# Patient Record
Sex: Female | Born: 1990 | Race: White | Hispanic: No | Marital: Married | Smoking: Never smoker
Health system: Southern US, Community
[De-identification: ages and names within clinical notes are randomized; demographics above are authoritative.]

## PROBLEM LIST (undated history)

## (undated) DIAGNOSIS — F419 Anxiety disorder, unspecified: Secondary | ICD-10-CM

## (undated) DIAGNOSIS — F329 Major depressive disorder, single episode, unspecified: Secondary | ICD-10-CM

## (undated) DIAGNOSIS — T7840XA Allergy, unspecified, initial encounter: Secondary | ICD-10-CM

## (undated) DIAGNOSIS — N200 Calculus of kidney: Secondary | ICD-10-CM

## (undated) DIAGNOSIS — E119 Type 2 diabetes mellitus without complications: Secondary | ICD-10-CM

## (undated) DIAGNOSIS — D649 Anemia, unspecified: Secondary | ICD-10-CM

## (undated) DIAGNOSIS — G43909 Migraine, unspecified, not intractable, without status migrainosus: Secondary | ICD-10-CM

## (undated) DIAGNOSIS — F32A Depression, unspecified: Secondary | ICD-10-CM

## (undated) HISTORY — DX: Anemia, unspecified: D64.9

## (undated) HISTORY — DX: Allergy, unspecified, initial encounter: T78.40XA

## (undated) HISTORY — DX: Major depressive disorder, single episode, unspecified: F32.9

## (undated) HISTORY — DX: Calculus of kidney: N20.0

## (undated) HISTORY — DX: Anxiety disorder, unspecified: F41.9

## (undated) HISTORY — DX: Migraine, unspecified, not intractable, without status migrainosus: G43.909

## (undated) HISTORY — DX: Depression, unspecified: F32.A

---

## 2010-02-07 ENCOUNTER — Emergency Department (INDEPENDENT_AMBULATORY_CARE_PROVIDER_SITE_OTHER)
Admission: EM | Admit: 2010-02-07 | Discharge: 2010-02-07 | Payer: Self-pay | Source: Home / Self Care | Admitting: Emergency Medicine

## 2010-02-07 DIAGNOSIS — R1031 Right lower quadrant pain: Secondary | ICD-10-CM

## 2010-02-07 DIAGNOSIS — M549 Dorsalgia, unspecified: Secondary | ICD-10-CM

## 2010-02-07 LAB — URINALYSIS, ROUTINE W REFLEX MICROSCOPIC
Ketones, ur: 40 mg/dL — AB
Nitrite: POSITIVE — AB
Protein, ur: 100 mg/dL — AB
Urobilinogen, UA: 1 mg/dL (ref 0.0–1.0)

## 2010-02-07 LAB — CBC
HCT: 36.7 % (ref 36.0–46.0)
MCH: 26.6 pg (ref 26.0–34.0)
MCHC: 33.5 g/dL (ref 30.0–36.0)
RDW: 14.4 % (ref 11.5–15.5)

## 2010-02-07 LAB — DIFFERENTIAL
Basophils Absolute: 0 10*3/uL (ref 0.0–0.1)
Lymphs Abs: 3.7 10*3/uL (ref 0.7–4.0)
Monocytes Absolute: 1.1 10*3/uL — ABNORMAL HIGH (ref 0.1–1.0)

## 2010-02-07 LAB — BASIC METABOLIC PANEL
Calcium: 9.4 mg/dL (ref 8.4–10.5)
Creatinine, Ser: 0.7 mg/dL (ref 0.4–1.2)
GFR calc non Af Amer: >60 mL/min (ref >60)
Glucose, Bld: 123 mg/dL — ABNORMAL HIGH (ref 70–99)
Sodium: 143 mEq/L (ref 135–145)

## 2010-02-07 LAB — URINE MICROSCOPIC-ADD ON

## 2011-02-03 ENCOUNTER — Emergency Department (HOSPITAL_BASED_OUTPATIENT_CLINIC_OR_DEPARTMENT_OTHER)
Admission: EM | Admit: 2011-02-03 | Discharge: 2011-02-03 | Disposition: A | Discharge status: Home / Self Care | Payer: Medicaid Other | Attending: Emergency Medicine | Admitting: Emergency Medicine

## 2011-02-03 ENCOUNTER — Encounter (HOSPITAL_BASED_OUTPATIENT_CLINIC_OR_DEPARTMENT_OTHER): Payer: Self-pay | Admitting: Family Medicine

## 2011-02-03 DIAGNOSIS — R319 Hematuria, unspecified: Secondary | ICD-10-CM | POA: Insufficient documentation

## 2011-02-03 DIAGNOSIS — R21 Rash and other nonspecific skin eruption: Secondary | ICD-10-CM | POA: Insufficient documentation

## 2011-02-03 DIAGNOSIS — Z2089 Contact with and (suspected) exposure to other communicable diseases: Secondary | ICD-10-CM | POA: Insufficient documentation

## 2011-02-03 MED ORDER — DIPHENHYDRAMINE HCL 25 MG PO CAPS
25.0000 mg | ORAL_CAPSULE | Freq: Once | ORAL | Status: DC
  Filled 2011-02-03: qty 1

## 2011-02-03 MED ORDER — DIPHENHYDRAMINE HCL 25 MG PO TABS: 25.0000 mg | ORAL_TABLET | Freq: Four times a day (QID) | ORAL | Status: AC

## 2011-02-03 MED ORDER — PERMETHRIN 5 % EX CREA: TOPICAL_CREAM | Freq: Once | CUTANEOUS | Status: AC

## 2011-02-03 NOTE — ED Provider Notes (Signed)
History     CSN: [id number]  Arrival date & time 02/03/11  4098   First MD Initiated Contact with Patient 02/03/11 [phone]      Chief Complaint  Patient presents with  . Rash    (Consider location/radiation/quality/duration/timing/severity/associated sxs/prior treatment) HPI Comments: Diffuse body itching for the past 2 weeks. Patient's roommate had scabies and was treated. Patient denies any fevers, vomiting, difficulty breathing or swallowing. Few scattered papules but no localized rash. No genital or mucous membrane involvement  Patient is a 21 y.o. female presenting with rash. The history is provided by the patient.  Rash  This is a new problem. The current episode started more than 1 week ago. The problem has not changed since onset.There has been no fever. The patient is experiencing no pain. Associated symptoms include itching. She has tried nothing for the symptoms.    History reviewed. No pertinent past medical history.  History reviewed. No pertinent past surgical history.  No family history on file.  History  Substance Use Topics  . Smoking status: Never Smoker   . Smokeless tobacco: Not on file  . Alcohol Use: No    OB History    Grav Para Term Preterm Abortions TAB SAB Ect Mult Living                  Review of Systems  Constitutional: Negative for fever, chills and activity change.  HENT: Negative for trouble swallowing.   Cardiovascular: Negative for chest pain.  Gastrointestinal: Negative for nausea and vomiting.  Genitourinary: Positive for hematuria. Negative for dysuria.  Skin: Positive for itching and rash.  Neurological: Negative for headaches.  Hematological: Negative for adenopathy.    Allergies  Review of patient's allergies indicates no known allergies.  Home Medications   Current Outpatient Rx  Name Route Sig Dispense Refill  . DIPHENHYDRAMINE HCL 25 MG PO TABS Oral Take 1 tablet (25 mg total) by mouth every 6 (six) hours. 20 tablet 0    . PERMETHRIN 5 % EX CREA Topical Apply topically once. 60 g 0    BP 139/72  Pulse 79  Temp(Src) 98.1 F (36.7 C) (Oral)  Resp 16  Ht 5\' 8"  (1.727 m)  Wt 215 lb (97.523 kg)  BMI 32.69 kg/m2  SpO2 100%  LMP 01/20/2011  Physical Exam  Constitutional: She is oriented to person, place, and time. She appears well-developed and well-nourished. No distress.  HENT:  Head: Normocephalic and atraumatic.  Mouth/Throat: Oropharynx is clear and moist. No oropharyngeal exudate.  Eyes: Conjunctivae are normal. Pupils are equal, round, and reactive to light.  Neck: Normal range of motion. Neck supple.  Cardiovascular: Normal rate, regular rhythm and normal heart sounds.   Pulmonary/Chest: Effort normal and breath sounds normal. No respiratory distress.  Abdominal: Soft. There is no tenderness. There is no rebound and no guarding.  Musculoskeletal: Normal range of motion. She exhibits no edema and no tenderness.  Neurological: She is alert and oriented to person, place, and time. No cranial nerve deficit.  Skin: Skin is warm. Rash noted.       Erythematous areas of scratching. Few scattered papules. No rash or burrows seen in intertriginous areas    ED Course  Procedures (including critical care time)  Labs Reviewed - No data to display No results found.   1. Scabies exposure       MDM  Diffuse body itching with exposure to scabies. Will treat. No urticaria, wheezing, airway involvement.  Benadryl prn.  Glynn Octave, MD 02/03/11 [phone]

## 2011-02-03 NOTE — ED Notes (Signed)
Pt c/o itching all over x 1 wk. Pt sts roommate had scabies.

## 2012-12-09 IMAGING — CT CT ABD-PELV W/O CM
2 of 4 series · 16 of 46 positions shown, 18 images · non-contrast
Comparison: None.

CLINICAL DATA: Severe right lower quadrant pain, spotting, nausea

CT ABDOMEN AND PELVIS WITHOUT CONTRAST
TECHNIQUE: Multidetector CT imaging of the abdomen and pelvis was
performed following the standard protocol without intravenous
contrast.

[Series 2: renal stone < 200 lbs 5.0 b31f · axial · 0.77mm/px · z∈[-504,-79]mm · 13 of 93 slices shown, 15 images]
[im 4/93  soft-tissue]
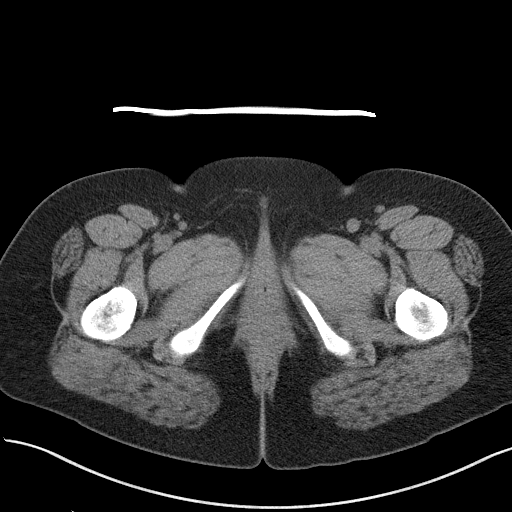
[im 4/93  bone]
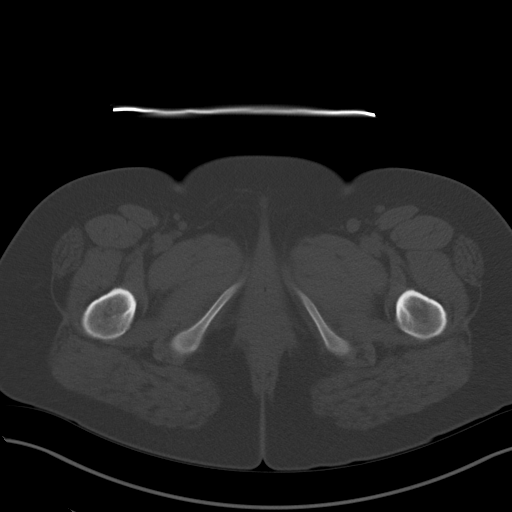
[im 12/93  soft-tissue]
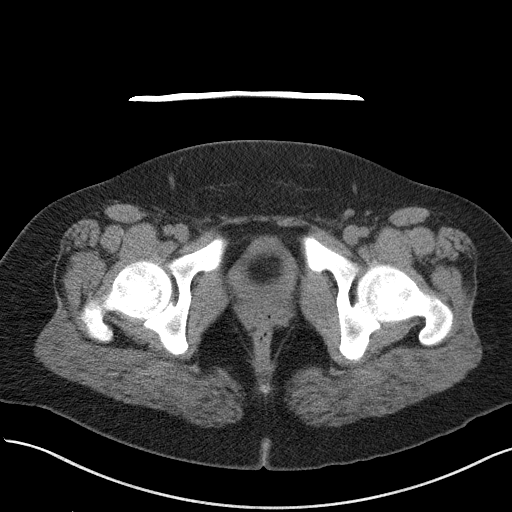
[im 19/93  soft-tissue]
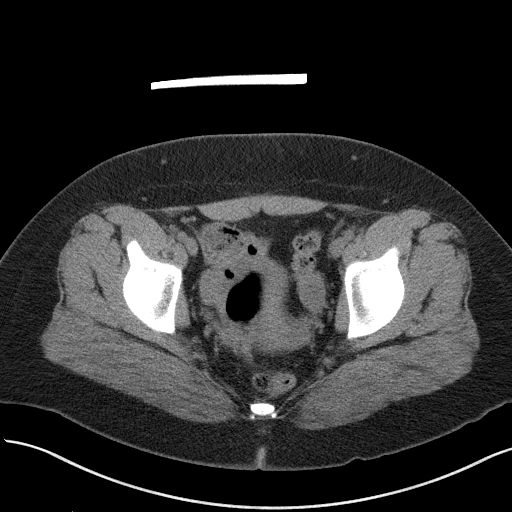
[im 26/93  soft-tissue]
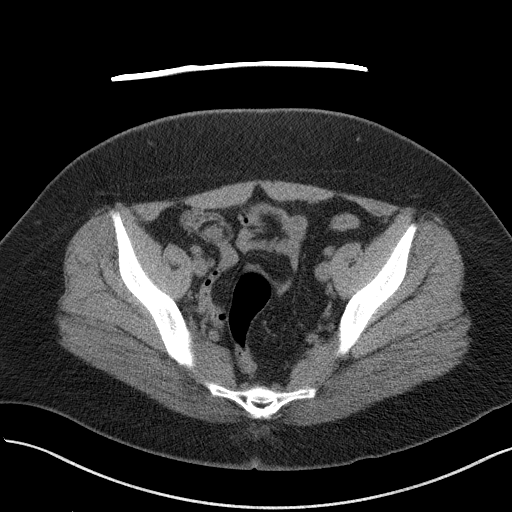
[im 34/93  soft-tissue]
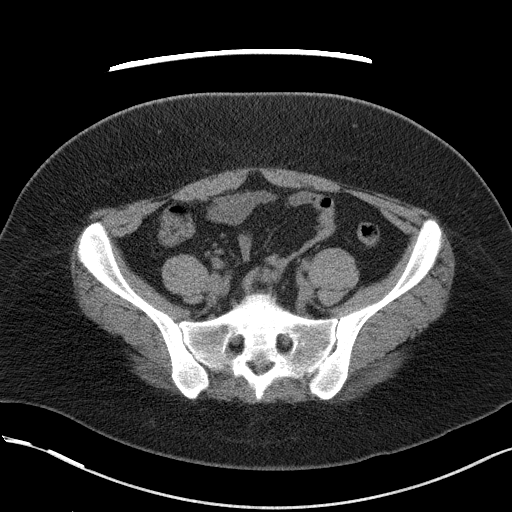
[im 41/93  soft-tissue]
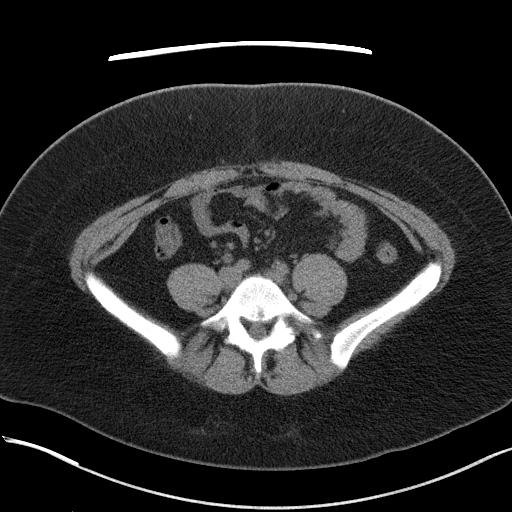
[im 48/93  soft-tissue]
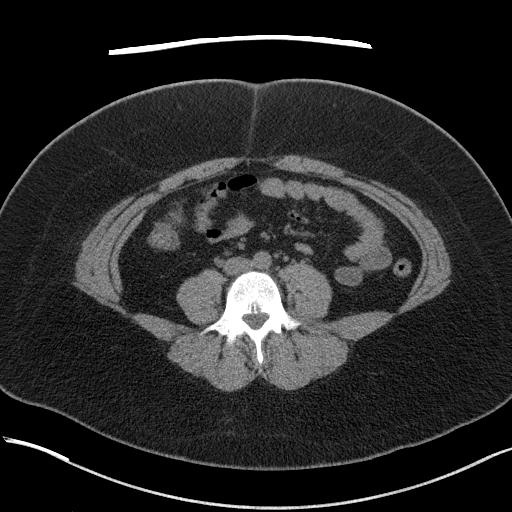
[im 52/93  soft-tissue]
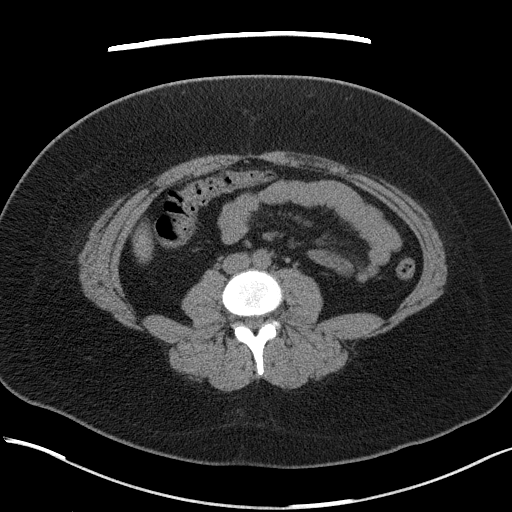
[im 59/93  soft-tissue]
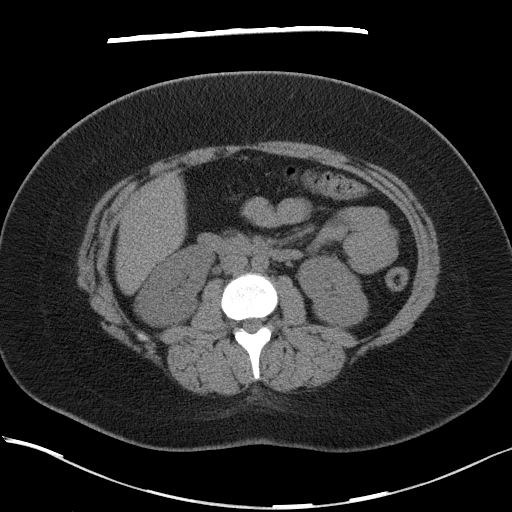
[im 59/93  bone]
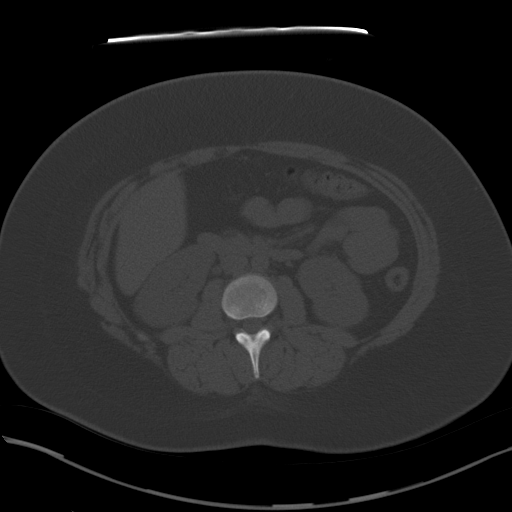
[im 67/93  soft-tissue]
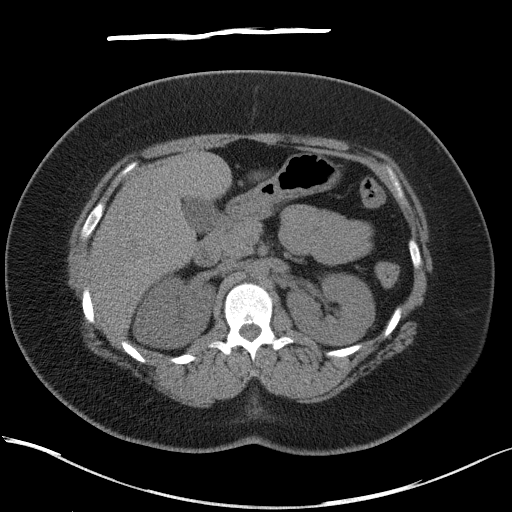
[im 74/93  soft-tissue]
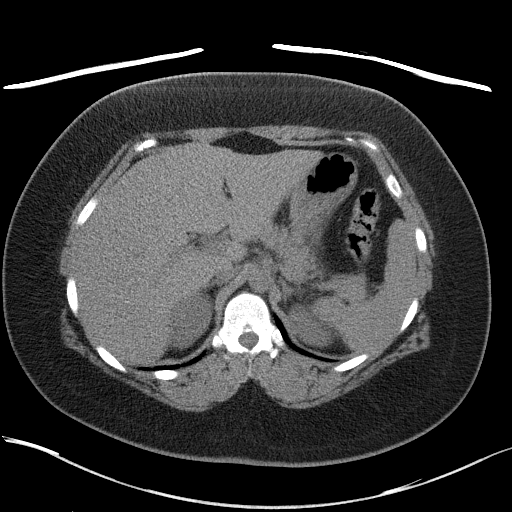
[im 81/93  soft-tissue]
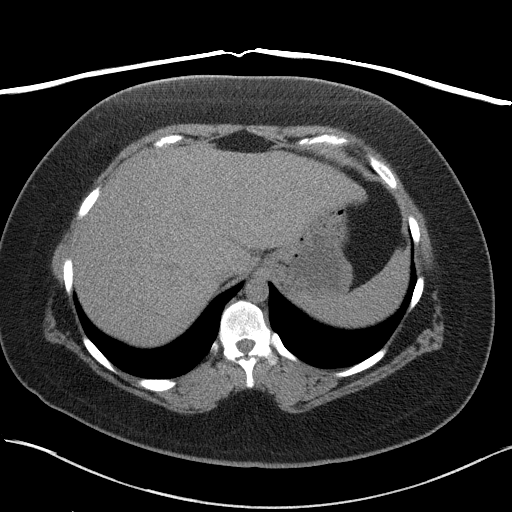
[im 89/93  soft-tissue]
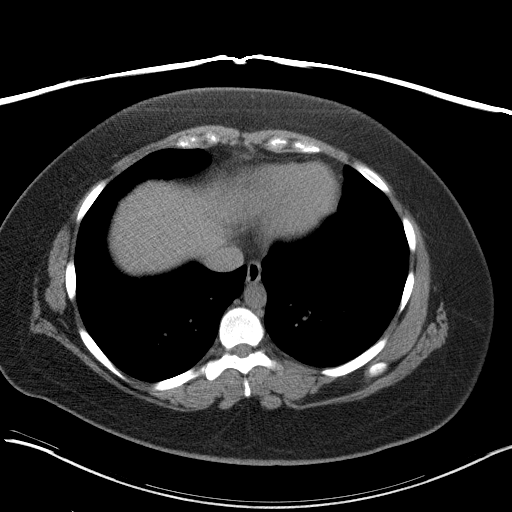

[Series 5: renal stone 3.0 coronal · coronal · 0.95mm/px · 3 of 87 slices shown]
[im 29/87  soft-tissue]
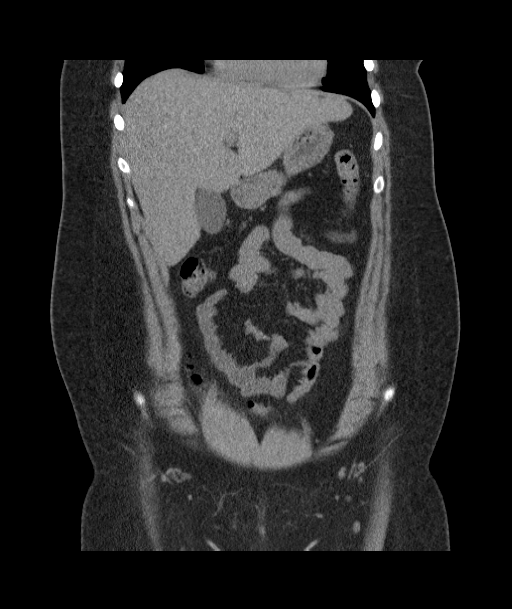
[im 39/87  soft-tissue]
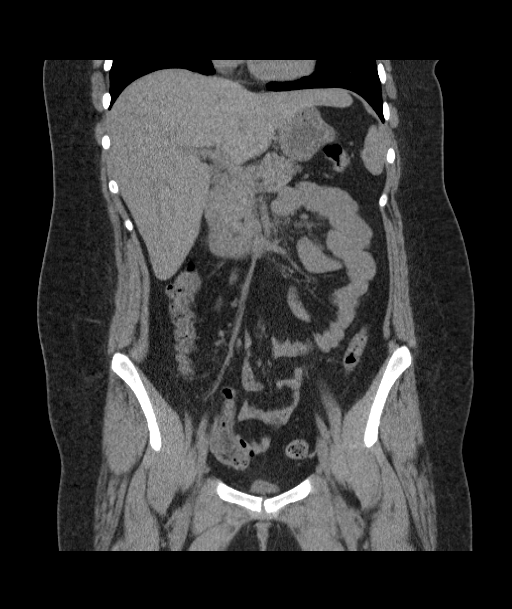
[im 48/87  soft-tissue]
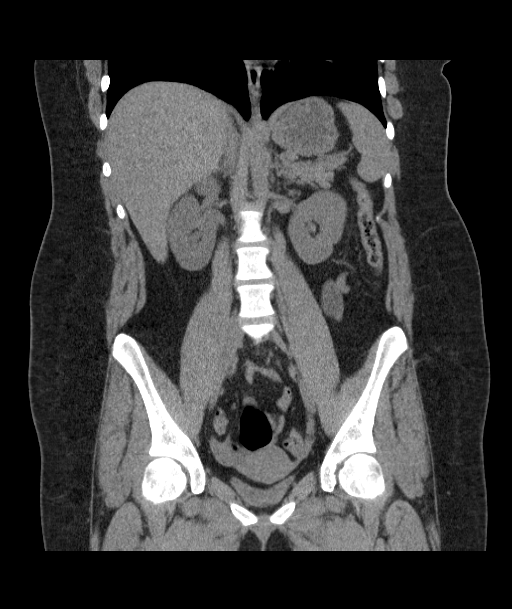

[16 of 46 positions shown; findings below may reference images not displayed]

FINDINGS: The lung bases are clear.  The liver is unremarkable in
the unenhanced state.  No calcified gallstones are seen.  The
pancreas is normal in size and the pancreatic duct is not dilated.
The adrenal glands and spleen are unremarkable.  The stomach is
decompressed.  No renal calculi are seen and there is no evidence
of hydronephrosis.  However, the right ureter is slightly more
prominent than the left.  In the region of the right ureter, very
near the right UV junction, there is a 4 mm calcification present
suspicious for a distal right ureteral calculus.  The urinary
bladder is unfortunately very decompressed.

The uterus is normal in size.  No adnexal lesion is seen.  No free
fluid is noted within the pelvis.  The appendix is not definitely
seen.  However there is no CT evidence of appendicitis.  The
terminal ileum is unremarkable.  No abnormality of the colon is
noted, with the cecum very low lying in the right lower anterior
pelvis.  No bony abnormality is noted.
IMPRESSION: 1.  There is slight fullness of the right ureter to a point of
probable low grade obstruction by a 4 mm distal right ureteral
calculus very near the right UV junction.  The urinary bladder is
unfortunately very decompressed.
2.  No other renal calculi are seen.
3.  The appendix is not visualized but there is no CT evidence of
appendicitis.
4.  No adnexal lesion or free fluid is seen.

## 2015-10-20 ENCOUNTER — Encounter: Payer: Self-pay | Admitting: Family Medicine

## 2015-10-20 ENCOUNTER — Ambulatory Visit (INDEPENDENT_AMBULATORY_CARE_PROVIDER_SITE_OTHER): Payer: BC Managed Care – PPO | Admitting: Family Medicine

## 2015-10-20 VITALS — BP 124/78 | HR 91 | Temp 98.4°F | Resp 12 | Ht 68.0 in | Wt 236.0 lb

## 2015-10-20 DIAGNOSIS — Z6835 Body mass index (BMI) 35.0-35.9, adult: Secondary | ICD-10-CM

## 2015-10-20 DIAGNOSIS — F419 Anxiety disorder, unspecified: Secondary | ICD-10-CM | POA: Insufficient documentation

## 2015-10-20 DIAGNOSIS — E669 Obesity, unspecified: Secondary | ICD-10-CM | POA: Insufficient documentation

## 2015-10-20 DIAGNOSIS — F411 Generalized anxiety disorder: Secondary | ICD-10-CM | POA: Diagnosis not present

## 2015-10-20 DIAGNOSIS — Z6838 Body mass index (BMI) 38.0-38.9, adult: Secondary | ICD-10-CM | POA: Insufficient documentation

## 2015-10-20 DIAGNOSIS — G4700 Insomnia, unspecified: Secondary | ICD-10-CM

## 2015-10-20 MED ORDER — FLUOXETINE HCL 20 MG PO CAPS: 20.0000 mg | ORAL_CAPSULE | Freq: Every day | ORAL | 2 refills | Status: DC

## 2015-10-20 NOTE — Progress Notes (Signed)
HPI:   [name] is a 25 y.o. female, who is here today to establish care with me.  Former PCP: N/A Last preventive routine visit: a few years ago. She has not had a pap smear.   Concerns today: anxiety.  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  She has nausea and vomiting when she feels "extremely nervous" She is reporting Hx of depression since she was in high school, at that time she had suicidal thoughts, denies any Hx of suicidal attempt.  Hx of psychiatric hospitalizations or ER visits: Denies Any hx of bipolar disorder: Denies. Suicidal thoughts: Denies. Insomnia: yes, melatonin 5 mg at bedtime helps, exacerbated by anxiety/stress. Tobacco use: No Alcohol abuse: Denies Hx of illicit drug RUE:[id number] FHx for psychiatric disorders: Mother depressed and anxious but she has not been diagnosed.  Medications in the past: None   She lives with husband and a friend. She does not exercise regularly and does not follow a healthful diet consistently.  She is sexually active, not on birth control.   Review of Systems  Constitutional: Negative for activity change, appetite change, fatigue, fever and unexpected weight change.  HENT: Negative for mouth sores, nosebleeds and trouble swallowing.   Eyes: Negative for redness and visual disturbance.  Respiratory: Negative for cough, shortness of breath and wheezing.   Cardiovascular: Negative for chest pain, palpitations and leg swelling.  Gastrointestinal: Positive for nausea and vomiting. Negative for abdominal pain.       Negative for changes in bowel habits.  Musculoskeletal: Negative for back pain and myalgias.  Neurological: Negative for seizures, syncope, weakness, numbness and headaches.  Psychiatric/Behavioral: Positive  for sleep disturbance. Negative for confusion, hallucinations, self-injury and suicidal ideas. The patient is nervous/anxious.       No current outpatient prescriptions on file prior to visit.   No current facility-administered medications on file prior to visit.      Past Medical History:  Diagnosis Date  . Anxiety   . Depression   . Migraine headache   . Nephrolithiasis    No Known Allergies  Family History  Problem Relation Age of Onset  . Mental illness Mother     Social History   Social History  . Marital status: Married    Spouse name: N/A  . Number of children: N/A  . Years of education: N/A   Social History Main Topics  . Smoking status: Never Smoker  . Smokeless tobacco: Never Used  . Alcohol use No  . Drug use: No  . Sexual activity: Yes    Birth control/ protection: None   Other Topics Concern  . None   Social History Narrative  . None    Vitals:   10/20/15 1454  BP: 124/78  Pulse: 91  Resp: 12  Temp: 98.4 F (36.9 C)    Body mass index is 35.88 kg/m.     Physical Exam  Nursing note and vitals reviewed. Constitutional: She is oriented to person, place, and time. She appears well-developed. No distress.  HENT:  Head: Atraumatic.  Mouth/Throat: Oropharynx is clear and moist and mucous membranes are normal.  Eyes: Conjunctivae and EOM are normal. Pupils are equal, round, and reactive to light.  Neck: No thyroid mass and no thyromegaly present.  Cardiovascular: Normal rate and  regular rhythm.   No murmur (? soft SEM RUSB) heard. Pulses:      Dorsalis pedis pulses are 2+ on the right side, and 2+ on the left side.  Respiratory: Effort normal and breath sounds normal. No respiratory distress.  GI: Soft. She exhibits no mass. There is no hepatomegaly. There is no tenderness.  Musculoskeletal: She exhibits no edema or tenderness.  Neurological: She is alert and oriented to person, place, and time. She has normal strength. Coordination  normal.  Skin: Skin is warm. No erythema.  Psychiatric: Her speech is normal. Her mood appears anxious. Her affect is labile. Cognition and memory are normal. She expresses no suicidal ideation. She expresses no suicidal plans.  Well groomed, good eye contact.      ASSESSMENT AND PLAN:     Angela Powers was seen today for establish care.  Diagnoses and all orders for this visit:  Insomnia, unspecified type  Otherwise well controlled with Melatonin. Good sleep hygiene. Monitor for changes when starting SSRI.   BMI 35.0-35.9,adult  We discussed benefits of wt loss as well as adverse effects of obesity. Consistency with healthy diet and physical activity recommended. Small and frequent small meals as well as daily brisk walking for 15-30 min as tolerated.  Generalized anxiety disorder  After discussion of a few treatment options, she agrees with starting Fluoxetine. Some side effects discussed. Avoid pregnancy for now, we discussed some adverse effects of this medication in case of pregnancy. Instructed about warning signs. F/U in 6-8 weeks,before if needed.  -     FLUoxetine (PROZAC) 20 MG capsule; Take 1 capsule (20 mg total) by mouth daily.    -Recommend gyn evaluation for preventive care and counseling about pregnancy since she is interested in getting pregnant.  Daily Folic acid 1 mg.         Betty G. [place], MD  [facility]. [place] office.

## 2015-10-20 NOTE — Patient Instructions (Addendum)
A few things to remember from today's visit:   Generalized anxiety disorder  Insomnia, unspecified type  Monitor for warning signs as we discussed. Regular exercise and a healthy diet recommended.  Daily probiotic might help as well as gluten-free diet.  Please be sure medication list is accurate. If a new problem present, please set up appointment sooner than planned today.

## 2015-10-24 ENCOUNTER — Encounter: Payer: Self-pay | Admitting: Family Medicine

## 2015-10-29 ENCOUNTER — Encounter: Payer: Self-pay | Admitting: Family Medicine

## 2015-10-29 ENCOUNTER — Ambulatory Visit (INDEPENDENT_AMBULATORY_CARE_PROVIDER_SITE_OTHER): Payer: BC Managed Care – PPO | Admitting: Family Medicine

## 2015-10-29 VITALS — BP 132/80 | HR 78 | Resp 12 | Ht 68.0 in | Wt 233.5 lb

## 2015-10-29 DIAGNOSIS — F411 Generalized anxiety disorder: Secondary | ICD-10-CM | POA: Diagnosis not present

## 2015-10-29 MED ORDER — ESCITALOPRAM OXALATE 10 MG PO TABS: 10.0000 mg | ORAL_TABLET | Freq: Every day | ORAL | 0 refills | Status: DC

## 2015-10-29 NOTE — Progress Notes (Signed)
HPI:  ACUTE VISIT:  Chief Complaint  Patient presents with  . Follow-up    medication reaction    [name] is a 25 y.o. female, who is here today with her husband complaining of medication side effects.  Last seen on 10/20/15, when Prozac was started. She was tolerating medication well until 2 days ago when she started with more frequent episodes of panic attacks and crying spells with no evident reason.  She denies any worsening of insomnia or maniac like symptoms.  Yesterday she thought about driving out of the road, suicidal thought. She denies ideation or a plan. No guns in the house an she stays with her husband all the time.  During panic attacks she feels like she cannot breathe, heart racing, and "panicky." Symptoms lasted a few minutes.  She has had history of depression during high school with suicidal thoughts, she denies any history of suicidal attempt or psychiatric hospitalizations.     Review of Systems  Constitutional: Negative for activity change, appetite change, fatigue and unexpected weight change.  HENT: Negative for facial swelling, mouth sores and trouble swallowing.   Respiratory: Positive for shortness of breath. Negative for cough and wheezing.   Cardiovascular: Positive for palpitations. Negative for chest pain and leg swelling.  Gastrointestinal: Negative for abdominal pain, nausea and vomiting.  Neurological: Negative for dizziness, tremors, syncope, weakness, numbness and headaches.  Psychiatric/Behavioral: Negative for agitation, confusion, hallucinations, self-injury and sleep disturbance. The patient is nervous/anxious.       Current Outpatient Prescriptions on File Prior to Visit  Medication Sig Dispense Refill  . Melatonin 5 MG TABS Take 1 tablet by mouth at bedtime.     No current facility-administered medications on file prior to visit.      Past Medical History:  Diagnosis Date  . Anxiety   .  Depression   . Migraine headache   . Nephrolithiasis    No Known Allergies  Social History   Social History  . Marital status: Married    Spouse name: N/A  . Number of children: N/A  . Years of education: N/A   Social History Main Topics  . Smoking status: Never Smoker  . Smokeless tobacco: Never Used  . Alcohol use No  . Drug use: No  . Sexual activity: Yes    Birth control/ protection: None   Other Topics Concern  . None   Social History Narrative  . None    Vitals:   10/29/15 1000  BP: 132/80  Pulse: 78  Resp: 12    O2 sat 98% at RA.  Body mass index is 35.5 kg/m.    Physical Exam  Nursing note and vitals reviewed. Constitutional: She is oriented to person, place, and time. She appears well-developed. No distress.  Eyes: Conjunctivae are normal.  Cardiovascular: Normal rate and regular rhythm.   No murmur heard. Respiratory: Effort normal and breath sounds normal. No respiratory distress.  Neurological: She is alert and oriented to person, place, and time. She has normal strength. Coordination and gait normal.  Skin: Skin is warm. No erythema.  Psychiatric: Her speech is normal. Her mood appears anxious. Her affect is labile. Cognition and memory are normal.  Well groomed, good eye contact.      ASSESSMENT AND PLAN:     Angela Powers was seen today for follow-up.  Diagnoses and all orders for this visit:  Generalized anxiety disorder -     escitalopram (LEXAPRO) 10 MG tablet; Take 1  tablet (10 mg total) by mouth daily.   She denies any suicidal plan or ideation. We discussed other treatment options that included psychiatry/psychology evaluation. She agrees with trying other medication, Lexapro 10 mg daily. We discussed in detail side effects and warning signs as well as when she should seek immediate medical attention. Husband also voices understanding. I will see her back in 2-3 weeks before if needed. Regular exercise and a healthy diet also  encouraged, they might help.    Return in about 3 weeks (around 11/19/2015) for 2-3 wks anxiety/depression.       Betty G. [place], MD  [facility]. [place] office.

## 2015-10-29 NOTE — Progress Notes (Signed)
Pre visit review using our clinic review tool, if applicable. No additional management support is needed unless otherwise documented below in the visit note. 

## 2015-10-29 NOTE — Patient Instructions (Addendum)
A few things to remember from today's visit:   Generalized anxiety disorder - Plan: escitalopram (LEXAPRO) 10 MG tablet  Stop Prozac.  Regular exercise ,gluten free diet, daily Probiotic may also help.  This seek immediate medical attention if suicidal ideation.  Please be sure medication list is accurate. If a new problem present, please set up appointment sooner than planned today.

## 2015-11-19 ENCOUNTER — Ambulatory Visit (INDEPENDENT_AMBULATORY_CARE_PROVIDER_SITE_OTHER): Payer: BC Managed Care – PPO | Admitting: Family Medicine

## 2015-11-19 ENCOUNTER — Encounter: Payer: Self-pay | Admitting: Family Medicine

## 2015-11-19 VITALS — BP 128/80 | HR 80 | Resp 12 | Ht 68.0 in | Wt 233.5 lb

## 2015-11-19 DIAGNOSIS — F411 Generalized anxiety disorder: Secondary | ICD-10-CM

## 2015-11-19 MED ORDER — ESCITALOPRAM OXALATE 10 MG PO TABS: 10.0000 mg | ORAL_TABLET | Freq: Every day | ORAL | 1 refills | Status: DC

## 2015-11-19 NOTE — Patient Instructions (Addendum)
A few things to remember from today's visit:   Generalized anxiety disorder - Plan: escitalopram (LEXAPRO) 10 MG tablet   Please be sure medication list is accurate. If a new problem present, please set up appointment sooner than planned today.

## 2015-11-19 NOTE — Progress Notes (Signed)
HPI:   [name] is a 25 y.o. female, who is here today with her husband to follow on anxiety.  I saw her last on 10/29/2015 because worsening anxiety symptoms after starting Fluoxetine, which was started on 10/29/2015.  She is currently taking Lexapro 10 mg, tolerating medication well, she denies any suicidal thoughts, which she has had in the past. She is reporting greatly improvement of symptoms, she tells me that she quit her job a few days ago. 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 She is planning on starting a healthy diet and regular exercise. 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  Denies changes in sleep pattern or maniac like symptoms.  Concerns today: None     Review of Systems  Constitutional: Negative for appetite change and fatigue.  HENT: Negative for mouth sores and trouble swallowing.   Respiratory: Negative for cough, shortness of breath and wheezing.   Cardiovascular: Negative for chest pain and leg swelling.  Gastrointestinal: Negative for abdominal pain, nausea and vomiting.       No changes in bowel habits.  Neurological: Negative for tremors, weakness and headaches.  Psychiatric/Behavioral: Negative for confusion and hallucinations. The patient is nervous/anxious.       Current Outpatient Prescriptions on File Prior to Visit  Medication Sig Dispense Refill  . Melatonin 5 MG TABS Take 1 tablet by mouth at bedtime.     No current facility-administered medications on file prior to visit.      Past Medical History:  Diagnosis Date  . Anxiety   . Depression   . Migraine headache   . Nephrolithiasis    No Known Allergies  Social History   Social History  . Marital status: Married    Spouse name: N/A  . Number of children: N/A  .  Years of education: N/A   Social History Main Topics  . Smoking status: Never Smoker  . Smokeless tobacco: Never Used  . Alcohol use No  . Drug use: No  . Sexual activity: Yes    Birth control/ protection: None   Other Topics Concern  . None   Social History Narrative  . None    Vitals:   11/19/15 1548  BP: 128/80  Pulse: 80  Resp: 12   Body mass index is 35.5 kg/m.      Physical Exam  Nursing note and vitals reviewed. Constitutional: She is oriented to person, place, and time. She appears well-developed. No distress.  HENT:  Mouth/Throat: Oropharynx is clear and moist and mucous membranes are normal.  Eyes: Conjunctivae and EOM are normal.  Cardiovascular: Normal rate and regular rhythm.   Respiratory: Effort normal and breath sounds normal. No respiratory distress.  Neurological: She is alert and oriented to person, place, and time. She has normal strength. Coordination and gait normal.  Skin: Skin is warm. No erythema.  Psychiatric: She has a normal mood and affect. Her speech is normal. Cognition and memory are normal.  Adequately groomed, good eye contact.      ASSESSMENT AND PLAN:     [name] was seen today for follow-up.  Diagnoses and all orders for this visit:  Generalized anxiety disorder -     escitalopram (LEXAPRO) 10 MG tablet; Take 1 tablet (10 mg total) by mouth daily.  Improved. She will continue Lexapro 10 mg daily. Instructed about warning signs. Regular physical activity and a healthful diet recommended. Follow-up in 3 months, before if needed.     -[name] was advised to return sooner than planned today if new concerns arise.       Arshia Spellman G. [place], MD  [facility]. [place] office.

## 2015-12-07 ENCOUNTER — Ambulatory Visit: Payer: BC Managed Care – PPO | Admitting: Family Medicine

## 2016-02-22 ENCOUNTER — Encounter: Payer: Self-pay | Admitting: Family Medicine

## 2016-02-22 ENCOUNTER — Other Ambulatory Visit: Payer: Self-pay

## 2016-02-22 ENCOUNTER — Ambulatory Visit (INDEPENDENT_AMBULATORY_CARE_PROVIDER_SITE_OTHER): Payer: Self-pay | Admitting: Family Medicine

## 2016-02-22 VITALS — BP 122/78 | HR 94 | Ht 68.0 in | Wt 250.1 lb

## 2016-02-22 DIAGNOSIS — F411 Generalized anxiety disorder: Secondary | ICD-10-CM

## 2016-02-22 DIAGNOSIS — Z6838 Body mass index (BMI) 38.0-38.9, adult: Secondary | ICD-10-CM

## 2016-02-22 DIAGNOSIS — Z021 Encounter for pre-employment examination: Secondary | ICD-10-CM

## 2016-02-22 MED ORDER — ESCITALOPRAM OXALATE 10 MG PO TABS: 10.0000 mg | ORAL_TABLET | Freq: Every day | ORAL | 2 refills | Status: DC

## 2016-02-22 NOTE — Patient Instructions (Addendum)
A few things to remember from today's visit:   Routine physical examination  Generalized anxiety disorder - Plan: escitalopram (LEXAPRO) 10 MG tablet  You need to arrange for your pap smear. Please be sure medication list is accurate. If a new problem present, please set up appointment sooner than planned today.

## 2016-02-22 NOTE — Progress Notes (Signed)
Pre visit review using our clinic review tool, if applicable. No additional management support is needed unless otherwise documented below in the visit note. 

## 2016-02-22 NOTE — Progress Notes (Signed)
Angela Powers is a 26 y.o.female, who is here today with her husband to follow on anxiety.  Initial visit 10/20/15 when Prozac was started because worsening anxiety, attributed to stress at work. 10/29/15 Prozac discontinued because side effects, worsening anxiety and crying spells.  Currently she is on Lexapro, which she has been taking sine 10/29/15. She is tolerating medication well, no side effects reported.  Denies depression or anxiety symptoms. Dealing better with stress.  No changes in sleep. She denies suicidal thoughts.  -She has gained some weight, she is not exercising regularly and she is not following a healthy diet consistently.   -Planning on starting a new job in 2 days, needs a current physical.She did not receive a form from employer. 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  She has had a TB test within the past year, so she doe snot need one. She has no limitations in regard to physical activity.    Review of Systems  Constitutional: Negative for activity change, appetite change and fatigue.  HENT: Negative for hearing loss, mouth sores, sore throat and trouble swallowing.   Eyes: Negative for pain and visual disturbance.       Wears eye glasses.  Respiratory: Negative for cough, shortness of breath and wheezing.   Cardiovascular: Negative for chest pain, palpitations and leg swelling.  Gastrointestinal: Negative for abdominal pain, nausea and vomiting.  Genitourinary: Negative for decreased urine volume, dysuria and hematuria.  Musculoskeletal: Negative for back pain, gait problem and myalgias.  Skin: Negative for rash.  Neurological: Negative for dizziness, syncope, weakness and headaches.  Hematological: Negative for adenopathy. Does not bruise/bleed easily.  Psychiatric/Behavioral: Negative for confusion, hallucinations and suicidal ideas. The patient is not nervous/anxious.        Current Outpatient Prescriptions on File  Prior to Visit  Medication Sig Dispense Refill  . Melatonin 5 MG TABS Take 1 tablet by mouth at bedtime.     No current facility-administered medications on file prior to visit.      Past Medical History:  Diagnosis Date  . Anxiety   . Depression   . Migraine headache   . Nephrolithiasis     No Known Allergies  Social History   Social History  . Marital status: Married    Spouse name: N/A  . Number of children: N/A  . Years of education: N/A   Social History Main Topics  . Smoking status: Never Smoker  . Smokeless tobacco: Never Used  . Alcohol use No  . Drug use: No  . Sexual activity: Yes    Birth control/ protection: None   Other Topics Concern  . None   Social History Narrative  . None    Vitals:   02/22/16 1517  BP: 122/78  Pulse: 94   Body mass index is 38.03 kg/m.  Wt Readings from Last 3 Encounters:  02/22/16 250 lb 2 oz (113.5 kg)  11/19/15 233 lb 8 oz (105.9 kg)  10/29/15 233 lb 8 oz (105.9 kg)    Physical Exam  Nursing note and vitals reviewed. Constitutional: She is oriented to person, place, and time. She appears well-developed. No distress.  HENT:  Head: Atraumatic.  Right Ear: Hearing, tympanic membrane, external ear and ear canal normal.  Left Ear: Hearing, tympanic membrane, external ear and ear canal normal.  Mouth/Throat: Uvula is midline, oropharynx is clear and moist and mucous membranes are normal.  Eyes: Conjunctivae and EOM are normal.  Cardiovascular: Normal rate and  regular rhythm.   No murmur heard. Respiratory: Effort normal and breath sounds normal. No respiratory distress.  GI: Soft. She exhibits no mass. There is no hepatomegaly. There is no tenderness.  Musculoskeletal: She exhibits no edema.  No major deformity or signs of synovitis appreciated.  Neurological: She is alert and oriented to person, place, and time. She has normal strength. Coordination and gait normal.  Skin: Skin is warm. No erythema.    Psychiatric: She has a normal mood and affect. Her speech is normal. Cognition and memory are normal.  Well groomed, good eye contact.      Angela Powers was seen today for follow-up.  Diagnoses and all orders for this visit:  Generalized anxiety disorder  Well controlled. No changes in current management. F/U in 6 months.  -     escitalopram (LEXAPRO) 10 MG tablet; Take 1 tablet (10 mg total) by mouth daily.  BMI 38.0-38.9,adult  We discussed benefits of wt loss as well as adverse effects of obesity. Consistency with healthy diet and physical activity recommended. Daily brisk walking for 15-30 min as tolerated. She has no health insurance at this time, so we will hold on lab work (FG and FLP)  Physical exam, pre-employment  She does not have physical or mental limitation for performing a job that entails taking care of children. Letter for employer given.    She was instructed to schedule gyn examination, she is due for pap smear.     Naseem Adler G. [place], MD  [facility]. [place] office.

## 2016-08-19 ENCOUNTER — Ambulatory Visit: Payer: Self-pay | Admitting: Family Medicine

## 2016-08-23 ENCOUNTER — Other Ambulatory Visit: Payer: Self-pay | Admitting: Family Medicine

## 2016-08-23 DIAGNOSIS — F411 Generalized anxiety disorder: Secondary | ICD-10-CM

## 2016-09-13 NOTE — Progress Notes (Signed)
HPI:   [name] is a 26 y.o. female, who is here today to follow on some chronic medical problems.  She was last seen on 02/22/16.  Anxiety and depression, she is on Lexapro 10 mg daily, started on 10/29/15. 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 She denies any side effect from medication. She denies depressed mood or suicidal thoughts.  She is living with her husband.   Insomnia: She would like to stop Melatonin,she feels drowsy next day. Last night she didn't take it and was able to sleep for about 8 hours. She felt rested today when she first got up.  She thinks part of the problem was anxiety and stress generated by her former employer.   Obesity: She has been more active during the day at work but not exercising regularly. She is also eating healthier.    Review of Systems  Constitutional: Negative for activity change, appetite change, fatigue and unexpected weight change.  HENT: Negative for mouth sores, nosebleeds and sore throat.   Respiratory: Negative for chest tightness, shortness of breath and wheezing.   Cardiovascular: Negative for chest pain, palpitations and leg swelling.  Gastrointestinal: Negative for abdominal pain, nausea and vomiting.       No changes in bowel habits.  Endocrine: Negative for cold intolerance and heat intolerance.  Genitourinary: Negative for decreased urine volume and hematuria.  Musculoskeletal: Negative for gait problem and myalgias.  Neurological: Negative for dizziness, tremors and headaches.  Psychiatric/Behavioral: Negative for confusion, hallucinations and suicidal ideas. The patient is nervous/anxious.       No current outpatient prescriptions on file prior to visit.   No current facility-administered medications on file prior to visit.      Past Medical History:  Diagnosis Date  . Anxiety   . Depression   . Migraine headache   .  Nephrolithiasis    No Known Allergies  Social History   Social History  . Marital status: Married    Spouse name: N/A  . Number of children: N/A  . Years of education: N/A   Social History Main Topics  . Smoking status: Never Smoker  . Smokeless tobacco: Never Used  . Alcohol use No  . Drug use: No  . Sexual activity: Yes    Birth control/ protection: None   Other Topics Concern  . None   Social History Narrative  . None    Vitals:   09/14/16 1626  BP: 132/78  Resp: 12  Temp: 98.5 F (36.9 C)   Body mass index is 36.8 kg/m.  Wt Readings from Last 3 Encounters:  09/14/16 242 lb (109.8 kg)  02/22/16 250 lb 2 oz (113.5 kg)  11/19/15 233 lb 8 oz (105.9 kg)     Physical Exam  Nursing note and vitals reviewed. Constitutional: She is oriented to person, place, and time. She appears well-developed. No distress.  HENT:  Head: Normocephalic and atraumatic.  Mouth/Throat: Oropharynx is clear and moist and mucous membranes are normal.  Eyes: Pupils are equal, round, and reactive to light. Conjunctivae are normal.  Cardiovascular: Normal rate and regular rhythm.   No murmur heard. Pulses:      Dorsalis pedis pulses are 2+ on the right side, and 2+ on the left side.  Respiratory: Effort normal and breath sounds normal. No respiratory distress.  GI: Soft. She exhibits no mass. There is no hepatomegaly. There is no tenderness.  Musculoskeletal:  She exhibits no edema or tenderness.  Lymphadenopathy:    She has no cervical adenopathy.  Neurological: She is alert and oriented to person, place, and time. She has normal strength. Coordination and gait normal.  Skin: Skin is warm. No erythema.  Psychiatric: Her mood appears anxious. Cognition and memory are normal. She expresses no suicidal ideation.  Fairly groomed, good eye contact.    ASSESSMENT AND PLAN:   [name] was seen today for follow-up.  Diagnoses and all orders for this visit:  Insomnia, unspecified  type  Continue nonpharmacologic treatment. Good sleep hygiene discussed. Follow-up as needed.  BMI 38.0-38.9,adult  She has lost about 8 pounds since her last office visit in February 2018. We discussed benefits of wt loss as well as adverse effects of obesity. Consistency with healthy diet and physical activity recommended.  Generalized anxiety disorder  Well controlled. No changes in current management. Follow-up in 6 months, before if needed.  -     escitalopram (LEXAPRO) 10 MG tablet; Take 1 tablet (10 mg total) by mouth daily.  Healthcare maintenance  She is not on OCP. Currently she is on Folic acid supplementation.  States that she is not trying to get pregnant but she is fine it it happens. Family planning encouraged, also side effects of Lexapro in case of pregnancy discussed.Recommend establishing with gynecologists. She is also overdue for her gynecologic preventive care.     -[name] was advised to return sooner than planned today if new concerns arise.       Devaeh Amadi G. [place], MD  [facility]. [place] office.

## 2016-09-14 ENCOUNTER — Encounter: Payer: Self-pay | Admitting: Family Medicine

## 2016-09-14 ENCOUNTER — Ambulatory Visit (INDEPENDENT_AMBULATORY_CARE_PROVIDER_SITE_OTHER): Payer: Managed Care, Other (non HMO) | Admitting: Family Medicine

## 2016-09-14 VITALS — BP 132/78 | Temp 98.5°F | Resp 12 | Ht 68.0 in | Wt 242.0 lb

## 2016-09-14 DIAGNOSIS — Z0000 Encounter for general adult medical examination without abnormal findings: Secondary | ICD-10-CM

## 2016-09-14 DIAGNOSIS — F411 Generalized anxiety disorder: Secondary | ICD-10-CM

## 2016-09-14 DIAGNOSIS — G4700 Insomnia, unspecified: Secondary | ICD-10-CM | POA: Diagnosis not present

## 2016-09-14 DIAGNOSIS — Z6838 Body mass index (BMI) 38.0-38.9, adult: Secondary | ICD-10-CM

## 2016-09-14 MED ORDER — ESCITALOPRAM OXALATE 10 MG PO TABS: 10.0000 mg | ORAL_TABLET | Freq: Every day | ORAL | 2 refills | Status: DC

## 2016-09-14 NOTE — Patient Instructions (Addendum)
A few things to remember from today's visit:   BMI 38.0-38.9,adult  Generalized anxiety disorder - Plan: escitalopram (LEXAPRO) 10 MG tablet  Insomnia, unspecified type  Wt Readings from Last 3 Encounters:  09/14/16 242 lb (109.8 kg)  02/22/16 250 lb 2 oz (113.5 kg)  11/19/15 233 lb 8 oz (105.9 kg)     ? What can I do to sleep better?   Improving your sleep habits is a good start.   Medical or psychiatric conditions might be making your insomnia worse.  Medicine might help, but you shouldn't use sleeping pills long term.   Some people need more sleep than others.   Sleep usually occurs in two- to three-hour cycles, so it is important to get at least three uninterrupted hours of sleep.  The following tips can help you develop better sleep habits: Go to bed and wake up at the same time each day Lie down to sleep only when sleepy.  Use bedroom for sleep and sex only. Don't do things in bed that might keep you awake, like watching television, reading, talking on the phone, or worrying Avoid caffeine, nicotine, or alcohol for at least four to six hours beforebedtime\ls1Avoid strenuous exercise within four hours of bedtime. Avoid daytime napping. Relax before going to bed. Avoid eating large meals or drinking a lot of water or other liquids in the evening. Keep the bedroom a comfortable temperature. Use earplugs if noise is a problem. Expose yourself to daytime light for at least 30 minutes each morning  Please be sure medication list is accurate. If a new problem present, please set up appointment sooner than planned today.

## 2017-03-14 ENCOUNTER — Ambulatory Visit: Payer: Self-pay | Admitting: Family Medicine

## 2017-03-19 NOTE — Progress Notes (Signed)
HPI:   AngelaAngela Powers is a 27 y.o. female, who is here today for 6 months follow up.   She was last seen in 09/2016.  Anxiety on Lexapro 10 mg daily. Symptoms still well controlled. She deals with stress better than she did before starting medication. She denies suicidal thoughts or depressed mood.  Hyperglycemia: She had a glucose 123 in 2012 and we have not followed on this problem. She is not exercising regularly and has not followed a healthy diet. She has gained wt since her last visit.  Denies abdominal pain, nausea,vomiting,polyuria, or polyphagia. + Polydipsia.     Review of Systems  Constitutional: Negative for activity change, appetite change, fatigue and fever.  HENT: Negative for mouth sores, nosebleeds and trouble swallowing.   Eyes: Negative for redness and visual disturbance.  Respiratory: Negative for cough, shortness of breath and wheezing.   Cardiovascular: Negative for chest pain, palpitations and leg swelling.  Gastrointestinal: Negative for abdominal pain, nausea and vomiting.       Negative for changes in bowel habits.  Endocrine: Positive for polydipsia. Negative for polyphagia and polyuria.  Genitourinary: Negative for decreased urine volume, dysuria and hematuria.  Skin: Negative for rash and wound.  Neurological: Negative for syncope, weakness, numbness and headaches.  Psychiatric/Behavioral: Negative for confusion. The patient is nervous/anxious.      No current outpatient medications on file prior to visit.   No current facility-administered medications on file prior to visit.      Past Medical History:  Diagnosis Date  . Anxiety   . Depression   . Migraine headache   . Nephrolithiasis    No Known Allergies  Social History   Socioeconomic History  . Marital status: Married    Spouse name: None  . Number of children: None  . Years of education: None  . Highest education level: None  Social Needs  .  Financial resource strain: None  . Food insecurity - worry: None  . Food insecurity - inability: None  . Transportation needs - medical: None  . Transportation needs - non-medical: None  Occupational History  . None  Tobacco Use  . Smoking status: Never Smoker  . Smokeless tobacco: Never Used  Substance and Sexual Activity  . Alcohol use: No  . Drug use: No  . Sexual activity: Yes    Birth control/protection: None  Other Topics Concern  . None  Social History Narrative  . None    Vitals:   03/20/17 1639  BP: 122/76  Pulse: 76  Resp: 12  Temp: 98.1 F (36.7 C)  SpO2: 98%   Body mass index is 37.86 kg/m.    Wt Readings from Last 3 Encounters:  03/20/17 249 lb (112.9 kg)  09/14/16 242 lb (109.8 kg)  02/22/16 250 lb 2 oz (113.5 kg)     Physical Exam  Nursing note and vitals reviewed. Constitutional: She is oriented to person, place, and time. She appears well-developed. No distress.  HENT:  Head: Normocephalic and atraumatic.  Mouth/Throat: Oropharynx is clear and moist and mucous membranes are normal.  Eyes: Conjunctivae are normal. Pupils are equal, round, and reactive to light.  Cardiovascular: Normal rate and regular rhythm.  No murmur heard. Pulses:      Dorsalis pedis pulses are 2+ on the right side, and 2+ on the left side.  Respiratory: Effort normal and breath sounds normal. No respiratory distress.  GI: Soft. She exhibits no mass. There is no hepatomegaly. There  is no tenderness.  Musculoskeletal: She exhibits no edema or tenderness.  Lymphadenopathy:    She has no cervical adenopathy.  Neurological: She is alert and oriented to person, place, and time. She has normal strength. Coordination normal.  Skin: Skin is warm. No erythema.  Psychiatric: She has a normal mood and affect.  Well groomed, good eye contact.      ASSESSMENT AND PLAN:   Angela Powers was seen today for 6 months follow-up.  Orders Placed This Encounter    Procedures  . Amb Referral to Nutrition and Diabetic E  . POC HgB A1c   Lab Results  Component Value Date   HGBA1C 7.0 03/20/2017    Diabetes mellitus type II, uncontrolled (HCC) New Dx. Metformin 500 mg twice daily recommended.  We discussed some side effects. Nutrition referral was placed for diabetes diet education. Follow-up in 3-4 months.  Anxiety disorder Well-controlled. No changes in current management. Instructed about warning signs. Since he has been well controlled for about a year I think it is appropriate to continue annual follow-ups.    BMI 38.0-38.9,adult We discussed benefits of wt loss as well as adverse effects of obesity. Consistency with healthy diet and physical activity recommended.      -Angela Powers was advised to return sooner than planned today if new concerns arise.       Angela G. [place], MD  [facility]. [place] office.

## 2017-03-20 ENCOUNTER — Ambulatory Visit: Payer: Managed Care, Other (non HMO) | Admitting: Family Medicine

## 2017-03-20 ENCOUNTER — Encounter: Payer: Self-pay | Admitting: Family Medicine

## 2017-03-20 VITALS — BP 122/76 | HR 76 | Temp 98.1°F | Resp 12 | Ht 68.0 in | Wt 249.0 lb

## 2017-03-20 DIAGNOSIS — F411 Generalized anxiety disorder: Secondary | ICD-10-CM

## 2017-03-20 DIAGNOSIS — E1165 Type 2 diabetes mellitus with hyperglycemia: Secondary | ICD-10-CM | POA: Diagnosis not present

## 2017-03-20 DIAGNOSIS — R739 Hyperglycemia, unspecified: Secondary | ICD-10-CM | POA: Diagnosis not present

## 2017-03-20 DIAGNOSIS — Z6837 Body mass index (BMI) 37.0-37.9, adult: Secondary | ICD-10-CM

## 2017-03-20 DIAGNOSIS — IMO0002 Reserved for concepts with insufficient information to code with codable children: Secondary | ICD-10-CM | POA: Insufficient documentation

## 2017-03-20 LAB — POCT GLYCOSYLATED HEMOGLOBIN (HGB A1C): Hemoglobin A1C: 7

## 2017-03-20 MED ORDER — METFORMIN HCL 500 MG PO TABS: 500.0000 mg | ORAL_TABLET | Freq: Two times a day (BID) | ORAL | 1 refills | Status: DC

## 2017-03-20 MED ORDER — ESCITALOPRAM OXALATE 10 MG PO TABS: 10.0000 mg | ORAL_TABLET | Freq: Every day | ORAL | 3 refills | Status: DC

## 2017-03-20 NOTE — Assessment & Plan Note (Signed)
Well-controlled. No changes in current management. Instructed about warning signs. Since he has been well controlled for about a year I think it is appropriate to continue annual follow-ups.

## 2017-03-20 NOTE — Assessment & Plan Note (Signed)
New Dx. Metformin 500 mg twice daily recommended.  We discussed some side effects. Nutrition referral was placed for diabetes diet education. Follow-up in 3-4 months.

## 2017-03-20 NOTE — Patient Instructions (Signed)
A few things to remember from today's visit:   Generalized anxiety disorder - Plan: escitalopram (LEXAPRO) 10 MG tablet  BMI 38.0-38.9,adult - Plan: POC HgB A1c  Hyperglycemia  Uncontrolled type 2 diabetes mellitus with hyperglycemia (HCC) - Plan: metFORMIN (GLUCOPHAGE) 500 MG tablet, Amb Referral to Nutrition and Diabetic E   Diabetes Mellitus and Nutrition When you have diabetes (diabetes mellitus), it is very important to have healthy eating habits because your blood sugar (glucose) levels are greatly affected by what you eat and drink. Eating healthy foods in the appropriate amounts, at about the same times every day, can help you:  Control your blood glucose.  Lower your risk of heart disease.  Improve your blood pressure.  Reach or maintain a healthy weight.  Every person with diabetes is different, and each person has different needs for a meal plan. Your health care provider may recommend that you work with a diet and nutrition specialist (dietitian) to make a meal plan that is best for you. Your meal plan may vary depending on factors such as:  The calories you need.  The medicines you take.  Your weight.  Your blood glucose, blood pressure, and cholesterol levels.  Your activity level.  Other health conditions you have, such as heart or kidney disease.  How do carbohydrates affect me? Carbohydrates affect your blood glucose level more than any other type of food. Eating carbohydrates naturally increases the amount of glucose in your blood. Carbohydrate counting is a method for keeping track of how many carbohydrates you eat. Counting carbohydrates is important to keep your blood glucose at a healthy level, especially if you use insulin or take certain oral diabetes medicines. It is important to know how many carbohydrates you can safely have in each meal. This is different for every person. Your dietitian can help you calculate how many carbohydrates you should have  at each meal and for snack. Foods that contain carbohydrates include:  Bread, cereal, rice, pasta, and crackers.  Potatoes and corn.  Peas, beans, and lentils.  Milk and yogurt.  Fruit and juice.  Desserts, such as cakes, cookies, ice cream, and candy.  How does alcohol affect me? Alcohol can cause a sudden decrease in blood glucose (hypoglycemia), especially if you use insulin or take certain oral diabetes medicines. Hypoglycemia can be a life-threatening condition. Symptoms of hypoglycemia (sleepiness, dizziness, and confusion) are similar to symptoms of having too much alcohol. If your health care provider says that alcohol is safe for you, follow these guidelines:  Limit alcohol intake to no more than 1 drink per day for nonpregnant women and 2 drinks per day for men. One drink equals 12 oz of beer, 5 oz of wine, or 1 oz of hard liquor.  Do not drink on an empty stomach.  Keep yourself hydrated with water, diet soda, or unsweetened iced tea.  Keep in mind that regular soda, juice, and other mixers may contain a lot of sugar and must be counted as carbohydrates.  What are tips for following this plan? Reading food labels  Start by checking the serving size on the label. The amount of calories, carbohydrates, fats, and other nutrients listed on the label are based on one serving of the food. Many foods contain more than one serving per package.  Check the total grams (g) of carbohydrates in one serving. You can calculate the number of servings of carbohydrates in one serving by dividing the total carbohydrates by 15. For example, if a food  has 30 g of total carbohydrates, it would be equal to 2 servings of carbohydrates.  Check the number of grams (g) of saturated and trans fats in one serving. Choose foods that have low or no amount of these fats.  Check the number of milligrams (mg) of sodium in one serving. Most people should limit total sodium intake to less than 2,300 mg  per day.  Always check the nutrition information of foods labeled as "low-fat" or "nonfat". These foods may be higher in added sugar or refined carbohydrates and should be avoided.  Talk to your dietitian to identify your daily goals for nutrients listed on the label. Shopping  Avoid buying canned, premade, or processed foods. These foods tend to be high in fat, sodium, and added sugar.  Shop around the outside edge of the grocery store. This includes fresh fruits and vegetables, bulk grains, fresh meats, and fresh dairy. Cooking  Use low-heat cooking methods, such as baking, instead of high-heat cooking methods like deep frying.  Cook using healthy oils, such as olive, canola, or sunflower oil.  Avoid cooking with butter, cream, or high-fat meats. Meal planning  Eat meals and snacks regularly, preferably at the same times every day. Avoid going long periods of time without eating.  Eat foods high in fiber, such as fresh fruits, vegetables, beans, and whole grains. Talk to your dietitian about how many servings of carbohydrates you can eat at each meal.  Eat 4-6 ounces of lean protein each day, such as lean meat, chicken, fish, eggs, or tofu. 1 ounce is equal to 1 ounce of meat, chicken, or fish, 1 egg, or 1/4 cup of tofu.  Eat some foods each day that contain healthy fats, such as avocado, nuts, seeds, and fish. Lifestyle   Check your blood glucose regularly.  Exercise at least 30 minutes 5 or more days each week, or as told by your health care provider.  Take medicines as told by your health care provider.  Do not use any products that contain nicotine or tobacco, such as cigarettes and e-cigarettes. If you need help quitting, ask your health care provider.  Work with a Veterinary surgeon or diabetes educator to identify strategies to manage stress and any emotional and social challenges. What are some questions to ask my health care provider?  Do I need to meet with a diabetes  educator?  Do I need to meet with a dietitian?  What number can I call if I have questions?  When are the best times to check my blood glucose? Where to find more information:  American Diabetes Association: diabetes.org/food-and-fitness/food  Academy of Nutrition and Dietetics: https://www.vargas.com/  [employer] of Diabetes and Digestive and Kidney Diseases (NIH): FindJewelers.cz Summary  A healthy meal plan will help you control your blood glucose and maintain a healthy lifestyle.  Working with a diet and nutrition specialist (dietitian) can help you make a meal plan that is best for you.  Keep in mind that carbohydrates and alcohol have immediate effects on your blood glucose levels. It is important to count carbohydrates and to use alcohol carefully. This information is not intended to replace advice given to you by your health care provider. Make sure you discuss any questions you have with your health care provider. Document Released: 09/23/2004 Document Revised: 02/01/2016 Document Reviewed: 02/01/2016 Elsevier Interactive Patient Education  [employer].  Please be sure medication list is accurate. If a new problem present, please set up appointment sooner than planned today.

## 2017-03-20 NOTE — Assessment & Plan Note (Signed)
We discussed benefits of wt loss as well as adverse effects of obesity. Consistency with healthy diet and physical activity recommended.  

## 2017-08-09 ENCOUNTER — Encounter: Payer: Self-pay | Admitting: Family Medicine

## 2017-08-09 ENCOUNTER — Ambulatory Visit: Payer: Managed Care, Other (non HMO) | Admitting: Family Medicine

## 2017-08-09 DIAGNOSIS — O4690 Antepartum hemorrhage, unspecified, unspecified trimester: Secondary | ICD-10-CM | POA: Diagnosis not present

## 2017-08-09 DIAGNOSIS — F411 Generalized anxiety disorder: Secondary | ICD-10-CM

## 2017-08-09 DIAGNOSIS — Z3201 Encounter for pregnancy test, result positive: Secondary | ICD-10-CM | POA: Diagnosis not present

## 2017-08-09 DIAGNOSIS — E1165 Type 2 diabetes mellitus with hyperglycemia: Secondary | ICD-10-CM | POA: Diagnosis not present

## 2017-08-09 LAB — HCG, QUANTITATIVE, PREGNANCY: Quantitative HCG: 2491 m[IU]/mL

## 2017-08-09 LAB — BASIC METABOLIC PANEL
BUN: 7 mg/dL (ref 6–23)
CO2: 29 meq/L (ref 19–32)
CREATININE: 0.47 mg/dL (ref 0.40–1.20)
Calcium: 9 mg/dL (ref 8.4–10.5)
Chloride: 103 mEq/L (ref 96–112)
GFR: 169.24 mL/min (ref >60.00)
GLUCOSE: 139 mg/dL — AB (ref 70–99)
Potassium: 4.3 mEq/L (ref 3.5–5.1)
Sodium: 137 mEq/L (ref 135–145)

## 2017-08-09 LAB — MICROALBUMIN / CREATININE URINE RATIO
CREATININE, U: 117 mg/dL
MICROALB/CREAT RATIO: 0.7 mg/g (ref 0.0–30.0)
Microalb, Ur: 0.9 mg/dL (ref 0.0–1.9)

## 2017-08-09 LAB — TSH: TSH: 1.02 u[IU]/mL (ref 0.35–4.50)

## 2017-08-09 LAB — HEMOGLOBIN A1C: HEMOGLOBIN A1C: 6.9 % — AB (ref 4.6–6.5)

## 2017-08-09 MED ORDER — ESCITALOPRAM OXALATE 10 MG PO TABS: 5.0000 mg | ORAL_TABLET | Freq: Every day | ORAL | 3 refills | Status: DC

## 2017-08-09 NOTE — Progress Notes (Signed)
ACUTE VISIT   HPI:  Chief Complaint  Patient presents with  . Follow-up    AngelaAngela Powers is a 27 y.o. female, who is here today reporting a positive pregnancy test. Regular periods and not on birth control. LMP 06/26/17.  She is G1P0  She has some mild lower abdominal cramping like pain, similar to those she had when she was going to get her menstrual period. She has not noted dysuria, gross hematuria, urgency, or frequency. A couple days ago she has some vaginal bleeding. She already started prenatal vitamins.  She is having mild morning nausea.  DM 2: Dx in 03/2017.  Currently she is on metformin 500 mg daily. She is tolerating medication well, is not reporting side effects.  BS's FG: 90's- and post praedial: 130's.  Last eye exam 2 years ago, she has an appt 10/2017.  Lab Results  Component Value Date   HGBA1C 7.0 03/20/2017   Denies abdominal pain, nausea,vomiting, polydipsia,polyuria, or polyphagia.  She is trying to eat healthier and she is exercising regularly.  Anxiety: Currently she is on Lexapro 10 mg daily. In the past she tried Prozac but did not help. In general she feels like symptoms are well controlled, which she also attributes to getting a new job,less stress.  Denies depressed mood or suicidal thoughts. She is tolerating medication well, no side effects reported.   Review of Systems  Constitutional: Negative for activity change, appetite change, fatigue and fever.  HENT: Negative for mouth sores, nosebleeds and trouble swallowing.   Eyes: Negative for redness and visual disturbance.  Respiratory: Negative for cough, shortness of breath and wheezing.   Cardiovascular: Negative for chest pain, palpitations and leg swelling.  Gastrointestinal: Positive for nausea. Negative for abdominal pain and vomiting.       Negative for changes in bowel habits.  Endocrine: Negative for cold intolerance, heat intolerance, polydipsia,  polyphagia and polyuria.  Genitourinary: Positive for vaginal bleeding. Negative for decreased urine volume, dysuria, hematuria and vaginal discharge.  Skin: Negative for pallor and rash.  Neurological: Negative for syncope, weakness and headaches.  Psychiatric/Behavioral: Negative for confusion and suicidal ideas. The patient is not nervous/anxious.       Current Outpatient Medications on File Prior to Visit  Medication Sig Dispense Refill  . metFORMIN (GLUCOPHAGE) 500 MG tablet Take 1 tablet (500 mg total) by mouth 2 (two) times daily with a meal. 180 tablet 1  . Prenatal Vit-Fe Fumarate-FA (PRENATAL VITAMIN PO) Take by mouth daily.     No current facility-administered medications on file prior to visit.      Past Medical History:  Diagnosis Date  . Anxiety   . Depression   . Migraine headache   . Nephrolithiasis    No Known Allergies  Social History   Socioeconomic History  . Marital status: Married    Spouse name: Not on file  . Number of children: Not on file  . Years of education: Not on file  . Highest education level: Not on file  Occupational History  . Not on file  Social Needs  . Financial resource strain: Not on file  . Food insecurity:    Worry: Not on file    Inability: Not on file  . Transportation needs:    Medical: Not on file    Non-medical: Not on file  Tobacco Use  . Smoking status: Never Smoker  . Smokeless tobacco: Never Used  Substance and Sexual Activity  . Alcohol  use: No  . Drug use: No  . Sexual activity: Yes    Birth control/protection: None  Lifestyle  . Physical activity:    Days per week: Not on file    Minutes per session: Not on file  . Stress: Not on file  Relationships  . Social connections:    Talks on phone: Not on file    Gets together: Not on file    Attends religious service: Not on file    Active member of club or organization: Not on file    Attends meetings of clubs or organizations: Not on file     Relationship status: Not on file  Other Topics Concern  . Not on file  Social History Narrative  . Not on file    Vitals:   08/09/17 0840  BP: 124/80  Pulse: 83  Resp: 12  Temp: 98.5 F (36.9 C)  SpO2: 98%   Body mass index is 38.07 kg/m.      Physical Exam  Nursing note and vitals reviewed. Constitutional: She is oriented to person, place, and time. She appears well-developed. No distress.  HENT:  Head: Normocephalic and atraumatic.  Mouth/Throat: Oropharynx is clear and moist and mucous membranes are normal.  Eyes: Pupils are equal, round, and reactive to light. Conjunctivae are normal.  Cardiovascular: Normal rate and regular rhythm.  No murmur heard. Pulses:      Dorsalis pedis pulses are 2+ on the right side, and 2+ on the left side.  Respiratory: Effort normal and breath sounds normal. No respiratory distress.  GI: Soft. She exhibits no mass. There is no hepatomegaly. There is no tenderness.  Musculoskeletal: She exhibits no edema.  Lymphadenopathy:    She has no cervical adenopathy.  Neurological: She is alert and oriented to person, place, and time. She has normal strength. Gait normal.  Skin: Skin is warm. No erythema.  Psychiatric: She has a normal mood and affect.  Well groomed, good eye contact.   Diabetic Foot Exam - Simple   Simple Foot Form Diabetic Foot exam was performed with the following findings:  Yes 08/09/2017  1:38 PM  Visual Inspection No deformities, no ulcerations, no other skin breakdown bilaterally:  Yes Sensation Testing Intact to touch and monofilament testing bilaterally:  Yes Pulse Check Posterior Tibialis and Dorsalis pulse intact bilaterally:  Yes Comments      ASSESSMENT AND PLAN:   [name] was seen today for follow-up.  Diagnoses and all orders for this visit:  Uncontrolled type 2 diabetes mellitus with hyperglycemia (HCC)  HgA1C is pending. No changes in current management, will adjust medication according to  HgA1C results. Regular exercise and healthy diet with avoidance of added sugar food intake is an important part of treatment and recommended. Annual eye exam, periodic dental and foot care recommended.  -     Basic metabolic panel -     Microalbumin / creatinine urine ratio -     Hemoglobin A1c -     Amb Referral to Nutrition and Diabetic E  Generalized anxiety disorder  Symptoms are well controlled. She feels like she could decrease medication, so she will decrease Lexapro from 10 mg to 5 mg. Instructed about warning signs. Follow-up in 5 to 6 weeks, before if needed.  -     escitalopram (LEXAPRO) 10 MG tablet; Take 0.5 tablets (5 mg total) by mouth daily.  Positive pregnancy test  OB referral was placed. Continue prenatal vitamins.  -     hCG, quantitative,  pregnancy -     US PELVIC COMPLETE WITH TRANSVAGINAL; Future -     Ambulatory referral to Obstetrics / Gynecology -     TSH  Vaginal bleeding during pregnancy  ?  Implantation. She was clearly instructed about warning signs. hCG and transvaginal US ordered. We will repeat hCG in 48 hours.  -     hCG, quantitative, pregnancy -     US PELVIC COMPLETE WITH TRANSVAGINAL; Future -     Ambulatory referral to Obstetrics / Gynecology -     TSH     Return in about 5 weeks (around 09/13/2017) for anxiety.     Betty G. [place], MD  [facility]. [place] office.

## 2017-08-09 NOTE — Patient Instructions (Signed)
A few things to remember from today's visit:   Uncontrolled type 2 diabetes mellitus with hyperglycemia (HCC) - Plan: Basic metabolic panel, Microalbumin / creatinine urine ratio, Hemoglobin A1c  Generalized anxiety disorder - Plan: escitalopram (LEXAPRO) 10 MG tablet  Positive pregnancy test - Plan: hCG, quantitative, pregnancy, US PELVIC COMPLETE WITH TRANSVAGINAL, Ambulatory referral to Obstetrics / Gynecology, TSH  Vaginal bleeding during pregnancy - Plan: hCG, quantitative, pregnancy, US PELVIC COMPLETE WITH TRANSVAGINAL, Ambulatory referral to Obstetrics / Gynecology, TSH  Decrease Lexapro to 5 mg.   Please be sure medication list is accurate. If a new problem present, please set up appointment sooner than planned today.

## 2017-08-10 ENCOUNTER — Encounter: Payer: Self-pay | Admitting: Family Medicine

## 2017-08-11 ENCOUNTER — Other Ambulatory Visit: Payer: Self-pay | Admitting: Family Medicine

## 2017-08-11 ENCOUNTER — Other Ambulatory Visit (INDEPENDENT_AMBULATORY_CARE_PROVIDER_SITE_OTHER): Payer: Managed Care, Other (non HMO)

## 2017-08-11 ENCOUNTER — Ambulatory Visit (HOSPITAL_COMMUNITY)
Admission: RE | Admit: 2017-08-11 | Discharge: 2017-08-11 | Disposition: A | Discharge status: Home / Self Care | Payer: Managed Care, Other (non HMO) | Source: Ambulatory Visit | Attending: Family Medicine | Admitting: Family Medicine

## 2017-08-11 DIAGNOSIS — O4690 Antepartum hemorrhage, unspecified, unspecified trimester: Secondary | ICD-10-CM

## 2017-08-11 DIAGNOSIS — Z3201 Encounter for pregnancy test, result positive: Secondary | ICD-10-CM | POA: Insufficient documentation

## 2017-08-11 DIAGNOSIS — O30041 Twin pregnancy, dichorionic/diamniotic, first trimester: Secondary | ICD-10-CM | POA: Insufficient documentation

## 2017-08-11 DIAGNOSIS — Z3A01 Less than 8 weeks gestation of pregnancy: Secondary | ICD-10-CM | POA: Diagnosis not present

## 2017-08-11 LAB — HCG, QUANTITATIVE, PREGNANCY: QUANTITATIVE HCG: 5151 m[IU]/mL

## 2017-08-14 ENCOUNTER — Encounter: Payer: Self-pay | Admitting: Family Medicine

## 2017-08-23 ENCOUNTER — Other Ambulatory Visit (HOSPITAL_COMMUNITY): Payer: Self-pay | Admitting: Obstetrics and Gynecology

## 2017-08-23 DIAGNOSIS — Z3490 Encounter for supervision of normal pregnancy, unspecified, unspecified trimester: Secondary | ICD-10-CM

## 2017-08-28 ENCOUNTER — Ambulatory Visit (HOSPITAL_COMMUNITY)
Admission: RE | Admit: 2017-08-28 | Discharge: 2017-08-28 | Disposition: A | Discharge status: Home / Self Care | Payer: Managed Care, Other (non HMO) | Source: Ambulatory Visit | Attending: Obstetrics and Gynecology | Admitting: Obstetrics and Gynecology

## 2017-08-28 ENCOUNTER — Ambulatory Visit (HOSPITAL_COMMUNITY): Payer: Managed Care, Other (non HMO)

## 2017-08-28 DIAGNOSIS — Z3490 Encounter for supervision of normal pregnancy, unspecified, unspecified trimester: Secondary | ICD-10-CM | POA: Insufficient documentation

## 2017-08-28 DIAGNOSIS — Z3A00 Weeks of gestation of pregnancy not specified: Secondary | ICD-10-CM | POA: Insufficient documentation

## 2017-09-17 ENCOUNTER — Encounter (HOSPITAL_BASED_OUTPATIENT_CLINIC_OR_DEPARTMENT_OTHER): Payer: Self-pay | Admitting: *Deleted

## 2017-09-17 ENCOUNTER — Other Ambulatory Visit: Payer: Self-pay

## 2017-09-17 ENCOUNTER — Inpatient Hospital Stay (HOSPITAL_BASED_OUTPATIENT_CLINIC_OR_DEPARTMENT_OTHER)
Admission: AD | Admit: 2017-09-17 | Discharge: 2017-09-18 | Disposition: A | Discharge status: Other Acute Inpatient Hospital | Payer: Managed Care, Other (non HMO) | Attending: Emergency Medicine | Admitting: Emergency Medicine

## 2017-09-17 DIAGNOSIS — O9989 Other specified diseases and conditions complicating pregnancy, childbirth and the puerperium: Secondary | ICD-10-CM | POA: Insufficient documentation

## 2017-09-17 DIAGNOSIS — O2341 Unspecified infection of urinary tract in pregnancy, first trimester: Secondary | ICD-10-CM | POA: Diagnosis not present

## 2017-09-17 DIAGNOSIS — E119 Type 2 diabetes mellitus without complications: Secondary | ICD-10-CM | POA: Diagnosis not present

## 2017-09-17 DIAGNOSIS — N12 Tubulo-interstitial nephritis, not specified as acute or chronic: Secondary | ICD-10-CM | POA: Diagnosis not present

## 2017-09-17 DIAGNOSIS — R109 Unspecified abdominal pain: Secondary | ICD-10-CM | POA: Insufficient documentation

## 2017-09-17 DIAGNOSIS — Z7984 Long term (current) use of oral hypoglycemic drugs: Secondary | ICD-10-CM | POA: Diagnosis not present

## 2017-09-17 DIAGNOSIS — Z3A11 11 weeks gestation of pregnancy: Secondary | ICD-10-CM | POA: Insufficient documentation

## 2017-09-17 DIAGNOSIS — N200 Calculus of kidney: Secondary | ICD-10-CM

## 2017-09-17 HISTORY — DX: Type 2 diabetes mellitus without complications: E11.9

## 2017-09-17 LAB — CBC WITH DIFFERENTIAL/PLATELET
BASOS PCT: 0 %
Basophils Absolute: 0 10*3/uL (ref 0.0–0.1)
EOS ABS: 0.2 10*3/uL (ref 0.0–0.7)
EOS PCT: 2 %
HCT: 38.3 % (ref 36.0–46.0)
Hemoglobin: 12.7 g/dL (ref 12.0–15.0)
LYMPHS ABS: 2.8 10*3/uL (ref 0.7–4.0)
Lymphocytes Relative: 21 %
MCH: 28.1 pg (ref 26.0–34.0)
MCHC: 33.2 g/dL (ref 30.0–36.0)
MCV: 84.7 fL (ref 78.0–100.0)
Monocytes Absolute: 0.9 10*3/uL (ref 0.1–1.0)
Monocytes Relative: 6 %
Neutro Abs: 9.8 10*3/uL — ABNORMAL HIGH (ref 1.7–7.7)
Neutrophils Relative %: 71 %
PLATELETS: 384 10*3/uL (ref 150–400)
RBC: 4.52 MIL/uL (ref 3.87–5.11)
RDW: 13 % (ref 11.5–15.5)
WBC: 13.7 10*3/uL — AB (ref 4.0–10.5)

## 2017-09-17 LAB — COMPREHENSIVE METABOLIC PANEL
ALBUMIN: 3.5 g/dL (ref 3.5–5.0)
ALT: 12 U/L (ref 0–44)
AST: 14 U/L — AB (ref 15–41)
Alkaline Phosphatase: 62 U/L (ref 38–126)
Anion gap: 10 (ref 5–15)
BUN: 11 mg/dL (ref 6–20)
CHLORIDE: 102 mmol/L (ref 98–111)
CO2: 25 mmol/L (ref 22–32)
Calcium: 9.5 mg/dL (ref 8.9–10.3)
Creatinine, Ser: 0.79 mg/dL (ref 0.44–1.00)
GFR calc Af Amer: >60 mL/min (ref >60)
GFR calc non Af Amer: >60 mL/min (ref >60)
GLUCOSE: 113 mg/dL — AB (ref 70–99)
Potassium: 3.7 mmol/L (ref 3.5–5.1)
Sodium: 137 mmol/L (ref 135–145)
Total Bilirubin: 0.2 mg/dL — ABNORMAL LOW (ref 0.3–1.2)
Total Protein: 7.5 g/dL (ref 6.5–8.1)

## 2017-09-17 LAB — URINALYSIS, MICROSCOPIC (REFLEX)

## 2017-09-17 LAB — URINALYSIS, ROUTINE W REFLEX MICROSCOPIC
Bilirubin Urine: NEGATIVE
GLUCOSE, UA: 250 mg/dL — AB
KETONES UR: NEGATIVE mg/dL
Nitrite: NEGATIVE
PROTEIN: NEGATIVE mg/dL
Specific Gravity, Urine: 1.015 (ref 1.005–1.030)
pH: 7 (ref 5.0–8.0)

## 2017-09-17 MED ORDER — SODIUM CHLORIDE 0.9 % IV SOLN
1.0000 g | Freq: Once | INTRAVENOUS | Status: AC
  Administered 2017-09-17: 1 g via INTRAVENOUS
  Filled 2017-09-17: qty 10

## 2017-09-17 MED ORDER — FENTANYL CITRATE (PF) 100 MCG/2ML IJ SOLN
50.0000 ug | Freq: Once | INTRAMUSCULAR | Status: AC
  Administered 2017-09-17: 50 ug via INTRAVENOUS
  Filled 2017-09-17: qty 2

## 2017-09-17 MED ORDER — SODIUM CHLORIDE 0.9 % IV BOLUS
1000.0000 mL | Freq: Once | INTRAVENOUS | Status: AC
  Administered 2017-09-17: 1000 mL via INTRAVENOUS

## 2017-09-17 NOTE — ED Triage Notes (Addendum)
C/o left flank pain since Tuesday. Seen at [facility] and told she might have kidney stone. She is [redacted] weeks pregnant. Pt alert in triage. NAD. States she has been taking hydrocodone with some relief

## 2017-09-17 NOTE — ED Provider Notes (Signed)
[facility] EMERGENCY DEPARTMENT Provider Note   CSN: [id number] Arrival date & time: 09/17/17  1928     History   Chief Complaint Chief Complaint  Patient presents with  . Flank Pain    HPI Angela Powers is a 27 y.o. female history of diabetes, depression, but it was [redacted] weeks pregnant here presenting with left flank pain.  Patient was diagnosed with kidney stone about 5 days ago at [facility].  She also was diagnosed with pyelonephritis and was sent home with a course of Keflex as well as hydrocodone.  She states that she is still taking the Keflex and hydrocodone but has persistent left flank pain.  Feeling nauseated but denies any fevers or vomiting.  Patient denies any vaginal bleeding or discharge.  Patient states that [facility] with Dr[facility] Velvet Bathe.   Records reviewed from [facility]. Patient had US renal that showed mild L hydro, UA + UTI but urine culture was negative. She had unremarkable OB US that showed normal IUP several days ago.   The history is provided by the patient.    Past Medical History:  Diagnosis Date  . Anxiety   . Depression   . Diabetes mellitus without complication (HCC)   . Migraine headache   . Nephrolithiasis     Patient Active Problem List   Diagnosis Date Noted  . Diabetes mellitus type II, uncontrolled (HCC) 03/20/2017  . Anxiety disorder 10/20/2015  . 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 BMI 38.0-38.9,adult 10/20/2015    History reviewed. No pertinent surgical history.   OB History    Gravida  1   Para      Term      Preterm      AB      Living        SAB      TAB      Ectopic      Multiple      Live Births               Home Medications    Prior to Admission medications   Medication Sig Start Date End Date Taking? Authorizing Provider  escitalopram (LEXAPRO) 10 MG tablet Take 0.5 tablets (5 mg total) by mouth daily. 08/09/17  Yes [place], Betty  G, MD  metFORMIN (GLUCOPHAGE) 500 MG tablet Take 1 tablet (500 mg total) by mouth 2 (two) times daily with a meal. 03/20/17  Yes [place], Betty G, MD  Prenatal Vit-Fe Fumarate-FA (PRENATAL VITAMIN PO) Take by mouth daily.   Yes [provider]    Family History Family History  Problem Relation Age of Onset  . Mental illness Mother     Social History Social History   Tobacco Use  . Smoking status: Never Smoker  . Smokeless tobacco: Never Used  Substance Use Topics  . Alcohol use: No  . Drug use: No     Allergies   Patient has no known allergies.   Review of Systems Review of Systems  Genitourinary: Positive for flank pain.  All other systems reviewed and are negative.    Physical Exam Updated Vital Signs BP 136/69 (BP Location: Right Arm)   Pulse 64   Temp 99.1 F (37.3 C) (Oral)   Resp 16   Ht 5\' 8"  (1.727 m)   Wt 106.6 kg   LMP 06/26/2017   SpO2 99%   BMI 35.73 kg/m   Physical Exam  Constitutional: She is oriented  to person, place, and time. She appears well-developed.  Slightly uncomfortable   HENT:  Head: Normocephalic.  Mouth/Throat: Oropharynx is clear and moist.  Eyes: Pupils are equal, round, and reactive to light. Conjunctivae and EOM are normal.  Neck: Normal range of motion. Neck supple.  Cardiovascular: Normal rate, regular rhythm and normal heart sounds.  Pulmonary/Chest: Effort normal and breath sounds normal. No stridor. No respiratory distress.  Abdominal: Soft. Bowel sounds are normal.  + L CVAT. Obese, gravid uterus, nontender   Musculoskeletal: Normal range of motion.  Neurological: She is alert and oriented to person, place, and time.  Skin: Skin is warm. Capillary refill takes less than 2 seconds.  Psychiatric: She has a normal mood and affect.  Nursing note and vitals reviewed.    ED Treatments / Results  Labs (all labs ordered are listed, but only abnormal results are displayed) Labs Reviewed  URINALYSIS, ROUTINE W  REFLEX MICROSCOPIC - Abnormal; Notable for the following components:      Result Value   Glucose, UA 250 (*)    Hgb urine dipstick TRACE (*)    Leukocytes, UA TRACE (*)    All other components within normal limits  URINALYSIS, MICROSCOPIC (REFLEX) - Abnormal; Notable for the following components:   Bacteria, UA MANY (*)    All other components within normal limits  CBC WITH DIFFERENTIAL/PLATELET - Abnormal; Notable for the following components:   WBC 13.7 (*)    Neutro Abs 9.8 (*)    All other components within normal limits  URINE CULTURE  COMPREHENSIVE METABOLIC PANEL    EKG None  Radiology No results found.  Procedures Procedures (including critical care time)  Medications Ordered in ED Medications  sodium chloride 0.9 % bolus 1,000 mL (1,000 mLs Intravenous New Bag/Given 09/17/17 2216)  cefTRIAXone (ROCEPHIN) 1 g in sodium chloride 0.9 % 100 mL IVPB (1 g Intravenous New Bag/Given 09/17/17 2217)  fentaNYL (SUBLIMAZE) injection 50 mcg (50 mcg Intravenous Given 09/17/17 2230)     Initial Impression / Assessment and Plan / ED Course  I have reviewed the triage vital signs and the nursing notes.  Pertinent labs & imaging results that were available during my care of the patient were reviewed by me and considered in my medical decision making (see chart for details).     Angela Powers is a 27 y.o. female here with L flank pain, nausea. She is [redacted] weeks pregnant and had hydro on [place] several days ago and also had pyelo on keflex. Low grade temp in the ED. I am concerned for persistent pyelo vs infected stone. Will get labs, UA. Will give IVF.   11:16 PM WBC 14. UA + UTI. I am concerned for pyelo vs infected kidney stone. I called her OB doctor, Dr. Rana Snare, who accepted patient to [facility] hospital for admission.    Final Clinical Impressions(s) / ED Diagnoses   Final diagnoses:  None    ED Discharge Orders    None       Charlynne Pander, MD 09/17/17 2319

## 2017-09-18 ENCOUNTER — Encounter (HOSPITAL_COMMUNITY): Payer: Self-pay

## 2017-09-18 ENCOUNTER — Ambulatory Visit: Payer: Managed Care, Other (non HMO) | Admitting: Family Medicine

## 2017-09-18 ENCOUNTER — Encounter: Payer: Self-pay | Admitting: Family Medicine

## 2017-09-18 ENCOUNTER — Other Ambulatory Visit: Payer: Self-pay

## 2017-09-18 VITALS — BP 124/83 | HR 77 | Temp 98.4°F | Resp 16 | Ht 68.0 in | Wt 234.4 lb

## 2017-09-18 DIAGNOSIS — R109 Unspecified abdominal pain: Secondary | ICD-10-CM | POA: Diagnosis not present

## 2017-09-18 DIAGNOSIS — F411 Generalized anxiety disorder: Secondary | ICD-10-CM | POA: Diagnosis not present

## 2017-09-18 MED ORDER — HYDROMORPHONE HCL 1 MG/ML IJ SOLN
1.0000 mg | Freq: Once | INTRAMUSCULAR | Status: AC
  Administered 2017-09-18: 1 mg via INTRAVENOUS
  Filled 2017-09-18: qty 1

## 2017-09-18 MED ORDER — ONDANSETRON HCL 4 MG/2ML IJ SOLN
4.0000 mg | Freq: Once | INTRAMUSCULAR | Status: AC
  Administered 2017-09-18: 4 mg via INTRAVENOUS
  Filled 2017-09-18: qty 2

## 2017-09-18 MED ORDER — PROMETHAZINE HCL 25 MG/ML IJ SOLN
12.5000 mg | Freq: Once | INTRAMUSCULAR | Status: AC
  Administered 2017-09-18: 12.5 mg via INTRAVENOUS
  Filled 2017-09-18: qty 1

## 2017-09-18 MED ORDER — ESCITALOPRAM OXALATE 5 MG PO TABS: ORAL_TABLET | ORAL | 0 refills | Status: DC

## 2017-09-18 NOTE — Patient Instructions (Addendum)
A few things to remember from today's visit:   Generalized anxiety disorder - Plan: escitalopram (LEXAPRO) 5 MG tablet  Left flank pain  Continue weaning off Lexapro. If symptoms get worse please arrange follow-up appointment or follow-up with your OB.  In regard to flank pain completed antibiotic treatment, adequate hydration, and strain urine   Please be sure medication list is accurate. If a new problem present, please set up appointment sooner than planned today.

## 2017-09-18 NOTE — Assessment & Plan Note (Signed)
She is reporting no symptoms. She agrees we will continue weaning of Lexapro, so she will decrease dose from 5 mg daily to 2.5 mg daily for 2 weeks, then every other day for a week, then discontinue. Instructed about warning signs. She will continue following with her OB.

## 2017-09-18 NOTE — Progress Notes (Signed)
HPI:   Angela Powers is a 27 y.o. female, who is here today with her husband to follow on recent OV.    Anxiety, she has been on Lexapro for a few months. Anxiety was exacerbated by stress at work and improved after she changed jobs. Last visit we decreased Lexapro from 10 mg to 5 mg. She is tolerating medication changes well, denies depressed mood or suicidal thoughts.  Since her last visit she has been in the ER twice due to left flank pain. Pain is starts on left sided low back and radiates to abdomen LLQ. No history of nephrolithiasis.  Last ER visit was yesterday, she was started on Keflex for possible pyelonephritis. She denies dysuria, hematuria, decreased urine output, increased frequency, or incontinence.  She has mild nausea when pain is severe. She has not identified exacerbating or alleviating factors. In general the pain seems to be getting better.  She is currently on hydrocodone-acetaminophen 5-325 mg 4 times daily as needed. She has not noted chills, fever, chest pain, dyspnea, or changes in bowel habits. + Decreased appetite and therefore wt loss.   No rash on affected area, although she is reporting some pruritic rash on her abdomen and upper extremities.  She attributes rash to her sister's new [place] pig.   Review of Systems  Constitutional: Positive for appetite change. Negative for activity change and fatigue.  Respiratory: Negative for shortness of breath and wheezing.   Cardiovascular: Negative for chest pain, palpitations and leg swelling.  Gastrointestinal: Positive for abdominal pain and nausea. Negative for diarrhea and vomiting.  Genitourinary: Positive for flank pain. Negative for decreased urine volume, dysuria, hematuria, pelvic pain and urgency.  Skin: Positive for rash.  Neurological: Negative for weakness and headaches.  Psychiatric/Behavioral: Positive for sleep disturbance (due to pain). The patient is not  nervous/anxious.       Current Outpatient Medications on File Prior to Visit  Medication Sig Dispense Refill  . cephALEXin (KEFLEX) 250 MG capsule Take by mouth 4 (four) times daily.    . metFORMIN (GLUCOPHAGE) 500 MG tablet Take 1 tablet (500 mg total) by mouth 2 (two) times daily with a meal. 180 tablet 1  . Prenatal Vit-Fe Fumarate-FA (PRENATAL VITAMIN PO) Take by mouth daily.    Marland Kitchen HYDROcodone-acetaminophen (NORCO) 10-325 MG tablet Take 1 tablet by mouth every 6 (six) hours as needed.     No current facility-administered medications on file prior to visit.      Past Medical History:  Diagnosis Date  . Anxiety   . Depression   . Diabetes mellitus without complication (HCC)   . Migraine headache   . Nephrolithiasis    No Known Allergies  Social History   Socioeconomic History  . Marital status: Married    Spouse name: Not on file  . Number of children: Not on file  . Years of education: Not on file  . Highest education level: Not on file  Occupational History  . Not on file  Social Needs  . Financial resource strain: Not on file  . Food insecurity:    Worry: Not on file    Inability: Not on file  . Transportation needs:    Medical: Not on file    Non-medical: Not on file  Tobacco Use  . Smoking status: Never Smoker  . Smokeless tobacco: Never Used  Substance and Sexual Activity  . Alcohol use: No  . Drug use: No  . Sexual activity: Yes  Birth control/protection: None  Lifestyle  . Physical activity:    Days per week: Not on file    Minutes per session: Not on file  . Stress: Not on file  Relationships  . Social connections:    Talks on phone: Not on file    Gets together: Not on file    Attends religious service: Not on file    Active member of club or organization: Not on file    Attends meetings of clubs or organizations: Not on file    Relationship status: Not on file  Other Topics Concern  . Not on file  Social History Narrative  . Not on  file    Vitals:   09/18/17 1624  BP: 124/83  Pulse: 77  Resp: 16  Temp: 98.4 F (36.9 C)  SpO2: 98%   Body mass index is 35.64 kg/m.   Wt Readings from Last 3 Encounters:  09/18/17 234 lb 6 oz (106.3 kg)  09/17/17 235 lb (106.6 kg)  08/09/17 250 lb 6 oz (113.6 kg)      Physical Exam  Nursing note and vitals reviewed. Constitutional: She is oriented to person, place, and time. She appears well-developed. No distress.  HENT:  Head: Normocephalic and atraumatic.  Mouth/Throat: Oropharynx is clear and moist and mucous membranes are normal.  Eyes: Pupils are equal, round, and reactive to light. Conjunctivae and EOM are normal.  Cardiovascular: Normal rate and regular rhythm.  No murmur heard. Respiratory: Effort normal and breath sounds normal. No respiratory distress.  GI: Soft. There is no hepatomegaly. There is tenderness in the right lower quadrant and left lower quadrant. There is no rigidity, no rebound, no guarding and no CVA tenderness.    Musculoskeletal: She exhibits no edema.       Lumbar back: She exhibits tenderness. She exhibits no bony tenderness.       Back:  Neurological: She is alert and oriented to person, place, and time. She has normal strength. Gait normal.  Skin: Skin is warm. No erythema.  Psychiatric: She has a normal mood and affect. Cognition and memory are normal. She expresses no suicidal ideation. She expresses no suicidal plans.  Well-groomed, most of the time looking at her husband when given Hx.    ASSESSMENT AND PLAN:  Angela Powers was seen today for follow-up.  Diagnoses and all orders for this visit:  Left flank pain Possible etiology discussed. ?  Musculoskeletal, nephrolithiasis (does not have hematuria). It seems to be improving. Urine strainer given. She will complete antibiotic treatment, final Ucx report pending. Clearly instructed about warning signs. Follow-up with OB later this week.   Anxiety disorder She is  reporting no symptoms. She agrees we will continue weaning of Lexapro, so she will decrease dose from 5 mg daily to 2.5 mg daily for 2 weeks, then every other day for a week, then discontinue. Instructed about warning signs. She will continue following with her OB.      Betty G. [place], MD  [facility]. [place] office.

## 2017-09-18 NOTE — MAU Note (Signed)
Pt. Transferred via [facility] from [facility]. Reports pain that started in left flank on Tuesday. Went to [facility] and was given antibiotics. Reports she completed the treatment and pain did not improve. Now rates pain 5/10 after receiving pain medication from [facility].

## 2017-09-18 NOTE — MAU Provider Note (Signed)
History     CSN: [id number]  Arrival date and time: 09/17/17 0349   First Provider Initiated Contact with Patient 09/18/17 0240      Chief Complaint  Patient presents with  . Flank Pain   HPI  [name] is a 27 y.o. year old G1P0 female at [redacted]w[redacted]d weeks twin gestation who presents to MAU via [facility] from [facility] where she was worked up for pyelonephritis or kidney stone, given Rocephin IM, pelvic U/S, 0.9%NS of IVFs. She reports LT flank pain since Tuesday 09/12/17. After receiving Zofran and Fentanyl at [facility], she rates her pain at a variable rate; 4/10 to 6/10.  Past Medical History:  Diagnosis Date  . Anxiety   . Depression   . Diabetes mellitus without complication (HCC)   . Migraine headache   . Nephrolithiasis     History reviewed. No pertinent surgical history.  Family History  Problem Relation Age of Onset  . Mental illness Mother     Social History   Tobacco Use  . Smoking status: Never Smoker  . Smokeless tobacco: Never Used  Substance Use Topics  . Alcohol use: No  . Drug use: No    Allergies: No Known Allergies  Medications Prior to Admission  Medication Sig Dispense Refill Last Dose  . cephALEXin (KEFLEX) 250 MG capsule Take by mouth 4 (four) times daily.     Marland Kitchen escitalopram (LEXAPRO) 10 MG tablet Take 0.5 tablets (5 mg total) by mouth daily. 90 tablet 3   . HYDROcodone-acetaminophen (NORCO) 10-325 MG tablet Take 1 tablet by mouth every 6 (six) hours as needed.     . metFORMIN (GLUCOPHAGE) 500 MG tablet Take 1 tablet (500 mg total) by mouth 2 (two) times daily with a meal. 180 tablet 1 Taking  . Prenatal Vit-Fe Fumarate-FA (PRENATAL VITAMIN PO) Take by mouth daily.   Taking    Review of Systems  Constitutional: Negative.   HENT: Negative.   Eyes: Negative.   Respiratory: Negative.   Cardiovascular: Negative.   Gastrointestinal: Positive for nausea.  Endocrine: Negative.   Genitourinary: Positive for flank  pain.  Musculoskeletal: Positive for back pain ("getting better than when I was at [facility]").  Skin: Negative.   Allergic/Immunologic: Negative.   Neurological: Negative.   Hematological: Negative.   Psychiatric/Behavioral: Negative.    Physical Exam   Blood pressure 120/65, pulse 62, temperature 99.3 F (37.4 C), temperature source Oral, resp. rate 18, height 5\' 8"  (1.727 m), weight 106.6 kg, last menstrual period 06/26/2017, SpO2 100 %.  Physical Exam  Nursing note and vitals reviewed. Constitutional: She is oriented to person, place, and time. She appears well-developed and well-nourished.  HENT:  Head: Normocephalic and atraumatic.  Eyes: Pupils are equal, round, and reactive to light.  Neck: Normal range of motion.  Cardiovascular: Normal rate.  Respiratory: Effort normal.  GI: Soft. Bowel sounds are normal. There is tenderness in the left upper quadrant and left lower quadrant. There is CVA tenderness (bilaterally).  Genitourinary:  Genitourinary Comments: Pelvic U/S done at [facility]  Neurological: She is alert and oriented to person, place, and time.  Skin: Skin is warm and dry.  Psychiatric: She has a normal mood and affect. Her behavior is normal. Judgment and thought content normal.    MAU Course  Procedures  MDM Dilaudid 1 mg IV Phenergan 12.5 mg IV  *Consult with Dr. Rana Snare @ [phone] - notified of patient's complaints, assessments - recommended tx plan d/c  home, have pt call office for F/U appt this week & have her get referral to urologist from office as well   Just prior to d/c home - patient requests medication nausea and pain  Assessment and Plan  Pyelonephritis  Flank pain  Kidney stone - Information provided on pyelonephritis, pregnancy & UTI  - F/U with [facility] this week for F/U & referral to Urologist - Patient verbalized an understanding of the plan of care and agrees.   Raelyn Mora, MSN, CNM 09/18/2017, 2:41 AM

## 2017-09-19 LAB — URINE CULTURE: Culture: NO GROWTH

## 2017-09-26 ENCOUNTER — Other Ambulatory Visit: Payer: Self-pay | Admitting: Urology

## 2017-09-26 DIAGNOSIS — N130 Hydronephrosis with ureteropelvic junction obstruction: Secondary | ICD-10-CM

## 2017-09-26 DIAGNOSIS — Z3A11 11 weeks gestation of pregnancy: Secondary | ICD-10-CM

## 2017-10-03 ENCOUNTER — Other Ambulatory Visit: Payer: Self-pay | Admitting: Family Medicine

## 2017-10-03 ENCOUNTER — Ambulatory Visit (HOSPITAL_COMMUNITY)
Admission: RE | Admit: 2017-10-03 | Discharge: 2017-10-03 | Disposition: A | Discharge status: Home / Self Care | Payer: Managed Care, Other (non HMO) | Source: Ambulatory Visit | Attending: Urology | Admitting: Urology

## 2017-10-03 DIAGNOSIS — O26891 Other specified pregnancy related conditions, first trimester: Secondary | ICD-10-CM | POA: Insufficient documentation

## 2017-10-03 DIAGNOSIS — O9989 Other specified diseases and conditions complicating pregnancy, childbirth and the puerperium: Secondary | ICD-10-CM | POA: Diagnosis not present

## 2017-10-03 DIAGNOSIS — O26611 Liver and biliary tract disorders in pregnancy, first trimester: Secondary | ICD-10-CM | POA: Insufficient documentation

## 2017-10-03 DIAGNOSIS — N130 Hydronephrosis with ureteropelvic junction obstruction: Secondary | ICD-10-CM | POA: Diagnosis present

## 2017-10-03 DIAGNOSIS — E1165 Type 2 diabetes mellitus with hyperglycemia: Secondary | ICD-10-CM

## 2017-10-03 DIAGNOSIS — Z3A11 11 weeks gestation of pregnancy: Secondary | ICD-10-CM

## 2017-10-03 DIAGNOSIS — K8020 Calculus of gallbladder without cholecystitis without obstruction: Secondary | ICD-10-CM | POA: Insufficient documentation

## 2018-02-05 ENCOUNTER — Encounter: Payer: Self-pay | Admitting: *Deleted

## 2018-02-05 ENCOUNTER — Ambulatory Visit: Payer: Managed Care, Other (non HMO) | Admitting: Family Medicine

## 2018-02-05 ENCOUNTER — Encounter: Payer: Self-pay | Admitting: Family Medicine

## 2018-02-05 VITALS — BP 130/80 | HR 75 | Temp 98.4°F | Resp 16 | Ht 68.0 in | Wt 246.5 lb

## 2018-02-05 DIAGNOSIS — R05 Cough: Secondary | ICD-10-CM | POA: Diagnosis not present

## 2018-02-05 DIAGNOSIS — J309 Allergic rhinitis, unspecified: Secondary | ICD-10-CM

## 2018-02-05 DIAGNOSIS — R059 Cough, unspecified: Secondary | ICD-10-CM

## 2018-02-05 DIAGNOSIS — J0100 Acute maxillary sinusitis, unspecified: Secondary | ICD-10-CM | POA: Diagnosis not present

## 2018-02-05 MED ORDER — AMOXICILLIN 875 MG PO TABS: 875.0000 mg | ORAL_TABLET | Freq: Two times a day (BID) | ORAL | 0 refills | Status: AC

## 2018-02-05 MED ORDER — CROMOLYN SODIUM 5.2 MG/ACT NA AERS: 1.0000 | INHALATION_SPRAY | Freq: Three times a day (TID) | NASAL | 1 refills | Status: DC | PRN

## 2018-02-05 NOTE — Progress Notes (Addendum)
ACUTE VISIT  HPI:  Chief Complaint  Patient presents with  . Cough  . Nausea  . Nasal Congestion    [name] is a 28 y.o.female here today with her husband complaining of 3 weeks of nonproductive cough. Pregnant, she is in her third trimester.  For the past 2 days symptoms seem to be worse, dysphonia and facial pain started. She has not noted fever or chills. Nasal congestion, worse at night when lying down. She cannot breathe through her nose, rhinorrhea, postnasal drainage.  "Little" sore throat today. Left ear ache, no hearing loss or ear drainage.  Mild mid bilateral chest pain upon deep breathing and cough. She denies associated dyspnea, wheezing, or diaphoresis.   Cough  This is a new problem. The current episode started 1 to 4 weeks ago. The problem has been gradually worsening. Associated symptoms include chest pain, ear congestion, ear pain, headaches (frontal pressure), nasal congestion, postnasal drip, rhinorrhea, a sore throat and shortness of breath. Pertinent negatives include no chills, eye redness, fever, heartburn, hemoptysis, myalgias, rash or wheezing. The symptoms are aggravated by exercise and lying down. She has tried OTC cough suppressant for the symptoms. Her past medical history is significant for environmental allergies.  Right-sided chest soreness that started yesterday. Intermittent,no radiated. It is exacerbated by coughing and deep breathing. "Little" SOB.   Mild nausea last night , no more than usual.  No Hx of recent travel. No sick contact. No known insect bite.  Hx of allergies: Allergy rhinitis usually during spring.  OTC medications for this problem: Delsym, which has helped some.    Review of Systems  Constitutional: Positive for fatigue. Negative for activity change, appetite change, chills and fever.  HENT: Positive for congestion, ear pain, postnasal drip, rhinorrhea, sinus pain, sore throat and  voice change. Negative for facial swelling, mouth sores, sneezing and trouble swallowing.   Eyes: Negative for discharge and redness.  Respiratory: Positive for cough and shortness of breath. Negative for hemoptysis and wheezing.   Cardiovascular: Positive for chest pain. Negative for palpitations and leg swelling.  Gastrointestinal: Positive for nausea. Negative for abdominal pain, diarrhea, heartburn and vomiting.  Genitourinary: Negative for dysuria, vaginal bleeding and vaginal discharge.  Musculoskeletal: Negative for gait problem and myalgias.  Skin: Negative for rash.  Allergic/Immunologic: Positive for environmental allergies.  Neurological: Positive for headaches (frontal pressure). Negative for syncope and weakness.  Hematological: Negative for adenopathy.  Psychiatric/Behavioral: Negative for confusion. The patient is nervous/anxious.       Current Outpatient Medications on File Prior to Visit  Medication Sig Dispense Refill  . metFORMIN (GLUCOPHAGE) 500 MG tablet TAKE 1 TABLET BY MOUTH 2 TIMES DAILY WITH A MEAL. 180 tablet 1  . Prenatal Vit-Fe Fumarate-FA (PRENATAL VITAMIN PO) Take by mouth daily.     No current facility-administered medications on file prior to visit.      Past Medical History:  Diagnosis Date  . Allergy   . Anxiety   . Depression   . Diabetes mellitus without complication (HCC)   . Migraine headache   . Nephrolithiasis    No Known Allergies  Social History   Socioeconomic History  . Marital status: Married    Spouse name: Not on file  . Number of children: Not on file  . Years of education: Not on file  . Highest education level: Not on file  Occupational History  . Not on file  Social Needs  . Financial resource strain:  Not on file  . Food insecurity:    Worry: Not on file    Inability: Not on file  . Transportation needs:    Medical: Not on file    Non-medical: Not on file  Tobacco Use  . Smoking status: Never Smoker  .  Smokeless tobacco: Never Used  Substance and Sexual Activity  . Alcohol use: No  . Drug use: No  . Sexual activity: Yes    Birth control/protection: None  Lifestyle  . Physical activity:    Days per week: Not on file    Minutes per session: Not on file  . Stress: Not on file  Relationships  . Social connections:    Talks on phone: Not on file    Gets together: Not on file    Attends religious service: Not on file    Active member of club or organization: Not on file    Attends meetings of clubs or organizations: Not on file    Relationship status: Not on file  Other Topics Concern  . Not on file  Social History Narrative  . Not on file    Vitals:   02/05/18 1104  BP: 130/80  Pulse: 75  Resp: 16  Temp: 98.4 F (36.9 C)  SpO2: 97%   Body mass index is 37.48 kg/m.   Physical Exam  Nursing note and vitals reviewed. Constitutional: She is oriented to person, place, and time. She appears well-developed. She does not appear ill. No distress.  HENT:  Head: Normocephalic and atraumatic.  Right Ear: Tympanic membrane, external ear and ear canal normal.  Left Ear: Tympanic membrane, external ear and ear canal normal.  Nose: Rhinorrhea present.  Mouth/Throat: Oropharynx is clear and moist and mucous membranes are normal.  Nasal voice,mild dysphonia. Hypertrophic turbinates. Greenish, copious postnasal drainage. Mild tenderness upon pressing maxillary and frontal sinuses.  Eyes: Conjunctivae are normal.  Cardiovascular: Normal rate and regular rhythm.  No murmur heard. Respiratory: Effort normal and breath sounds normal. No stridor. No respiratory distress. She exhibits tenderness (costochondral joints bilateral).  Lymphadenopathy:       Head (right side): No submandibular adenopathy present.       Head (left side): No submandibular adenopathy present.    She has no cervical adenopathy.  Neurological: She is alert and oriented to person, place, and time. She has normal  strength. Gait normal.  Skin: Skin is warm. No rash noted. No erythema.  Psychiatric: She has a normal mood and affect.  Well groomed, good eye contact.    ASSESSMENT AND PLAN:  Ms. Harlequin was seen today for cough, nausea and nasal congestion.  Diagnoses and all orders for this visit:  Acute non-recurrent maxillary sinusitis Explained this could be allergic or viral related. Because persistent respiratory symptoms and getting worse,I recommend abx treatment.  -     amoxicillin (AMOXIL) 875 MG tablet; Take 1 tablet (875 mg total) by mouth 2 (two) times daily for 7 days.  Allergic rhinitis, unspecified seasonality, unspecified trigger Nasal irrigation with saline several times per day. Cromolyn nasal spray may also help. Intranasal steroid is not recommended at this time.  -     cromolyn (NASALCROM) 5.2 MG/ACT nasal spray; Place 1 spray into both nostrils 3 (three) times daily as needed for allergies or rhinitis.  Cough Lung auscultation negative. We discussed possible etiologies, I feel like it is most likely allergy related. SOB could be related to nasal congestion. Because symptoms have been persistent for about 3 weeks, recommend  antibiotic treatment.  We discussed some side effects and possible risk of medications during pregnancy. Honey may help as well as increasing fluid intake.  Reassured about chest pain, musculoskeletal most likely. Recommend avoiding shallow breathing. Deep breathing a few times during the day.  Instructed to monitor for fever.    -[name] was advised to seek attention immediately if symptoms worsen. She voices understanding.     Ajayla Iglesias G. [place], MD  [facility]. [place] office.

## 2018-02-05 NOTE — Patient Instructions (Addendum)
A few things to remember from today's visit:   Acute non-recurrent maxillary sinusitis - Plan: amoxicillin (AMOXIL) 875 MG tablet  Allergic rhinitis, unspecified seasonality, unspecified trigger - Plan: cromolyn (NASALCROM) 5.2 MG/ACT nasal spray  Nasal irrigations with saline several times per day may help with symptoms. Monitor for fever.  Over-the-counter steroid nasal spray is not indicated in pregnancy. Nasal cromolyn can be used if needed for nasal congestion.

## 2018-02-07 ENCOUNTER — Encounter: Payer: Self-pay | Admitting: *Deleted

## 2018-02-07 ENCOUNTER — Telehealth: Payer: Self-pay | Admitting: Family Medicine

## 2018-02-07 ENCOUNTER — Ambulatory Visit (INDEPENDENT_AMBULATORY_CARE_PROVIDER_SITE_OTHER): Payer: Managed Care, Other (non HMO) | Admitting: *Deleted

## 2018-02-07 DIAGNOSIS — R509 Fever, unspecified: Secondary | ICD-10-CM

## 2018-02-07 LAB — POCT INFLUENZA A/B
Influenza A, POC: NEGATIVE
Influenza B, POC: NEGATIVE

## 2018-02-07 NOTE — Telephone Encounter (Signed)
Copied from CRM [id number]. Topic: Quick Communication - See Telephone Encounter >> Feb 07, 2018  8:52 AM Jolayne Haines L wrote: CRM for notification. See Telephone encounter for: 02/07/18.  Patient states she seen Dr Swaz[place]and on Monday 1/27 and she gave her amoxicillin and a nasal spray for cough and congestion. She was advised to call back if she developed a fever, last night and this morning it is around 100. She would like the nurse to call her.

## 2018-02-07 NOTE — Telephone Encounter (Signed)
Pt states that her fever set in last night about 730pm, highest 99.5-100.2. Being treated with tylenol.  Overall body aches. Pt states that the cough is unchanged. Increased amount of mucous but there is no color change - clear.   Please advise. Thanks.   Instructions  02/05/2018 Return if symptoms worsen or fail to improve.  A few things to remember from today's visit:   Acute non-recurrent maxillary sinusitis - Plan: amoxicillin (AMOXIL) 875 MG tablet  Allergic rhinitis, unspecified seasonality, unspecified trigger - Plan: cromolyn (NASALCROM) 5.2 MG/ACT nasal spray  Nasal irrigations with saline several times per day may help with symptoms. Monitor for fever.  Over-the-counter steroid nasal spray is not indicated in pregnancy. Nasal cromolyn can be used if needed for nasal congestion.

## 2018-02-07 NOTE — Telephone Encounter (Signed)
Spoke with patient and gave directions per Dr. Swaziland. Patient verbalized understanding and would be here around 12 pm to test for the flu.

## 2018-02-07 NOTE — Telephone Encounter (Signed)
Message sent to Dr. Jordan for review. 

## 2018-02-07 NOTE — Telephone Encounter (Signed)
Patient had nurse visit for flu test done. Nothing further needed at this time.

## 2018-02-07 NOTE — Progress Notes (Signed)
Patient advised by PCP to come into office for flu test due to fever last night. Patient was seen on 02/05/2018, currently on antibiotics. Marga Melnick, CMA

## 2018-02-07 NOTE — Telephone Encounter (Signed)
Please bring patient for lab appointment, to check for flu. Thanks, BJ

## 2018-02-08 DIAGNOSIS — E119 Type 2 diabetes mellitus without complications: Secondary | ICD-10-CM | POA: Insufficient documentation

## 2018-02-12 ENCOUNTER — Encounter: Payer: Self-pay | Admitting: *Deleted

## 2018-02-12 ENCOUNTER — Ambulatory Visit (INDEPENDENT_AMBULATORY_CARE_PROVIDER_SITE_OTHER): Payer: Managed Care, Other (non HMO) | Admitting: *Deleted

## 2018-02-12 VITALS — BP 133/70 | HR 67

## 2018-02-12 DIAGNOSIS — O30043 Twin pregnancy, dichorionic/diamniotic, third trimester: Secondary | ICD-10-CM

## 2018-02-12 DIAGNOSIS — O24113 Pre-existing diabetes mellitus, type 2, in pregnancy, third trimester: Secondary | ICD-10-CM | POA: Diagnosis not present

## 2018-02-12 DIAGNOSIS — O30049 Twin pregnancy, dichorionic/diamniotic, unspecified trimester: Secondary | ICD-10-CM

## 2018-02-12 NOTE — Progress Notes (Signed)
Copy of report and NST tracing sent to Dr. Leger w/pt today.  

## 2018-02-15 ENCOUNTER — Ambulatory Visit (INDEPENDENT_AMBULATORY_CARE_PROVIDER_SITE_OTHER): Payer: Managed Care, Other (non HMO) | Admitting: *Deleted

## 2018-02-15 DIAGNOSIS — O30049 Twin pregnancy, dichorionic/diamniotic, unspecified trimester: Secondary | ICD-10-CM | POA: Diagnosis not present

## 2018-02-15 DIAGNOSIS — O24113 Pre-existing diabetes mellitus, type 2, in pregnancy, third trimester: Secondary | ICD-10-CM

## 2018-02-15 NOTE — Progress Notes (Signed)
Copy of report and NST tracing sent to Dr. Lowe w/pt today.  

## 2018-02-19 ENCOUNTER — Ambulatory Visit (INDEPENDENT_AMBULATORY_CARE_PROVIDER_SITE_OTHER): Payer: Managed Care, Other (non HMO) | Admitting: General Practice

## 2018-02-19 VITALS — BP 149/84 | HR 72

## 2018-02-19 DIAGNOSIS — O30043 Twin pregnancy, dichorionic/diamniotic, third trimester: Secondary | ICD-10-CM | POA: Diagnosis not present

## 2018-02-19 DIAGNOSIS — O24113 Pre-existing diabetes mellitus, type 2, in pregnancy, third trimester: Secondary | ICD-10-CM | POA: Diagnosis not present

## 2018-02-19 NOTE — Progress Notes (Signed)
NST performed today. Patient has appt @ 2pm with Dr Vincente Poli. Copy of report & NST tracing given to patient to bring to appt. Patient has elevated BP in office today x2. Patient denies headaches, dizziness or blurry vision. Called [facility] and spoke with Alvino Chapel, Facilities manager who states patient has had some elevated pressures in their office before- patient can just keep appt this afternoon at 2pm with them.  Chase Caller RN BSN 02/19/18

## 2018-02-22 ENCOUNTER — Inpatient Hospital Stay (HOSPITAL_COMMUNITY)
Admission: AD | Admit: 2018-02-22 | Discharge: 2018-03-02 | DRG: 788 | Disposition: A | Discharge status: Home / Self Care | Payer: Managed Care, Other (non HMO) | Attending: Obstetrics & Gynecology | Admitting: Obstetrics & Gynecology

## 2018-02-22 ENCOUNTER — Other Ambulatory Visit: Payer: Self-pay

## 2018-02-22 ENCOUNTER — Encounter (HOSPITAL_COMMUNITY): Payer: Self-pay

## 2018-02-22 ENCOUNTER — Ambulatory Visit (INDEPENDENT_AMBULATORY_CARE_PROVIDER_SITE_OTHER): Payer: Managed Care, Other (non HMO) | Admitting: *Deleted

## 2018-02-22 VITALS — BP 156/78 | HR 72 | Wt 253.1 lb

## 2018-02-22 DIAGNOSIS — O4593 Premature separation of placenta, unspecified, third trimester: Secondary | ICD-10-CM | POA: Diagnosis present

## 2018-02-22 DIAGNOSIS — O1493 Unspecified pre-eclampsia, third trimester: Secondary | ICD-10-CM | POA: Diagnosis not present

## 2018-02-22 DIAGNOSIS — O30043 Twin pregnancy, dichorionic/diamniotic, third trimester: Secondary | ICD-10-CM | POA: Diagnosis present

## 2018-02-22 DIAGNOSIS — E119 Type 2 diabetes mellitus without complications: Secondary | ICD-10-CM | POA: Diagnosis present

## 2018-02-22 DIAGNOSIS — R030 Elevated blood-pressure reading, without diagnosis of hypertension: Secondary | ICD-10-CM | POA: Diagnosis present

## 2018-02-22 DIAGNOSIS — D649 Anemia, unspecified: Secondary | ICD-10-CM | POA: Diagnosis present

## 2018-02-22 DIAGNOSIS — O2412 Pre-existing diabetes mellitus, type 2, in childbirth: Secondary | ICD-10-CM | POA: Diagnosis present

## 2018-02-22 DIAGNOSIS — Z3A33 33 weeks gestation of pregnancy: Secondary | ICD-10-CM

## 2018-02-22 DIAGNOSIS — O24113 Pre-existing diabetes mellitus, type 2, in pregnancy, third trimester: Secondary | ICD-10-CM | POA: Diagnosis not present

## 2018-02-22 DIAGNOSIS — IMO0002 Reserved for concepts with insufficient information to code with codable children: Secondary | ICD-10-CM

## 2018-02-22 DIAGNOSIS — O1414 Severe pre-eclampsia complicating childbirth: Principal | ICD-10-CM | POA: Diagnosis present

## 2018-02-22 DIAGNOSIS — O322XX2 Maternal care for transverse and oblique lie, fetus 2: Secondary | ICD-10-CM | POA: Diagnosis present

## 2018-02-22 DIAGNOSIS — O9902 Anemia complicating childbirth: Secondary | ICD-10-CM | POA: Diagnosis present

## 2018-02-22 DIAGNOSIS — O1413 Severe pre-eclampsia, third trimester: Secondary | ICD-10-CM

## 2018-02-22 DIAGNOSIS — O321XX1 Maternal care for breech presentation, fetus 1: Secondary | ICD-10-CM | POA: Diagnosis present

## 2018-02-22 DIAGNOSIS — Z7984 Long term (current) use of oral hypoglycemic drugs: Secondary | ICD-10-CM | POA: Diagnosis not present

## 2018-02-22 DIAGNOSIS — O1490 Unspecified pre-eclampsia, unspecified trimester: Secondary | ICD-10-CM | POA: Diagnosis present

## 2018-02-22 DIAGNOSIS — Z98891 History of uterine scar from previous surgery: Secondary | ICD-10-CM

## 2018-02-22 LAB — CBC WITH DIFFERENTIAL/PLATELET
Basophils Absolute: 0 10*3/uL (ref 0.0–0.1)
Basophils Relative: 0 %
EOS ABS: 0.1 10*3/uL (ref 0.0–0.5)
EOS PCT: 1 %
HCT: 32.9 % — ABNORMAL LOW (ref 36.0–46.0)
Hemoglobin: 10.8 g/dL — ABNORMAL LOW (ref 12.0–15.0)
LYMPHS ABS: 2.2 10*3/uL (ref 0.7–4.0)
Lymphocytes Relative: 26 %
MCH: 27.7 pg (ref 26.0–34.0)
MCHC: 32.8 g/dL (ref 30.0–36.0)
MCV: 84.4 fL (ref 80.0–100.0)
MONO ABS: 0.5 10*3/uL (ref 0.1–1.0)
MONOS PCT: 6 %
Neutro Abs: 5.7 10*3/uL (ref 1.7–7.7)
Neutrophils Relative %: 67 %
PLATELETS: 374 10*3/uL (ref 150–400)
RBC: 3.9 MIL/uL (ref 3.87–5.11)
RDW: 16.3 % — ABNORMAL HIGH (ref 11.5–15.5)
WBC: 8.5 10*3/uL (ref 4.0–10.5)
nRBC: 0 % (ref 0.0–0.2)

## 2018-02-22 LAB — COMPREHENSIVE METABOLIC PANEL
ALT: 10 U/L (ref 0–44)
AST: 21 U/L (ref 15–41)
Albumin: 2.4 g/dL — ABNORMAL LOW (ref 3.5–5.0)
Alkaline Phosphatase: 101 U/L (ref 38–126)
Anion gap: 8 (ref 5–15)
BILIRUBIN TOTAL: 0.8 mg/dL (ref 0.3–1.2)
BUN: 6 mg/dL (ref 6–20)
CALCIUM: 8.2 mg/dL — AB (ref 8.9–10.3)
CO2: 22 mmol/L (ref 22–32)
CREATININE: 0.48 mg/dL (ref 0.44–1.00)
Chloride: 105 mmol/L (ref 98–111)
Glucose, Bld: 152 mg/dL — ABNORMAL HIGH (ref 70–99)
Potassium: 3.2 mmol/L — ABNORMAL LOW (ref 3.5–5.1)
Sodium: 135 mmol/L (ref 135–145)
TOTAL PROTEIN: 6.2 g/dL — AB (ref 6.5–8.1)

## 2018-02-22 LAB — TYPE AND SCREEN
ABO/RH(D): O POS
Antibody Screen: NEGATIVE

## 2018-02-22 LAB — URINALYSIS, ROUTINE W REFLEX MICROSCOPIC
Glucose, UA: NEGATIVE mg/dL
KETONES UR: 15 mg/dL — AB
NITRITE: NEGATIVE
PROTEIN: 100 mg/dL — AB
SPECIFIC GRAVITY, URINE: 1.02 (ref 1.005–1.030)
pH: 7 (ref 5.0–8.0)

## 2018-02-22 LAB — URINALYSIS, MICROSCOPIC (REFLEX)

## 2018-02-22 LAB — PROTEIN / CREATININE RATIO, URINE
CREATININE, URINE: 178 mg/dL
Protein Creatinine Ratio: 0.66 mg/mg{Cre} — ABNORMAL HIGH (ref 0.00–0.15)
TOTAL PROTEIN, URINE: 117 mg/dL

## 2018-02-22 LAB — ABO/RH: ABO/RH(D): O POS

## 2018-02-22 MED ORDER — METFORMIN HCL 500 MG PO TABS
500.0000 mg | ORAL_TABLET | Freq: Two times a day (BID) | ORAL | Status: DC
  Administered 2018-02-23 – 2018-02-25 (×5): 500 mg via ORAL
  Filled 2018-02-22 (×10): qty 1

## 2018-02-22 MED ORDER — MAGNESIUM SULFATE BOLUS VIA INFUSION
6.0000 g | Freq: Once | INTRAVENOUS | Status: AC
  Administered 2018-02-22: 6 g via INTRAVENOUS
  Filled 2018-02-22: qty 500

## 2018-02-22 MED ORDER — HYDRALAZINE HCL 20 MG/ML IJ SOLN: 10.0000 mg | INTRAMUSCULAR | Status: DC | PRN

## 2018-02-22 MED ORDER — PRENATAL MULTIVITAMIN CH
1.0000 | ORAL_TABLET | Freq: Every day | ORAL | Status: DC
  Administered 2018-02-23 – 2018-02-25 (×3): 1 via ORAL
  Filled 2018-02-22 (×3): qty 1

## 2018-02-22 MED ORDER — LACTATED RINGERS IV SOLN
INTRAVENOUS | Status: DC
  Administered 2018-02-22 – 2018-02-23 (×3): via INTRAVENOUS

## 2018-02-22 MED ORDER — LABETALOL HCL 5 MG/ML IV SOLN: 80.0000 mg | INTRAVENOUS | Status: DC | PRN

## 2018-02-22 MED ORDER — LABETALOL HCL 5 MG/ML IV SOLN
80.0000 mg | INTRAVENOUS | Status: DC | PRN
  Administered 2018-02-25 – 2018-02-26 (×2): 80 mg via INTRAVENOUS
  Filled 2018-02-22 (×2): qty 16

## 2018-02-22 MED ORDER — ZOLPIDEM TARTRATE 5 MG PO TABS: 5.0000 mg | ORAL_TABLET | Freq: Every evening | ORAL | Status: DC | PRN

## 2018-02-22 MED ORDER — DOCUSATE SODIUM 100 MG PO CAPS
100.0000 mg | ORAL_CAPSULE | Freq: Every day | ORAL | Status: DC
  Administered 2018-02-24 – 2018-02-25 (×2): 100 mg via ORAL
  Filled 2018-02-22 (×3): qty 1

## 2018-02-22 MED ORDER — LABETALOL HCL 5 MG/ML IV SOLN
40.0000 mg | INTRAVENOUS | Status: DC | PRN
  Administered 2018-02-25 – 2018-02-26 (×2): 40 mg via INTRAVENOUS
  Filled 2018-02-22 (×3): qty 8

## 2018-02-22 MED ORDER — LABETALOL HCL 5 MG/ML IV SOLN: 20.0000 mg | INTRAVENOUS | Status: DC | PRN

## 2018-02-22 MED ORDER — ACETAMINOPHEN 325 MG PO TABS
650.0000 mg | ORAL_TABLET | ORAL | Status: DC | PRN
  Administered 2018-02-22 – 2018-02-24 (×2): 650 mg via ORAL
  Filled 2018-02-22 (×2): qty 2

## 2018-02-22 MED ORDER — MAGNESIUM SULFATE 40 G IN LACTATED RINGERS - SIMPLE
2.0000 g/h | INTRAVENOUS | Status: DC
  Administered 2018-02-22 – 2018-02-23 (×2): 2 g/h via INTRAVENOUS
  Filled 2018-02-22 (×2): qty 500

## 2018-02-22 MED ORDER — BETAMETHASONE SOD PHOS & ACET 6 (3-3) MG/ML IJ SUSP
12.0000 mg | INTRAMUSCULAR | Status: AC
  Administered 2018-02-22 – 2018-02-23 (×2): 12 mg via INTRAMUSCULAR
  Filled 2018-02-22 (×2): qty 2

## 2018-02-22 MED ORDER — LABETALOL HCL 5 MG/ML IV SOLN
40.0000 mg | INTRAVENOUS | Status: DC | PRN
  Administered 2018-02-22: 40 mg via INTRAVENOUS

## 2018-02-22 MED ORDER — LABETALOL HCL 5 MG/ML IV SOLN
20.0000 mg | INTRAVENOUS | Status: DC | PRN
  Administered 2018-02-22 – 2018-02-26 (×4): 20 mg via INTRAVENOUS
  Filled 2018-02-22 (×4): qty 4

## 2018-02-22 MED ORDER — LORATADINE 10 MG PO TABS
10.0000 mg | ORAL_TABLET | Freq: Every day | ORAL | Status: DC
  Administered 2018-02-22 – 2018-02-25 (×2): 10 mg via ORAL
  Filled 2018-02-22 (×6): qty 1

## 2018-02-22 MED ORDER — CALCIUM CARBONATE ANTACID 500 MG PO CHEW: 2.0000 | CHEWABLE_TABLET | ORAL | Status: DC | PRN

## 2018-02-22 NOTE — Progress Notes (Addendum)
Pt denies H/A or visual disturbances. Copy of report and NST tracing sent to Dr. Rana Snare w/pt today.

## 2018-02-22 NOTE — MAU Provider Note (Addendum)
History     CSN: [id number]  Arrival date and time: 02/22/18 1440   First Provider Initiated Contact with Patient 02/22/18 1530      Chief Complaint  Patient presents with  . Hypertension   HPI Angela Powers is a 28 y.o. G1P0 at [redacted]w[redacted]d with di-di twins who presents to MAU for evaluation of elevated blood pressure in MFM during her BPP this afternoon. She denies vaginal bleeding, leaking of fluid, decreased fetal movement, fever, falls, or recent illness.  She also denies headache, RUQ pain, visual disturbances, new onset swelling.   OB History    Gravida  1   Para      Term      Preterm      AB      Living        SAB      TAB      Ectopic      Multiple      Live Births              Past Medical History:  Diagnosis Date  . Allergy   . Anxiety   . Depression   . Diabetes mellitus without complication (HCC)   . Migraine headache   . Nephrolithiasis     History reviewed. No pertinent surgical history.  Family History  Problem Relation Age of Onset  . Mental illness Mother     Social History   Tobacco Use  . Smoking status: Never Smoker  . Smokeless tobacco: Never Used  Substance Use Topics  . Alcohol use: No  . Drug use: No    Allergies: No Known Allergies  Medications Prior to Admission  Medication Sig Dispense Refill Last Dose  . cromolyn (NASALCROM) 5.2 MG/ACT nasal spray Place 1 spray into both nostrils 3 (three) times daily as needed for allergies or rhinitis. 26 mL 1 02/21/2018 at Unknown time  . metFORMIN (GLUCOPHAGE) 500 MG tablet TAKE 1 TABLET BY MOUTH 2 TIMES DAILY WITH A MEAL. 180 tablet 1 02/21/2018 at Unknown time  . Prenatal Vit-Fe Fumarate-FA (PRENATAL VITAMIN PO) Take by mouth daily.   02/22/2018 at Unknown time    Review of Systems  Constitutional: Negative for chills, fatigue and fever.  Eyes: Negative for photophobia.  Respiratory: Negative for shortness of breath.   Cardiovascular: Negative for chest pain  and leg swelling.  Gastrointestinal: Negative for abdominal pain.  Genitourinary: Negative for difficulty urinating and vaginal bleeding.  Neurological: Negative for dizziness, seizures, syncope, weakness and headaches.  All other systems reviewed and are negative.  Physical Exam   Blood pressure (!) 157/91, pulse 78, temperature 98.1 F (36.7 C), temperature source Oral, resp. rate 18, height 5\' 8"  (1.727 m), weight 114.5 kg, last menstrual period 06/26/2017, SpO2 98 %.  Physical Exam  Nursing note and vitals reviewed. Constitutional: She appears well-developed and well-nourished.  Cardiovascular: Normal rate.  Respiratory: Effort normal.  GI: She exhibits no distension. There is no abdominal tenderness. There is no rebound and no guarding.  Gravid  Neurological: She is alert. She has normal strength and normal reflexes. No cranial nerve deficit or sensory deficit.  Skin: Skin is warm and dry.  Psychiatric: She has a normal mood and affect. Her behavior is normal. Judgment and thought content normal.    MAU Course  Procedures  --HPI, lab results and medication administration discussed with Dr. Macon Large and Dr. Elon Spanner.  --S/p Labetalol x 1 in MAU --Baby A: baseline 145, moderate variability, positive accels, no decels --  Baby B: baseline 140, moderate variability, positive accels, no decels --Toco: UI with occasional contractions, not felt by patient  Patient Vitals for the past 24 hrs:  BP Temp Temp src Pulse Resp SpO2 Height Weight  02/22/18 1615 (!) 149/81 - - 85 - - - -  02/22/18 1601 (!) 152/75 - - 77 - - - -  02/22/18 1535 (!) 161/93 - - 85 - - - -  02/22/18 1518 (!) 157/91 - - 78 - - - -  02/22/18 1451 (!) 171/86 98.1 F (36.7 C) Oral 78 18 98 % 5\' 8"  (1.727 m) 114.5 kg    Results for orders placed or performed during the hospital encounter of 02/22/18 (from the past 24 hour(s))  CBC with Differential/Platelet     Status: Abnormal   Collection Time: 02/22/18  3:24 PM   Result Value Ref Range   WBC 8.5 4.0 - 10.5 K/uL   RBC 3.90 3.87 - 5.11 MIL/uL   Hemoglobin 10.8 (L) 12.0 - 15.0 g/dL   HCT 81.132.9 (L) 91.436.0 - 78.246.0 %   MCV 84.4 80.0 - 100.0 fL   MCH 27.7 26.0 - 34.0 pg   MCHC 32.8 30.0 - 36.0 g/dL   RDW 95.616.3 (H) 21.311.5 - 08.615.5 %   Platelets 374 150 - 400 K/uL   nRBC 0.0 0.0 - 0.2 %   Neutrophils Relative % 67 %   Neutro Abs 5.7 1.7 - 7.7 K/uL   Lymphocytes Relative 26 %   Lymphs Abs 2.2 0.7 - 4.0 K/uL   Monocytes Relative 6 %   Monocytes Absolute 0.5 0.1 - 1.0 K/uL   Eosinophils Relative 1 %   Eosinophils Absolute 0.1 0.0 - 0.5 K/uL   Basophils Relative 0 %   Basophils Absolute 0.0 0.0 - 0.1 K/uL  Comprehensive metabolic panel     Status: Abnormal   Collection Time: 02/22/18  3:24 PM  Result Value Ref Range   Sodium 135 135 - 145 mmol/L   Potassium 3.2 (L) 3.5 - 5.1 mmol/L   Chloride 105 98 - 111 mmol/L   CO2 22 22 - 32 mmol/L   Glucose, Bld 152 (H) 70 - 99 mg/dL   BUN 6 6 - 20 mg/dL   Creatinine, Ser 5.780.48 0.44 - 1.00 mg/dL   Calcium 8.2 (L) 8.9 - 10.3 mg/dL   Total Protein 6.2 (L) 6.5 - 8.1 g/dL   Albumin 2.4 (L) 3.5 - 5.0 g/dL   AST 21 15 - 41 U/L   ALT 10 0 - 44 U/L   Alkaline Phosphatase 101 38 - 126 U/L   Total Bilirubin 0.8 0.3 - 1.2 mg/dL   GFR calc non Af Amer >60 >60 mL/min   GFR calc Af Amer >60 >60 mL/min   Anion gap 8 5 - 15  Urinalysis, Routine w reflex microscopic     Status: Abnormal   Collection Time: 02/22/18  3:30 PM  Result Value Ref Range   Color, Urine YELLOW YELLOW   APPearance HAZY (A) CLEAR   Specific Gravity, Urine 1.020 1.005 - 1.030   pH 7.0 5.0 - 8.0   Glucose, UA NEGATIVE NEGATIVE mg/dL   Hgb urine dipstick SMALL (A) NEGATIVE   Bilirubin Urine SMALL (A) NEGATIVE   Ketones, ur 15 (A) NEGATIVE mg/dL   Protein, ur 469100 (A) NEGATIVE mg/dL   Nitrite NEGATIVE NEGATIVE   Leukocytes,Ua MODERATE (A) NEGATIVE  Protein / creatinine ratio, urine     Status: Abnormal   Collection Time: 02/22/18  3:30 PM  Result  Value Ref Range   Creatinine, Urine 178.00 mg/dL   Total Protein, Urine 117 mg/dL   Protein Creatinine Ratio 0.66 (H) 0.00 - 0.15 mg/mg[Cre]  Urinalysis, Microscopic (reflex)     Status: Abnormal   Collection Time: 02/22/18  3:30 PM  Result Value Ref Range   RBC / HPF 0-5 0 - 5 RBC/hpf   WBC, UA 21-50 0 - 5 WBC/hpf   Bacteria, UA FEW (A) NONE SEEN   Squamous Epithelial / LPF 6-10 0 - 5   Mucus PRESENT     Assessment and Plan  --28 y.o. G1P0 at 110w2d  --Preeclampsia with severe features --Reactive tracing x 2 --Initiate Magnesium Sulfate and Betamethasone, orders placed, MAU RN notified --Dr. Elon Spanner at bedside to discuss plan of care --Admit to Antepartum  Calvert Cantor, CNM 02/22/2018, 4:30 PM

## 2018-02-22 NOTE — H&P (Signed)
Corrigan Schwenker is a 28 y.o. female presenting for elevated Bps. This pregnancy c/b di/di twins, T2DM, and depression. Her DM has been well controlled with metformin 500mg  BID, last A1c 6%. Her twins have had appropriate growth.  Today, she was being seen in MFM and had new onset elevated Bps requiring IV labetalol and UPC of 0.6. She currently denies HA, SOB, change in vision, RUQ/epigastric pain, or worsening swelling.    OB History    Gravida  1   Para      Term      Preterm      AB      Living        SAB      TAB      Ectopic      Multiple      Live Births             Past Medical History:  Diagnosis Date  . Allergy   . Anxiety   . Depression   . Diabetes mellitus without complication (HCC)   . Migraine headache   . Nephrolithiasis    History reviewed. No pertinent surgical history. Family History: family history includes Mental illness in her mother. Social History:  reports that she has never smoked. She has never used smokeless tobacco. She reports that she does not drink alcohol or use drugs.     Maternal Diabetes: Yes:  Diabetes Type:  Pre-pregnancy, Insulin/Medication controlled Genetic Screening: Normal Maternal Ultrasounds/Referrals: Normal Fetal Ultrasounds or other Referrals:  None Maternal Substance Abuse:  No Significant Maternal Medications:  None Significant Maternal Lab Results:  None Other Comments:  None  ROS History   Blood pressure (!) 149/81, pulse 85, temperature 98.1 F (36.7 C), temperature source Oral, resp. rate 18, height 5\' 8"  (1.727 m), weight 114.5 kg, last menstrual period 06/26/2017, SpO2 98 %. Exam Physical Exam  NAD, A&O Reg HR NWOB Abd soft, nondistended, gravid Neuro grossly intact CN 2-12 SVE deferred  Prenatal labs: ABO, Rh:   Antibody:   Rubella:   RPR:    HBsAg:    HIV:    GBS:     Assessment/Plan: 28 yo G1P0 @ 33.2 wga with di/di twins p/w PIH w/severe features by BP criteria and new  onset elevated UPC to 0.6. Dw pt and partner PIH and risks of expectant management until 57 wga. No current CI for immediate delivery. Admit to AP.    # Severe PIH - BP criteria + UPC 0.6. Remainder PIH labs WNL.  - IV magnesium - IV anti-hypertensives prn persistent severe range - PIH labs daily - delivery by 34 wga  # T2DM - C/w Metformin 500mg  BID. Last A1c 6% - FBS and 2 hr pp - cont metformin  # FWB- di/di twins - [facility] for [facility]   # ROD:  - desires primary CS   Ranae Pila 02/22/2018, 4:40 PM

## 2018-02-22 NOTE — MAU Note (Addendum)
Sent from office, BP elevated.  Was also elevated at last visit.  Denies HA, visual changes or epigastric pain, reports increase swelling in lower extr. No bleeding, leaking.  Int tightening, couple times a day, "moderate" last night..  Reactive NST earlier x2

## 2018-02-22 NOTE — Progress Notes (Signed)
Pt starting a contraction pattern. Dr. Elon SpannerLeger notified. Due to patient not feeling ctx will continue to just monitor. No need for a cervical exam at the time.

## 2018-02-23 ENCOUNTER — Inpatient Hospital Stay (HOSPITAL_BASED_OUTPATIENT_CLINIC_OR_DEPARTMENT_OTHER): Payer: Managed Care, Other (non HMO)

## 2018-02-23 ENCOUNTER — Encounter (HOSPITAL_COMMUNITY)
Admit: 2018-02-23 | Discharge: 2018-02-23 | Disposition: A | Discharge status: Home / Self Care | Payer: Managed Care, Other (non HMO) | Attending: Obstetrics and Gynecology | Admitting: Obstetrics and Gynecology

## 2018-02-23 DIAGNOSIS — Z3A33 33 weeks gestation of pregnancy: Secondary | ICD-10-CM

## 2018-02-23 DIAGNOSIS — O30043 Twin pregnancy, dichorionic/diamniotic, third trimester: Secondary | ICD-10-CM

## 2018-02-23 DIAGNOSIS — O24113 Pre-existing diabetes mellitus, type 2, in pregnancy, third trimester: Secondary | ICD-10-CM

## 2018-02-23 DIAGNOSIS — O1413 Severe pre-eclampsia, third trimester: Secondary | ICD-10-CM | POA: Diagnosis not present

## 2018-02-23 DIAGNOSIS — O1493 Unspecified pre-eclampsia, third trimester: Secondary | ICD-10-CM

## 2018-02-23 LAB — CBC
HCT: 36.6 % (ref 36.0–46.0)
HCT: 36.8 % (ref 36.0–46.0)
Hemoglobin: 11.6 g/dL — ABNORMAL LOW (ref 12.0–15.0)
Hemoglobin: 11.8 g/dL — ABNORMAL LOW (ref 12.0–15.0)
MCH: 27.4 pg (ref 26.0–34.0)
MCH: 27.8 pg (ref 26.0–34.0)
MCHC: 31.7 g/dL (ref 30.0–36.0)
MCHC: 32.1 g/dL (ref 30.0–36.0)
MCV: 86.5 fL (ref 80.0–100.0)
MCV: 86.8 fL (ref 80.0–100.0)
Platelets: 402 10*3/uL — ABNORMAL HIGH (ref 150–400)
Platelets: 425 10*3/uL — ABNORMAL HIGH (ref 150–400)
RBC: 4.23 MIL/uL (ref 3.87–5.11)
RBC: 4.24 MIL/uL (ref 3.87–5.11)
RDW: 16.7 % — AB (ref 11.5–15.5)
RDW: 16.9 % — AB (ref 11.5–15.5)
WBC: 10.3 10*3/uL (ref 4.0–10.5)
WBC: 9.2 10*3/uL (ref 4.0–10.5)
nRBC: 0 % (ref 0.0–0.2)
nRBC: 0 % (ref 0.0–0.2)

## 2018-02-23 LAB — COMPREHENSIVE METABOLIC PANEL
ALK PHOS: 112 U/L (ref 38–126)
ALT: 11 U/L (ref 0–44)
ALT: 10 U/L (ref 0–44)
AST: 26 U/L (ref 15–41)
AST: 18 U/L (ref 15–41)
Albumin: 2.7 g/dL — ABNORMAL LOW (ref 3.5–5.0)
Albumin: 2.6 g/dL — ABNORMAL LOW (ref 3.5–5.0)
Alkaline Phosphatase: 124 U/L (ref 38–126)
Anion gap: 13 (ref 5–15)
Anion gap: 11 (ref 5–15)
BUN: <5 mg/dL — ABNORMAL LOW (ref 6–20)
BUN: <5 mg/dL — ABNORMAL LOW (ref 6–20)
CALCIUM: 7.7 mg/dL — AB (ref 8.9–10.3)
CO2: 17 mmol/L — AB (ref 22–32)
CO2: 19 mmol/L — ABNORMAL LOW (ref 22–32)
Calcium: 7.5 mg/dL — ABNORMAL LOW (ref 8.9–10.3)
Chloride: 108 mmol/L (ref 98–111)
Chloride: 107 mmol/L (ref 98–111)
Creatinine, Ser: 0.54 mg/dL (ref 0.44–1.00)
Creatinine, Ser: 0.47 mg/dL (ref 0.44–1.00)
GFR calc Af Amer: >60 mL/min (ref >60)
GFR calc Af Amer: >60 mL/min (ref >60)
GFR calc non Af Amer: >60 mL/min (ref >60)
GFR calc non Af Amer: >60 mL/min (ref >60)
Glucose, Bld: 126 mg/dL — ABNORMAL HIGH (ref 70–99)
Glucose, Bld: 102 mg/dL — ABNORMAL HIGH (ref 70–99)
Potassium: 3.2 mmol/L — ABNORMAL LOW (ref 3.5–5.1)
Potassium: 3 mmol/L — ABNORMAL LOW (ref 3.5–5.1)
SODIUM: 138 mmol/L (ref 135–145)
Sodium: 137 mmol/L (ref 135–145)
Total Bilirubin: 0.8 mg/dL (ref 0.3–1.2)
Total Bilirubin: 0.3 mg/dL (ref 0.3–1.2)
Total Protein: 6.9 g/dL (ref 6.5–8.1)
Total Protein: 6.4 g/dL — ABNORMAL LOW (ref 6.5–8.1)

## 2018-02-23 LAB — GLUCOSE, CAPILLARY
Glucose-Capillary: 106 mg/dL — ABNORMAL HIGH (ref 70–99)
Glucose-Capillary: 133 mg/dL — ABNORMAL HIGH (ref 70–99)
Glucose-Capillary: 143 mg/dL — ABNORMAL HIGH (ref 70–99)

## 2018-02-23 LAB — CULTURE, OB URINE: CULTURE: NO GROWTH

## 2018-02-23 MED ORDER — LABETALOL HCL 200 MG PO TABS
200.0000 mg | ORAL_TABLET | Freq: Two times a day (BID) | ORAL | Status: DC
  Administered 2018-02-23 – 2018-02-24 (×4): 200 mg via ORAL
  Filled 2018-02-23 (×4): qty 1

## 2018-02-23 MED ORDER — INSULIN ASPART 100 UNIT/ML ~~LOC~~ SOLN
0.0000 [IU] | Freq: Three times a day (TID) | SUBCUTANEOUS | Status: DC
  Administered 2018-02-23 – 2018-02-26 (×3): 1 [IU] via SUBCUTANEOUS

## 2018-02-23 NOTE — Progress Notes (Signed)
33 3/7 Mild HA last night, none now. No blurry vision or epigastric pain  Today's Vitals   02/23/18 0201 02/23/18 0608 02/23/18 0811 02/23/18 0833  BP: (!) 146/81 (!) 148/81  (!) 149/82  Pulse: 87 85  83  Resp: 18 18  16   Temp:  98.1 F (36.7 C)  97.9 F (36.6 C)  TempSrc:  Oral  Oral  SpO2:  100%  100%  Weight:      Height:      PainSc:   0-No pain    Body mass index is 38.39 kg/m.  Patient smiling in NAD Lungs CTA Cor RRR Abdomen no epigastric tenderness DTR 3+  FHT cat one x 2 UC irritability  Magnesium sulfate infusing  A/P:  1) Severe Preeclampsia by BP criteria      Reflexes brisk>Continue magnesium sulfate for now      Labs today      MFM consult      34 weeks delivery      Clear liquids  2)T2DM     FBS 106 today after betamethasone     Metformin  3)Twins     [place] today     Neonatology consult     C/S for delivery

## 2018-02-23 NOTE — Consult Note (Addendum)
Maternal-Fetal Medicine  Name: Angela Powers MRN: [id number] Requesting Provider: Harold Hedge, MD  Angela Powers, G1 P0 at 33w 3d gestation with dichorionic-diamniotic twin pregnancy, was admitted yesterday with increased blood pressures.  At her prenatal visit, the systolic blood pressure was reportedly higher than 160 mm Hg. MAU blood pressures were 161/93 and 171/86 mm Hg that required IV antihypertensive management.  Patient does not have severe headache or visual disturbances or right upper quadrant pain or vaginal bleeding. She reports good fetal movements. She had mild headache yesterday that resolved. She did not have hypertension earlier in this pregnancy.  Patient is also receiving IV magnesium sulfate and she received the first dose of betamethasone.  Past medical history is significant for type 2 diabetes that is well-controlled on metformin.  She does not have thyroid disorder or any other chronic medical conditions.  Medications; Low-dose aspirin, prenatal vitamins, metformin, IV magnesium, labetalol 200 mg bid, betamethasone. Allergies: NKDA. Social: Denies tobacco or drug or alcohol use. She has been married 4 years and her husband is in good health. Family: No history of venous thromboembolism. Gyn history: No history of abnormal Pap smears or cervical surgeries. No history of breast disease. Prenatal course: Patient reports she had low risk for fetal aneuploidies on screening. Fetal growth assessment performed about 3 weeks ago at your office showed normal concordant fetal growths.  P/E: Patient is comfortably lying in bed; not in distress. HEENT: Normal. Abd: Soft gravid uterus; no tenderness. Minimal pedal edema is present. NST performed in the Unit has been reactive.  Labs: Hb 11.8, Hct 36.8, PLT 425, WBC 9.2, ALT 10, AST 18, creatinine 0.47, electrolytes normal. Protein/creatinine ratio 0.6. O positive. Fasting blood glucose 106  mg/dL.  Ultrasound: Dichorionic-diamniotic twin pregnancy. Twin A: Lower fetus, breech presentation, anterior placenta, female fetus. Amniotic fluid is normal and good fetal activity is seen. Fetal breathing movements did not meet the criteria. BPP 6/8 or 8/10 (including NST).  Twin B: Upper fetus, transverse lie, anterior placenta, female fetus. Amniotic fluid is normal and good fetal activity is seen. Antenatal testing is reassuring. BPP 8/8 or 10/10 (including NST).  I counseled the patient on the following: Preeclampsia with severe features: The diagnosis is more likely to be consistent with preeclampsia with severe features considering very high blood pressures at your office and at MAU. Patient also required IV antihypertensive management for control.  I explained the diagnosis and possible complications including eclampsia, stroke, pulmonary edema, end-organ damage and placental abruption.   Role of antihypertensives is to reduce the likelihood of maternal complications and they do not confer any fetal benefit. Magnesium sulfate is for eclampsia prophylaxis.  Timing of delivery: Even in the absence of severe features, delivery in twin pregnancies complicated by preeclampsia is recommended from late preterm (individualize). I counseled the patient that given that she required IV antihypertensive treatment and her initial blood pressures were high, it is reasonable to deliver at 34 weeks.  Earlier delivery may be necessary if complications (see below) develop. Patient is aware that cesarean section would be performed (malpresentation in twin A).  Diabetes:  Patient is aware of the possibility of temporary hyperglycemia from steroids. Sliding scale insulin is advised. Prematurity and diabetes increase the likelihood of NICU stay.  Recommendations: -Delivery at 34 weeks.  -Twice-daily NST. -[facility]PP on Monday at [facility]. -Continue labetalol. -Continue  metformin. -Redraw labs on Monday. Delivery is indicated (not limited to) for following reasons:  -Persistent and uncontrolled hypertension not  responding to antihypertensives  -Abnormal labs including increasing liver enzymes or thrombocytopenia (<100,000).  -Non-reassuring fetal heart trace.  -Oligohydramnios or severe fetal growth restriction or abnormal antenatal testings.  -Oliguria (hourly output 20 mL to 30 mL).  -Increasing creatinine (?1.1) Severe symptoms of headache or visual spots or persistent right upper quadrant pain.  -Placental abruption. -Insulin (Humalog) sliding scale (following may be considered) for postprandial values: Blood glucose <130 mg/dL: none 977-414 mg/dL: 2 units subcut. 181-230 mg/dL: 4 units 239-532 mg/dL: 6 units 023-343 mg/dL: 8 units and inform MD  -Higher fasting levels will require NPH insulin at bedtime. -Magnesium sulfate infusion for 24 hours postpartum. -Anticipate postpartum hemorrhage (blood should be available at short notice).  Thank you for your consult. Please do not hesitate to contact me if you have any questions or concerns.  Consultation including face-to-face counseling: 40 min.

## 2018-02-24 ENCOUNTER — Encounter (HOSPITAL_COMMUNITY): Payer: Self-pay | Admitting: *Deleted

## 2018-02-24 LAB — COMPREHENSIVE METABOLIC PANEL
ALT: 10 U/L (ref 0–44)
ANION GAP: 10 (ref 5–15)
AST: 18 U/L (ref 15–41)
Albumin: 2.4 g/dL — ABNORMAL LOW (ref 3.5–5.0)
Alkaline Phosphatase: 114 U/L (ref 38–126)
BILIRUBIN TOTAL: 0.6 mg/dL (ref 0.3–1.2)
BUN: 6 mg/dL (ref 6–20)
CO2: 18 mmol/L — ABNORMAL LOW (ref 22–32)
Calcium: 8 mg/dL — ABNORMAL LOW (ref 8.9–10.3)
Chloride: 107 mmol/L (ref 98–111)
Creatinine, Ser: 0.48 mg/dL (ref 0.44–1.00)
GFR calc Af Amer: >60 mL/min (ref >60)
GFR calc non Af Amer: >60 mL/min (ref >60)
Glucose, Bld: 109 mg/dL — ABNORMAL HIGH (ref 70–99)
Potassium: 3.6 mmol/L (ref 3.5–5.1)
Sodium: 135 mmol/L (ref 135–145)
Total Protein: 5.3 g/dL — ABNORMAL LOW (ref 6.5–8.1)

## 2018-02-24 LAB — CBC
HCT: 33.2 % — ABNORMAL LOW (ref 36.0–46.0)
Hemoglobin: 10.9 g/dL — ABNORMAL LOW (ref 12.0–15.0)
MCH: 28.6 pg (ref 26.0–34.0)
MCHC: 32.8 g/dL (ref 30.0–36.0)
MCV: 87.1 fL (ref 80.0–100.0)
Platelets: 371 10*3/uL (ref 150–400)
RBC: 3.81 MIL/uL — ABNORMAL LOW (ref 3.87–5.11)
RDW: 17 % — ABNORMAL HIGH (ref 11.5–15.5)
WBC: 8.2 10*3/uL (ref 4.0–10.5)
nRBC: 0 % (ref 0.0–0.2)

## 2018-02-24 LAB — GLUCOSE, CAPILLARY
GLUCOSE-CAPILLARY: 111 mg/dL — AB (ref 70–99)
Glucose-Capillary: 85 mg/dL (ref 70–99)
Glucose-Capillary: 126 mg/dL — ABNORMAL HIGH (ref 70–99)
Glucose-Capillary: 96 mg/dL (ref 70–99)

## 2018-02-24 MED ORDER — PANTOPRAZOLE SODIUM 40 MG PO TBEC
40.0000 mg | DELAYED_RELEASE_TABLET | Freq: Every day | ORAL | Status: DC
  Administered 2018-02-24 – 2018-02-25 (×2): 40 mg via ORAL
  Filled 2018-02-24 (×2): qty 1

## 2018-02-24 MED ORDER — LABETALOL HCL 100 MG PO TABS: 100.0000 mg | ORAL_TABLET | Freq: Once | ORAL | Status: DC

## 2018-02-24 MED ORDER — LABETALOL HCL 200 MG PO TABS: 200.0000 mg | ORAL_TABLET | Freq: Three times a day (TID) | ORAL | Status: DC

## 2018-02-24 MED ORDER — LABETALOL HCL 200 MG PO TABS
300.0000 mg | ORAL_TABLET | Freq: Three times a day (TID) | ORAL | Status: DC
  Administered 2018-02-25: 300 mg via ORAL
  Filled 2018-02-24: qty 1

## 2018-02-24 MED ORDER — LABETALOL HCL 100 MG PO TABS
100.0000 mg | ORAL_TABLET | Freq: Once | ORAL | Status: AC
  Administered 2018-02-24: 100 mg via ORAL
  Filled 2018-02-24: qty 1

## 2018-02-24 MED ORDER — BENZONATATE 100 MG PO CAPS
100.0000 mg | ORAL_CAPSULE | Freq: Three times a day (TID) | ORAL | Status: DC | PRN
  Filled 2018-02-24: qty 1

## 2018-02-24 NOTE — Progress Notes (Signed)
33 4/7 Has some cough when lying down. No HA, no vision change, no epigastric pain  Vitals:   02/24/18 0441 02/24/18 0526  BP: (!) 160/95 (!) 141/77  Pulse: 94 89  Resp: 16   Temp: 97.9 F (36.6 C)   SpO2: 99% 99%   Patient smiling   NST reactive x 2  FBG=109  A/P: 1)Preeclampsia with severe features by BP               Magnesium sulfate off              Labetalol 200mg  BID              Labs Monday          2)DM             Metformin             SS insulin          3)Di/Di twins              Breech/Transverse              NST reactive x 2              BPP 6/8, 8/8  02/23/18              BPP Monday              C/S @ 34 weeks

## 2018-02-25 LAB — CBC
HCT: 31.4 % — ABNORMAL LOW (ref 36.0–46.0)
Hemoglobin: 10 g/dL — ABNORMAL LOW (ref 12.0–15.0)
MCH: 27.8 pg (ref 26.0–34.0)
MCHC: 31.8 g/dL (ref 30.0–36.0)
MCV: 87.2 fL (ref 80.0–100.0)
Platelets: 337 10*3/uL (ref 150–400)
RBC: 3.6 MIL/uL — ABNORMAL LOW (ref 3.87–5.11)
RDW: 16.9 % — ABNORMAL HIGH (ref 11.5–15.5)
WBC: 9.4 10*3/uL (ref 4.0–10.5)
nRBC: 0 % (ref 0.0–0.2)

## 2018-02-25 LAB — GLUCOSE, CAPILLARY
GLUCOSE-CAPILLARY: 84 mg/dL (ref 70–99)
GLUCOSE-CAPILLARY: 92 mg/dL (ref 70–99)
Glucose-Capillary: 69 mg/dL — ABNORMAL LOW (ref 70–99)
Glucose-Capillary: 80 mg/dL (ref 70–99)
Glucose-Capillary: 85 mg/dL (ref 70–99)

## 2018-02-25 LAB — COMPREHENSIVE METABOLIC PANEL
ALT: 10 U/L (ref 0–44)
AST: 18 U/L (ref 15–41)
Albumin: 2.2 g/dL — ABNORMAL LOW (ref 3.5–5.0)
Alkaline Phosphatase: 84 U/L (ref 38–126)
Anion gap: 8 (ref 5–15)
BUN: 9 mg/dL (ref 6–20)
CO2: 21 mmol/L — ABNORMAL LOW (ref 22–32)
Calcium: 8.2 mg/dL — ABNORMAL LOW (ref 8.9–10.3)
Chloride: 109 mmol/L (ref 98–111)
Creatinine, Ser: 0.46 mg/dL (ref 0.44–1.00)
GFR calc Af Amer: >60 mL/min (ref >60)
GFR calc non Af Amer: >60 mL/min (ref >60)
Glucose, Bld: 85 mg/dL (ref 70–99)
POTASSIUM: 3.2 mmol/L — AB (ref 3.5–5.1)
Sodium: 138 mmol/L (ref 135–145)
Total Bilirubin: 0.5 mg/dL (ref 0.3–1.2)
Total Protein: 5.2 g/dL — ABNORMAL LOW (ref 6.5–8.1)

## 2018-02-25 MED ORDER — LABETALOL HCL 100 MG PO TABS
100.0000 mg | ORAL_TABLET | Freq: Once | ORAL | Status: AC
  Administered 2018-02-25: 100 mg via ORAL
  Filled 2018-02-25: qty 1

## 2018-02-25 MED ORDER — LABETALOL HCL 200 MG PO TABS
400.0000 mg | ORAL_TABLET | Freq: Three times a day (TID) | ORAL | Status: DC
  Administered 2018-02-25 – 2018-02-26 (×3): 400 mg via ORAL
  Filled 2018-02-25 (×3): qty 2

## 2018-02-25 NOTE — Progress Notes (Signed)
33 5/7 Mild HA last pm>relieved with Tylenol. No vision change, no epigastric pain.  Vitals:   02/25/18 0651 02/25/18 0856  BP: (!) 157/86 (!) 156/99  Pulse: 79 75  Resp:    Temp:  98.6 F (37 C)  SpO2:  99%   Lungs CTA Cor RRR Abd no epigastric tenderness DTR 2+  FHT cat one x 2  Results for orders placed or performed during the hospital encounter of 02/22/18 (from the past 24 hour(s))  Glucose, capillary     Status: None   Collection Time: 02/24/18 10:15 AM  Result Value Ref Range   Glucose-Capillary 85 70 - 99 mg/dL  Glucose, capillary     Status: Abnormal   Collection Time: 02/24/18 12:13 PM  Result Value Ref Range   Glucose-Capillary 111 (H) 70 - 99 mg/dL  Glucose, capillary     Status: Abnormal   Collection Time: 02/24/18  4:50 PM  Result Value Ref Range   Glucose-Capillary 126 (H) 70 - 99 mg/dL  Glucose, capillary     Status: None   Collection Time: 02/24/18  7:26 PM  Result Value Ref Range   Glucose-Capillary 96 70 - 99 mg/dL  Comprehensive metabolic panel     Status: Abnormal   Collection Time: 02/25/18  6:54 AM  Result Value Ref Range   Sodium 138 135 - 145 mmol/L   Potassium 3.2 (L) 3.5 - 5.1 mmol/L   Chloride 109 98 - 111 mmol/L   CO2 21 (L) 22 - 32 mmol/L   Glucose, Bld 85 70 - 99 mg/dL   BUN 9 6 - 20 mg/dL   Creatinine, Ser 6.94 0.44 - 1.00 mg/dL   Calcium 8.2 (L) 8.9 - 10.3 mg/dL   Total Protein 5.2 (L) 6.5 - 8.1 g/dL   Albumin 2.2 (L) 3.5 - 5.0 g/dL   AST 18 15 - 41 U/L   ALT 10 0 - 44 U/L   Alkaline Phosphatase 84 38 - 126 U/L   Total Bilirubin 0.5 0.3 - 1.2 mg/dL   GFR calc non Af Amer >60 >60 mL/min   GFR calc Af Amer >60 >60 mL/min   Anion gap 8 5 - 15  CBC     Status: Abnormal   Collection Time: 02/25/18  6:54 AM  Result Value Ref Range   WBC 9.4 4.0 - 10.5 K/uL   RBC 3.60 (L) 3.87 - 5.11 MIL/uL   Hemoglobin 10.0 (L) 12.0 - 15.0 g/dL   HCT 85.4 (L) 62.7 - 03.5 %   MCV 87.2 80.0 - 100.0 fL   MCH 27.8 26.0 - 34.0 pg   MCHC 31.8 30.0  - 36.0 g/dL   RDW 00.9 (H) 38.1 - 82.9 %   Platelets 337 150 - 400 K/uL   nRBC 0.0 0.0 - 0.2 %   A/P:1) Preeclampsia with severe features by BP         She required IV labetalol this am for severe range BP         She received labetalol 300mg  po this am-will give additional labetalol 100mg  po now and change dosing to labetalol 400mg  po TID         Labs OK-repeat in am         2)DM            Metformin/SS insulin         3) Di/Di twins              Breech/Transverse  NST reactive x 2              C/S for delivery @ 34 weeks              BPP tomorrow

## 2018-02-25 NOTE — Progress Notes (Signed)
Hypoglycemic Event  CBG: 69  Treatment: pt received breakfast tray.  Symptoms: none  Follow-up CBG: Time: 10:35 CBG Result: 80  Possible Reasons for Event: unknown      Laury Axon

## 2018-02-26 ENCOUNTER — Encounter (HOSPITAL_COMMUNITY): Payer: Self-pay | Admitting: Obstetrics & Gynecology

## 2018-02-26 ENCOUNTER — Inpatient Hospital Stay (HOSPITAL_COMMUNITY): Payer: Managed Care, Other (non HMO) | Admitting: Anesthesiology

## 2018-02-26 ENCOUNTER — Other Ambulatory Visit: Payer: Managed Care, Other (non HMO)

## 2018-02-26 ENCOUNTER — Encounter (HOSPITAL_COMMUNITY)
Admission: AD | Disposition: A | Discharge status: Home / Self Care | Payer: Self-pay | Source: Home / Self Care | Attending: Obstetrics and Gynecology

## 2018-02-26 DIAGNOSIS — Z98891 History of uterine scar from previous surgery: Secondary | ICD-10-CM

## 2018-02-26 LAB — COMPREHENSIVE METABOLIC PANEL
ALT: 11 U/L (ref 0–44)
ALT: 12 U/L (ref 0–44)
ANION GAP: 9 (ref 5–15)
AST: 21 U/L (ref 15–41)
AST: 22 U/L (ref 15–41)
Albumin: 2.3 g/dL — ABNORMAL LOW (ref 3.5–5.0)
Albumin: 2.3 g/dL — ABNORMAL LOW (ref 3.5–5.0)
Alkaline Phosphatase: 56 U/L (ref 38–126)
Alkaline Phosphatase: 99 U/L (ref 38–126)
Anion gap: 16 — ABNORMAL HIGH (ref 5–15)
BUN: 10 mg/dL (ref 6–20)
BUN: 10 mg/dL (ref 6–20)
CO2: 17 mmol/L — AB (ref 22–32)
CO2: 19 mmol/L — ABNORMAL LOW (ref 22–32)
Calcium: <4 mg/dL — CL (ref 8.9–10.3)
Calcium: 8 mg/dL — ABNORMAL LOW (ref 8.9–10.3)
Chloride: 106 mmol/L (ref 98–111)
Chloride: 110 mmol/L (ref 98–111)
Creatinine, Ser: 0.47 mg/dL (ref 0.44–1.00)
Creatinine, Ser: 0.46 mg/dL (ref 0.44–1.00)
GFR calc Af Amer: >60 mL/min (ref >60)
GFR calc Af Amer: >60 mL/min (ref >60)
GFR calc non Af Amer: >60 mL/min (ref >60)
GFR calc non Af Amer: >60 mL/min (ref >60)
Glucose, Bld: 79 mg/dL (ref 70–99)
Glucose, Bld: 83 mg/dL (ref 70–99)
POTASSIUM: 2.9 mmol/L — AB (ref 3.5–5.1)
Potassium: >7.5 mmol/L (ref 3.5–5.1)
Sodium: 139 mmol/L (ref 135–145)
Sodium: 138 mmol/L (ref 135–145)
Total Bilirubin: 0.6 mg/dL (ref 0.3–1.2)
Total Bilirubin: 0.4 mg/dL (ref 0.3–1.2)
Total Protein: 5.4 g/dL — ABNORMAL LOW (ref 6.5–8.1)
Total Protein: 5.6 g/dL — ABNORMAL LOW (ref 6.5–8.1)

## 2018-02-26 LAB — GLUCOSE, CAPILLARY
Glucose-Capillary: 79 mg/dL (ref 70–99)
Glucose-Capillary: 90 mg/dL (ref 70–99)
Glucose-Capillary: 86 mg/dL (ref 70–99)
Glucose-Capillary: 93 mg/dL (ref 70–99)
Glucose-Capillary: 142 mg/dL — ABNORMAL HIGH (ref 70–99)

## 2018-02-26 LAB — CBC
HCT: 31 % — ABNORMAL LOW (ref 36.0–46.0)
HEMOGLOBIN: 9.9 g/dL — AB (ref 12.0–15.0)
MCH: 27.4 pg (ref 26.0–34.0)
MCHC: 31.9 g/dL (ref 30.0–36.0)
MCV: 85.9 fL (ref 80.0–100.0)
Platelets: 323 10*3/uL (ref 150–400)
RBC: 3.61 MIL/uL — ABNORMAL LOW (ref 3.87–5.11)
RDW: 16.7 % — ABNORMAL HIGH (ref 11.5–15.5)
WBC: 11.6 10*3/uL — ABNORMAL HIGH (ref 4.0–10.5)
nRBC: 0 % (ref 0.0–0.2)

## 2018-02-26 LAB — MAGNESIUM: Magnesium: 1.4 mg/dL — ABNORMAL LOW (ref 1.7–2.4)

## 2018-02-26 LAB — PREPARE RBC (CROSSMATCH)

## 2018-02-26 SURGERY — Surgical Case
Anesthesia: Spinal

## 2018-02-26 MED ORDER — SENNOSIDES-DOCUSATE SODIUM 8.6-50 MG PO TABS
2.0000 | ORAL_TABLET | ORAL | Status: DC
  Administered 2018-02-27 – 2018-03-01 (×4): 2 via ORAL
  Filled 2018-02-26 (×4): qty 2

## 2018-02-26 MED ORDER — KETOROLAC TROMETHAMINE 30 MG/ML IJ SOLN
30.0000 mg | Freq: Four times a day (QID) | INTRAMUSCULAR | Status: AC | PRN
  Administered 2018-02-26: 30 mg via INTRAVENOUS
  Filled 2018-02-26: qty 1

## 2018-02-26 MED ORDER — MAGNESIUM SULFATE BOLUS VIA INFUSION
6.0000 g | Freq: Once | INTRAVENOUS | Status: AC
  Administered 2018-02-26: 6 g via INTRAVENOUS
  Filled 2018-02-26: qty 500

## 2018-02-26 MED ORDER — ZOLPIDEM TARTRATE 5 MG PO TABS: 5.0000 mg | ORAL_TABLET | Freq: Every evening | ORAL | Status: DC | PRN

## 2018-02-26 MED ORDER — OXYTOCIN 10 UNIT/ML IJ SOLN
INTRAVENOUS | Status: DC | PRN
  Administered 2018-02-26: 40 [IU] via INTRAVENOUS

## 2018-02-26 MED ORDER — SODIUM CHLORIDE 0.9% IV SOLUTION: Freq: Once | INTRAVENOUS | Status: DC

## 2018-02-26 MED ORDER — FENTANYL CITRATE (PF) 100 MCG/2ML IJ SOLN
INTRAMUSCULAR | Status: DC | PRN
  Administered 2018-02-26: 15 ug via INTRATHECAL

## 2018-02-26 MED ORDER — ONDANSETRON HCL 4 MG/2ML IJ SOLN
INTRAMUSCULAR | Status: AC
  Filled 2018-02-26: qty 2

## 2018-02-26 MED ORDER — FENTANYL CITRATE (PF) 100 MCG/2ML IJ SOLN
INTRAMUSCULAR | Status: AC
  Filled 2018-02-26: qty 2

## 2018-02-26 MED ORDER — FERROUS SULFATE 325 (65 FE) MG PO TABS
325.0000 mg | ORAL_TABLET | Freq: Every day | ORAL | Status: DC
  Administered 2018-02-27 – 2018-03-02 (×4): 325 mg via ORAL
  Filled 2018-02-26 (×4): qty 1

## 2018-02-26 MED ORDER — LACTATED RINGERS IV SOLN
INTRAVENOUS | Status: DC
  Administered 2018-02-26: 07:00:00 via INTRAVENOUS

## 2018-02-26 MED ORDER — SODIUM CHLORIDE 0.9% FLUSH
3.0000 mL | INTRAVENOUS | Status: DC | PRN
  Administered 2018-03-01: 3 mL via INTRAVENOUS
  Filled 2018-02-26: qty 3

## 2018-02-26 MED ORDER — DIPHENHYDRAMINE HCL 25 MG PO CAPS: 25.0000 mg | ORAL_CAPSULE | Freq: Four times a day (QID) | ORAL | Status: DC | PRN

## 2018-02-26 MED ORDER — LACTATED RINGERS IV SOLN
INTRAVENOUS | Status: DC | PRN
  Administered 2018-02-26: 11:00:00 via INTRAVENOUS

## 2018-02-26 MED ORDER — MAGNESIUM SULFATE 40 G IN LACTATED RINGERS - SIMPLE
2.0000 g/h | INTRAVENOUS | Status: DC
  Administered 2018-02-26: 2 g/h via INTRAVENOUS
  Filled 2018-02-26: qty 500

## 2018-02-26 MED ORDER — LACTATED RINGERS IV SOLN
INTRAVENOUS | Status: DC
  Administered 2018-02-26 – 2018-02-27 (×2): via INTRAVENOUS

## 2018-02-26 MED ORDER — DIPHENHYDRAMINE HCL 25 MG PO CAPS
25.0000 mg | ORAL_CAPSULE | ORAL | Status: DC | PRN
  Administered 2018-03-02: 25 mg via ORAL
  Filled 2018-02-26 (×2): qty 1

## 2018-02-26 MED ORDER — OXYTOCIN 40 UNITS IN NORMAL SALINE INFUSION - SIMPLE MED: 2.5000 [IU]/h | INTRAVENOUS | Status: AC

## 2018-02-26 MED ORDER — FENTANYL CITRATE (PF) 100 MCG/2ML IJ SOLN
25.0000 ug | INTRAMUSCULAR | Status: DC | PRN
  Administered 2018-02-26: 50 ug via INTRAVENOUS
  Administered 2018-02-26 (×2): 25 ug via INTRAVENOUS

## 2018-02-26 MED ORDER — MENTHOL 3 MG MT LOZG: 1.0000 | LOZENGE | OROMUCOSAL | Status: DC | PRN

## 2018-02-26 MED ORDER — NALOXONE HCL 4 MG/10ML IJ SOLN
1.0000 ug/kg/h | INTRAVENOUS | Status: DC | PRN
  Filled 2018-02-26: qty 5

## 2018-02-26 MED ORDER — LACTATED RINGERS IV SOLN
INTRAVENOUS | Status: DC | PRN
  Administered 2018-02-26 (×2): via INTRAVENOUS

## 2018-02-26 MED ORDER — PHENYLEPHRINE 8 MG IN D5W 100 ML (0.08MG/ML) PREMIX OPTIME
INJECTION | INTRAVENOUS | Status: DC | PRN
  Administered 2018-02-26: 60 ug/min via INTRAVENOUS

## 2018-02-26 MED ORDER — NALBUPHINE HCL 10 MG/ML IJ SOLN: 5.0000 mg | Freq: Once | INTRAMUSCULAR | Status: DC | PRN

## 2018-02-26 MED ORDER — WITCH HAZEL-GLYCERIN EX PADS: 1.0000 "application " | MEDICATED_PAD | CUTANEOUS | Status: DC | PRN

## 2018-02-26 MED ORDER — MORPHINE SULFATE (PF) 0.5 MG/ML IJ SOLN
INTRAMUSCULAR | Status: AC
  Filled 2018-02-26: qty 10

## 2018-02-26 MED ORDER — BUPIVACAINE IN DEXTROSE 0.75-8.25 % IT SOLN
INTRATHECAL | Status: DC | PRN
  Administered 2018-02-26: 1.6 mL via INTRATHECAL

## 2018-02-26 MED ORDER — CEFAZOLIN SODIUM-DEXTROSE 2-4 GM/100ML-% IV SOLN
2.0000 g | Freq: Once | INTRAVENOUS | Status: AC
  Administered 2018-02-26: 2 g via INTRAVENOUS
  Filled 2018-02-26: qty 100

## 2018-02-26 MED ORDER — ACETAMINOPHEN 500 MG PO TABS
1000.0000 mg | ORAL_TABLET | Freq: Four times a day (QID) | ORAL | Status: AC
  Administered 2018-02-27 (×2): 1000 mg via ORAL
  Filled 2018-02-26 (×2): qty 2

## 2018-02-26 MED ORDER — SIMETHICONE 80 MG PO CHEW
80.0000 mg | CHEWABLE_TABLET | Freq: Three times a day (TID) | ORAL | Status: DC
  Administered 2018-02-26 – 2018-03-02 (×11): 80 mg via ORAL
  Filled 2018-02-26 (×11): qty 1

## 2018-02-26 MED ORDER — OXYCODONE-ACETAMINOPHEN 5-325 MG PO TABS
1.0000 | ORAL_TABLET | ORAL | Status: DC | PRN
  Administered 2018-02-26: 1 via ORAL
  Administered 2018-02-27 – 2018-02-28 (×5): 2 via ORAL
  Administered 2018-02-28: 1 via ORAL
  Administered 2018-02-28 – 2018-03-02 (×9): 2 via ORAL
  Filled 2018-02-26: qty 2
  Filled 2018-02-26: qty 1
  Filled 2018-02-26 (×10): qty 2
  Filled 2018-02-26: qty 1
  Filled 2018-02-26 (×3): qty 2

## 2018-02-26 MED ORDER — SODIUM CHLORIDE 0.9 % IR SOLN
Status: DC | PRN
  Administered 2018-02-26: 1

## 2018-02-26 MED ORDER — SOD CITRATE-CITRIC ACID 500-334 MG/5ML PO SOLN
30.0000 mL | Freq: Once | ORAL | Status: AC
  Administered 2018-02-26: 30 mL via ORAL

## 2018-02-26 MED ORDER — DIPHENHYDRAMINE HCL 50 MG/ML IJ SOLN
INTRAMUSCULAR | Status: AC
  Filled 2018-02-26: qty 1

## 2018-02-26 MED ORDER — KETOROLAC TROMETHAMINE 30 MG/ML IJ SOLN: 30.0000 mg | Freq: Four times a day (QID) | INTRAMUSCULAR | Status: AC | PRN

## 2018-02-26 MED ORDER — LABETALOL HCL 200 MG PO TABS
400.0000 mg | ORAL_TABLET | Freq: Three times a day (TID) | ORAL | Status: DC
  Administered 2018-02-26 – 2018-02-28 (×8): 400 mg via ORAL
  Filled 2018-02-26 (×8): qty 2

## 2018-02-26 MED ORDER — TETANUS-DIPHTH-ACELL PERTUSSIS 5-2.5-18.5 LF-MCG/0.5 IM SUSP: 0.5000 mL | Freq: Once | INTRAMUSCULAR | Status: DC

## 2018-02-26 MED ORDER — COCONUT OIL OIL
1.0000 "application " | TOPICAL_OIL | Status: DC | PRN
  Administered 2018-02-27: 1 via TOPICAL
  Filled 2018-02-26: qty 120

## 2018-02-26 MED ORDER — SIMETHICONE 80 MG PO CHEW: 80.0000 mg | CHEWABLE_TABLET | ORAL | Status: DC | PRN

## 2018-02-26 MED ORDER — IBUPROFEN 600 MG PO TABS
600.0000 mg | ORAL_TABLET | Freq: Four times a day (QID) | ORAL | Status: DC | PRN
  Administered 2018-02-27 – 2018-03-02 (×9): 600 mg via ORAL
  Filled 2018-02-26 (×9): qty 1

## 2018-02-26 MED ORDER — NALOXONE HCL 0.4 MG/ML IJ SOLN
0.4000 mg | INTRAMUSCULAR | Status: DC | PRN
  Filled 2018-02-26: qty 1

## 2018-02-26 MED ORDER — LABETALOL HCL 5 MG/ML IV SOLN
10.0000 mg | Freq: Once | INTRAVENOUS | Status: AC
  Administered 2018-02-26: 10 mg via INTRAVENOUS

## 2018-02-26 MED ORDER — ALBUMIN HUMAN 5 % IV SOLN
INTRAVENOUS | Status: AC
  Filled 2018-02-26: qty 250

## 2018-02-26 MED ORDER — SCOPOLAMINE 1 MG/3DAYS TD PT72
1.0000 | MEDICATED_PATCH | TRANSDERMAL | Status: DC
  Administered 2018-02-26: 1.5 mg via TRANSDERMAL
  Filled 2018-02-26: qty 1

## 2018-02-26 MED ORDER — OXYTOCIN 10 UNIT/ML IJ SOLN
INTRAMUSCULAR | Status: AC
  Filled 2018-02-26: qty 4

## 2018-02-26 MED ORDER — ONDANSETRON HCL 4 MG/2ML IJ SOLN: 4.0000 mg | Freq: Three times a day (TID) | INTRAMUSCULAR | Status: DC | PRN

## 2018-02-26 MED ORDER — MEPERIDINE HCL 25 MG/ML IJ SOLN: 6.2500 mg | INTRAMUSCULAR | Status: DC | PRN

## 2018-02-26 MED ORDER — SOD CITRATE-CITRIC ACID 500-334 MG/5ML PO SOLN
ORAL | Status: AC
  Administered 2018-02-26: 30 mL via ORAL
  Filled 2018-02-26: qty 15

## 2018-02-26 MED ORDER — DIPHENHYDRAMINE HCL 50 MG/ML IJ SOLN
12.5000 mg | INTRAMUSCULAR | Status: DC | PRN
  Administered 2018-02-26 – 2018-02-27 (×3): 12.5 mg via INTRAVENOUS
  Filled 2018-02-26 (×2): qty 1

## 2018-02-26 MED ORDER — ONDANSETRON HCL 4 MG/2ML IJ SOLN
INTRAMUSCULAR | Status: DC | PRN
  Administered 2018-02-26: 4 mg via INTRAVENOUS

## 2018-02-26 MED ORDER — ALBUMIN HUMAN 5 % IV SOLN
INTRAVENOUS | Status: DC | PRN
  Administered 2018-02-26: 11:00:00 via INTRAVENOUS

## 2018-02-26 MED ORDER — ARTIFICIAL TEARS OPHTHALMIC OINT
TOPICAL_OINTMENT | OPHTHALMIC | Status: AC
  Filled 2018-02-26: qty 3.5

## 2018-02-26 MED ORDER — NALBUPHINE HCL 10 MG/ML IJ SOLN: 5.0000 mg | INTRAMUSCULAR | Status: DC | PRN

## 2018-02-26 MED ORDER — DIBUCAINE 1 % RE OINT: 1.0000 "application " | TOPICAL_OINTMENT | RECTAL | Status: DC | PRN

## 2018-02-26 MED ORDER — SIMETHICONE 80 MG PO CHEW
80.0000 mg | CHEWABLE_TABLET | ORAL | Status: DC
  Administered 2018-02-27 – 2018-03-01 (×4): 80 mg via ORAL
  Filled 2018-02-26 (×4): qty 1

## 2018-02-26 MED ORDER — MORPHINE SULFATE (PF) 0.5 MG/ML IJ SOLN
INTRAMUSCULAR | Status: DC | PRN
  Administered 2018-02-26: .15 mg via INTRATHECAL

## 2018-02-26 MED ORDER — PRENATAL MULTIVITAMIN CH
1.0000 | ORAL_TABLET | Freq: Every day | ORAL | Status: DC
  Administered 2018-02-26 – 2018-03-02 (×5): 1 via ORAL
  Filled 2018-02-26 (×5): qty 1

## 2018-02-26 MED ORDER — PHENYLEPHRINE 8 MG IN D5W 100 ML (0.08MG/ML) PREMIX OPTIME
INJECTION | INTRAVENOUS | Status: AC
  Filled 2018-02-26: qty 100

## 2018-02-26 MED ORDER — LABETALOL HCL 5 MG/ML IV SOLN
INTRAVENOUS | Status: AC
  Filled 2018-02-26: qty 4

## 2018-02-26 MED ORDER — SCOPOLAMINE 1 MG/3DAYS TD PT72: 1.0000 | MEDICATED_PATCH | Freq: Once | TRANSDERMAL | Status: DC

## 2018-02-26 MED ORDER — SODIUM CHLORIDE 0.9 % IV SOLN
INTRAVENOUS | Status: DC | PRN
  Administered 2018-02-26: 12:00:00 via INTRAVENOUS

## 2018-02-26 SURGICAL SUPPLY — 40 items
BENZOIN TINCTURE PRP APPL 2/3 (GAUZE/BANDAGES/DRESSINGS) ×3 IMPLANT
CHLORAPREP W/TINT 26ML (MISCELLANEOUS) ×3 IMPLANT
CLAMP CORD UMBIL (MISCELLANEOUS) IMPLANT
CLOSURE WOUND 1/2 X4 (GAUZE/BANDAGES/DRESSINGS) ×2
CLOTH BEACON ORANGE TIMEOUT ST (SAFETY) ×3 IMPLANT
DERMABOND ADVANCED (GAUZE/BANDAGES/DRESSINGS)
DERMABOND ADVANCED .7 DNX12 (GAUZE/BANDAGES/DRESSINGS) IMPLANT
DRSG OPSITE POSTOP 4X10 (GAUZE/BANDAGES/DRESSINGS) ×3 IMPLANT
ELECT REM PT RETURN 9FT ADLT (ELECTROSURGICAL) ×3
ELECTRODE REM PT RTRN 9FT ADLT (ELECTROSURGICAL) ×1 IMPLANT
EXTRACTOR VACUUM KIWI (MISCELLANEOUS) IMPLANT
GAUZE SPONGE 4X4 12PLY STRL LF (GAUZE/BANDAGES/DRESSINGS) ×6 IMPLANT
GLOVE BIO SURGEON STRL SZ 6 (GLOVE) ×3 IMPLANT
GLOVE BIOGEL PI IND STRL 6 (GLOVE) ×2 IMPLANT
GLOVE BIOGEL PI IND STRL 7.0 (GLOVE) ×2 IMPLANT
GLOVE BIOGEL PI INDICATOR 6 (GLOVE) ×4
GLOVE BIOGEL PI INDICATOR 7.0 (GLOVE) ×4
GOWN STRL REUS W/TWL LRG LVL3 (GOWN DISPOSABLE) ×6 IMPLANT
KIT ABG SYR 3ML LUER SLIP (SYRINGE) ×3 IMPLANT
NDL SAFETY ECLIPSE 18X1.5 (NEEDLE) ×1 IMPLANT
NEEDLE HYPO 18GX1.5 SHARP (NEEDLE) ×2
NEEDLE HYPO 25X5/8 SAFETYGLIDE (NEEDLE) ×3 IMPLANT
NS IRRIG 1000ML POUR BTL (IV SOLUTION) ×3 IMPLANT
PACK C SECTION WH (CUSTOM PROCEDURE TRAY) ×3 IMPLANT
PAD ABD 7.5X8 STRL (GAUZE/BANDAGES/DRESSINGS) ×3 IMPLANT
PAD OB MATERNITY 4.3X12.25 (PERSONAL CARE ITEMS) ×3 IMPLANT
PENCIL SMOKE EVAC W/HOLSTER (ELECTROSURGICAL) ×3 IMPLANT
STRIP CLOSURE SKIN 1/2X4 (GAUZE/BANDAGES/DRESSINGS) ×4 IMPLANT
SUT CHROMIC 0 CTX 36 (SUTURE) ×9 IMPLANT
SUT MON AB 2-0 CT1 27 (SUTURE) ×3 IMPLANT
SUT PDS AB 0 CT1 27 (SUTURE) IMPLANT
SUT PLAIN 0 NONE (SUTURE) IMPLANT
SUT PLAIN 2 0 (SUTURE) ×2
SUT PLAIN ABS 2-0 CT1 27XMFL (SUTURE) ×1 IMPLANT
SUT VIC AB 0 CT1 36 (SUTURE) IMPLANT
SUT VIC AB 4-0 KS 27 (SUTURE) IMPLANT
SYRINGE 10CC LL (SYRINGE) ×3 IMPLANT
TOWEL OR 17X24 6PK STRL BLUE (TOWEL DISPOSABLE) ×3 IMPLANT
TRAY FOLEY W/BAG SLVR 14FR LF (SET/KITS/TRAYS/PACK) IMPLANT
WATER STERILE IRR 1000ML POUR (IV SOLUTION) ×3 IMPLANT

## 2018-02-26 NOTE — Anesthesia Procedure Notes (Signed)
Spinal  Patient location during procedure: OR Start time: 02/26/2018 10:20 AM End time: 02/26/2018 10:28 AM Staffing Anesthesiologist: Marcene Duos, MD Performed: anesthesiologist  Preanesthetic Checklist Completed: patient identified, site marked, surgical consent, pre-op evaluation, timeout performed, IV checked, risks and benefits discussed and monitors and equipment checked Spinal Block Patient position: sitting Prep: DuraPrep Patient monitoring: heart rate, cardiac monitor, continuous pulse ox and blood pressure Approach: midline Location: L3-4 Injection technique: single-shot Needle Needle type: Sprotte and Pencan  Needle gauge: 24 G Needle length: 9 cm Assessment Sensory level: T4

## 2018-02-26 NOTE — Transfer of Care (Signed)
Immediate Anesthesia Transfer of Care Note  Patient: Angela Powers  Procedure(s) Performed: CESAREAN SECTION MULTI-GESTATIONAL (N/A )  Patient Location: PACU  Anesthesia Type:Spinal  Level of Consciousness: awake  Airway & Oxygen Therapy: Patient Spontanous Breathing  Post-op Assessment: Report given to RN and Post -op Vital signs reviewed and stable  Post vital signs: stable  Last Vitals:  Vitals Value Taken Time  BP 136/82 02/26/2018 12:00 PM  Temp    Pulse 67 02/26/2018 12:10 PM  Resp 16 02/26/2018 12:10 PM  SpO2 96 % 02/26/2018 12:10 PM  Vitals shown include unvalidated device data.  Last Pain:  Vitals:   02/26/18 1148  TempSrc: Oral  PainSc: 0-No pain      Patients Stated Pain Goal: 2 (02/23/18 1800)  Complications: No apparent anesthesia complications

## 2018-02-26 NOTE — Anesthesia Preprocedure Evaluation (Signed)
Anesthesia Evaluation  Patient identified by MRN, date of birth, ID band Patient awake    Reviewed: Allergy & Precautions, NPO status , Patient's Chart, lab work & pertinent test results  Airway Mallampati: II  TM Distance: >3 FB Neck ROM: Full    Dental   Pulmonary neg pulmonary ROS,    breath sounds clear to auscultation       Cardiovascular hypertension,  Rhythm:Regular Rate:Normal     Neuro/Psych  Headaches,    GI/Hepatic negative GI ROS, Neg liver ROS,   Endo/Other  diabetes, Oral Hypoglycemic Agents  Renal/GU negative Renal ROS     Musculoskeletal   Abdominal   Peds  Hematology  (+) anemia ,   Anesthesia Other Findings   Reproductive/Obstetrics (+) Pregnancy                             Lab Results  Component Value Date   WBC 11.6 (H) 02/26/2018   HGB 9.9 (L) 02/26/2018   HCT 31.0 (L) 02/26/2018   MCV 85.9 02/26/2018   PLT 323 02/26/2018   Lab Results  Component Value Date   CREATININE 0.46 02/26/2018   BUN 10 02/26/2018   NA 138 02/26/2018   K 2.9 (L) 02/26/2018   CL 110 02/26/2018   CO2 19 (L) 02/26/2018    Anesthesia Physical Anesthesia Plan  ASA: III  Anesthesia Plan: Spinal   Post-op Pain Management:    Induction:   PONV Risk Score and Plan: 2 and Ondansetron, Dexamethasone and Treatment may vary due to age or medical condition  Airway Management Planned: Natural Airway  Additional Equipment:   Intra-op Plan:   Post-operative Plan:   Informed Consent: I have reviewed the patients History and Physical, chart, labs and discussed the procedure including the risks, benefits and alternatives for the proposed anesthesia with the patient or authorized representative who has indicated his/her understanding and acceptance.       Plan Discussed with: CRNA  Anesthesia Plan Comments:         Anesthesia Quick Evaluation

## 2018-02-26 NOTE — Progress Notes (Signed)
Angela Powers is a 28 y.o. G1P0 at [redacted]w[redacted]d by ultrasound admitted for pre-eclampsia with severe features HD5. S/P magnesium sulfate and BMZ. Patient has received po labetalol during this admission with plans for delivery tomorrow by C/S for di/di twins with Baby A breech.  This AM, patient required 20, 40 and 80 IV labetalol for severe range BPs.  T2DM well controlled.  Subjective: No HA, CP/SOB, RUQ pain, or visual disturbance.    Objective: BP 139/81 (BP Location: Right Arm)   Pulse 77   Temp 97.6 F (36.4 C) (Oral)   Resp 18   Ht 5\' 8"  (1.727 m)   Wt 114.5 kg   LMP 06/26/2017   SpO2 99%   BMI 38.39 kg/m  No intake/output data recorded. No intake/output data recorded.  FHT:  FHR: 130 x 2 bpm, variability: moderate,  accelerations:  Present,  decelerations:  Absent UC:   irregular, every 3-10 minutes SVE:    deferred  Labs: Lab Results  Component Value Date   WBC 11.6 (H) 02/26/2018   HGB 9.9 (L) 02/26/2018   HCT 31.0 (L) 02/26/2018   MCV 85.9 02/26/2018   PLT 323 02/26/2018    Assessment / Plan: Pre-eclampsia with severe features, Di/Di twins, Preexisting DM  Labor: n/a Preeclampsia:  start magnesium sulfate Fetal Wellbeing:  Category I and x 2 Pain Control:  spinal I/D:  n/a Anticipated MOD:  C/S.  Patient is counseled re: C/S including risk of bleeding, infection, scarring, and damage to surrounding structures.  She understands implications in future pregnancies to include abnormal placentation and uterine rupture.  All questions were answered and the patient wishes to proceed.  Mitchel Honour 02/26/2018, 9:17 AM

## 2018-02-26 NOTE — Op Note (Addendum)
Angela Powers PROCEDURE DATE: 02/22/2018 - 02/26/2018  PREOPERATIVE DIAGNOSIS: Intrauterine pregnancy at  [redacted]w[redacted]d weeks gestation, pre-eclampsia with severe features, Di/Di twins (Baby A breech), pre-existing DM   POSTOPERATIVE DIAGNOSIS: The same with postpartum hemorrhage (transfusion of 2u PRBCs)  PROCEDURE:  Primary Low Transverse Cesarean Section  SURGEON:  Dr. Mitchel Honour  INDICATIONS: Angela Powers is a 28 y.o. G1P0 at [redacted]w[redacted]d scheduled for cesarean section secondary to pre-eclampsia with severe features.  Patient was admitted 5 days ago for pre-eclampsia with severe features.  She received BMZ and magnesium on admission.  She was controlled with slow increasing in po labetalol until this morning when she required IV labetalol 20, 40, and 80 to control severe range blood pressures.  The risks of cesarean section discussed with the patient included but were not limited to: bleeding which may require transfusion or reoperation; infection which may require antibiotics; injury to bowel, bladder, ureters or other surrounding organs; injury to the fetus; need for additional procedures including hysterectomy in the event of a life-threatening hemorrhage; placental abnormalities wth subsequent pregnancies, incisional problems, thromboembolic phenomenon and other postoperative/anesthesia complications. The patient concurred with the proposed plan, giving informed written consent for the procedure.    FINDINGS: Baby A-Viable female infant in breech presentation, APGARs pending and weight pending.  Baby B-Viable female infant in transverse presentation (delivered breech), APGARs 9,9 and weight pending.  Clear amniotic fluid.  Intact placenta, three vessel cord.  Grossly normal uterus, ovaries and fallopian tubes. .   ANESTHESIA:  Spinal ESTIMATED BLOOD LOSS: 2000 ml SPECIMENS: Placenta sent to pathology COMPLICATIONS: None immediate  PROCEDURE IN DETAIL:  The patient received  intravenous antibiotics and was given magnesium sulfate bolus.  She had sequential compression devices applied to her lower extremities while in the preoperative area.  She was then taken to the operating room where spinal anesthesia was administered and was found to be adequate. She was then placed in a dorsal supine position with a leftward tilt.  Traxi retractor was applied to improve visualization.  Patient was prepped and draped in a sterile manner.  A foley catheter was placed into her bladder and attached to constant gravity.  After an adequate timeout was performed, a Pfannenstiel skin incision was made with scalpel and carried through to the underlying layer of fascia. The fascia was incised in the midline and this incision was extended bilaterally using the Mayo scissors. Kocher clamps were applied to the superior aspect of the fascial incision and the underlying rectus muscles were dissected off bluntly. A similar process was carried out on the inferior aspect of the facial incision. The rectus muscles were separated in the midline bluntly and the peritoneum was entered bluntly. Bladder flap was created sharply and developed bluntly.  Bladder blade was placed.  A transverse hysterotomy was made with a scalpel and extended bilaterally bluntly.  Anterior placenta noted.  The bladder blade was then removed. Twin A was successfully delivered using typical breech maneuvers, and cord was clamped and cut and infant was handed over to awaiting neonatology team. Twin B was then delivered using typical breech maneuvers.  Uterine massage was then administered and the placenta delivered intact with three-vessel cord. The uterus was cleared of clot and debris.  The hysterotomy was closed with 0 chromic.  A second imbricating suture of 0-chromic was used to reinforce the incision and aid in hemostasis.  The peritoneum and rectus muscles were noted to be hemostatic and were reapproximated using 2-0 monocryl in a  running  fashion.  The fascia was closed with 0-Vicryl in a running fashion with good restoration of anatomy.  The subcutaneus tissue was copiously irrigated and 3 interrupted plain gut sutures were placed.  The skin was closed with 4-0 vicryl in a subcuticular fashion.  Benzoin and steri-strips applied.  Honeycomb and pressure dressing applied.  Given the patient's starting hemoglobin of 9.9 and hemorrhage, I transfused 2 u PRBCs.  Pt tolerated the procedure will.  All counts were correct x2.  Pt went to the recovery room in stable condition.

## 2018-02-26 NOTE — Anesthesia Postprocedure Evaluation (Addendum)
Anesthesia Post Note  Patient: Angela Powers  Procedure(s) Performed: CESAREAN SECTION MULTI-GESTATIONAL (N/A )     Patient location during evaluation: PACU Anesthesia Type: Spinal Level of consciousness: awake and alert Pain management: pain level controlled Vital Signs Assessment: post-procedure vital signs reviewed and stable Respiratory status: spontaneous breathing and respiratory function stable Cardiovascular status: blood pressure returned to baseline and stable Postop Assessment: spinal receding Anesthetic complications: no    Last Vitals:  Vitals:   02/26/18 1525 02/26/18 1535  BP:    Pulse:    Resp:    Temp:    SpO2: 91% 90%    Last Pain:  Vitals:   02/26/18 1523  TempSrc: Oral  PainSc:    Pain Goal: Patients Stated Pain Goal: 2 (02/26/18 1340)                 Kennieth Rad

## 2018-02-26 NOTE — Progress Notes (Signed)
CRITICAL VALUE ALERT  Critical Value:    K+ =.7.5    Ca = <4.0  Date & Time Notied:  02/26/2018  @ 0740  Provider Notified: Dr. Henderson Cloud  Orders Received/Actions taken:  Repeat  CMP

## 2018-02-26 NOTE — Progress Notes (Addendum)
Vitals:   02/26/18 0553 02/26/18 0611 02/26/18 0627 02/26/18 0645  BP: (!) 165/93 (!) 174/97 (!) 163/93 (!) 159/91  Pulse: 79 71 73 74  Resp:      Temp:      TempSrc:      SpO2:  99%    Weight:      Height:        Pt had severe range pressures of 167/93 and  165/93 after a 15 minute recheck. 20 mg of labetalol pushed. Dr. Henderson Cloud notified. After 15 minutes, pressure still severe range 174/97. 40 mg of labetalol pushed. 15 minute recheck 163/93. 80 mg of labetalol pushed. Dr. Henderson Cloud notified of blood pressures and critical lab values. Received orders to start IV fluids and make patient NPO. CMP redrawn by lab. 15 minute recheck 159/91.

## 2018-02-27 DIAGNOSIS — O1490 Unspecified pre-eclampsia, unspecified trimester: Secondary | ICD-10-CM | POA: Diagnosis present

## 2018-02-27 LAB — TYPE AND SCREEN
ABO/RH(D): O POS
Antibody Screen: NEGATIVE
UNIT DIVISION: 0
Unit division: 0

## 2018-02-27 LAB — COMPREHENSIVE METABOLIC PANEL
ALT: 13 U/L (ref 0–44)
AST: 26 U/L (ref 15–41)
Albumin: 1.9 g/dL — ABNORMAL LOW (ref 3.5–5.0)
Alkaline Phosphatase: 65 U/L (ref 38–126)
Anion gap: 5 (ref 5–15)
BUN: 9 mg/dL (ref 6–20)
CO2: 24 mmol/L (ref 22–32)
Calcium: 7.5 mg/dL — ABNORMAL LOW (ref 8.9–10.3)
Chloride: 107 mmol/L (ref 98–111)
Creatinine, Ser: 0.53 mg/dL (ref 0.44–1.00)
GFR calc non Af Amer: >60 mL/min (ref >60)
Glucose, Bld: 105 mg/dL — ABNORMAL HIGH (ref 70–99)
Potassium: 3.2 mmol/L — ABNORMAL LOW (ref 3.5–5.1)
SODIUM: 136 mmol/L (ref 135–145)
Total Bilirubin: 0.4 mg/dL (ref 0.3–1.2)
Total Protein: 4.5 g/dL — ABNORMAL LOW (ref 6.5–8.1)

## 2018-02-27 LAB — BPAM RBC
BLOOD PRODUCT EXPIRATION DATE: 202003102359
Blood Product Expiration Date: 202003102359
ISSUE DATE / TIME: 202002171130
ISSUE DATE / TIME: 202002171130
Unit Type and Rh: 5100
Unit Type and Rh: 5100

## 2018-02-27 LAB — CBC
HCT: 26 % — ABNORMAL LOW (ref 36.0–46.0)
Hemoglobin: 8.4 g/dL — ABNORMAL LOW (ref 12.0–15.0)
MCH: 27.7 pg (ref 26.0–34.0)
MCHC: 32.3 g/dL (ref 30.0–36.0)
MCV: 85.8 fL (ref 80.0–100.0)
Platelets: 231 10*3/uL (ref 150–400)
RBC: 3.03 MIL/uL — ABNORMAL LOW (ref 3.87–5.11)
RDW: 16.2 % — ABNORMAL HIGH (ref 11.5–15.5)
WBC: 11 10*3/uL — ABNORMAL HIGH (ref 4.0–10.5)
nRBC: 0 % (ref 0.0–0.2)

## 2018-02-27 LAB — GLUCOSE, CAPILLARY
GLUCOSE-CAPILLARY: 94 mg/dL (ref 70–99)
Glucose-Capillary: 94 mg/dL (ref 70–99)
Glucose-Capillary: 73 mg/dL (ref 70–99)
Glucose-Capillary: 128 mg/dL — ABNORMAL HIGH (ref 70–99)

## 2018-02-27 MED ORDER — LACTATED RINGERS IV SOLN: INTRAVENOUS | Status: DC

## 2018-02-27 MED ORDER — MAGNESIUM SULFATE 40 G IN LACTATED RINGERS - SIMPLE
2.0000 g/h | INTRAVENOUS | Status: AC
  Administered 2018-02-27: 2 g/h via INTRAVENOUS
  Filled 2018-02-27: qty 500

## 2018-02-27 NOTE — Lactation Note (Signed)
This note was copied from a baby's chart. Lactation Consultation Note: Initial visit with this mom of twins born at 68 w 6 d in NICU. Mom reports she has pumped 2 times but did not obtain any Colostrum. Reviewed importance of frequent pumping- 8 times/24 hours to promote a good milk supply.  Mom states she does not plan to put babies to the breast- wants to pump and bottle feed EBM and formula. Has Medela pump for home. BF brochure and NICU booklet given to mom. Reviewed hand expression with mom. No questions at present. To call prn  Patient Name: Angela Powers [id number] Date: 02/27/2018 Reason for consult: Initial assessment;NICU baby;Late-preterm 34-36.6wks;Multiple gestation;Primapara   Maternal Data Formula Feeding for Exclusion: Yes Reason for exclusion: Mother's choice to formula and breast feed on admission Has patient been taught Hand Expression?: Yes Does the patient have breastfeeding experience prior to this delivery?: No  Feeding    LATCH Score                   Interventions    Lactation Tools Discussed/Used WIC Program: No Pump Review: Setup, frequency, and cleaning Initiated by:: RN Date initiated:: 02/26/18   Consult Status Consult Status: Follow-up Date: 02/28/18 Follow-up type: In-patient    Pamelia Hoit 02/27/2018, 8:12 AM

## 2018-02-27 NOTE — Anesthesia Postprocedure Evaluation (Signed)
Anesthesia Post Note  Patient: Angela Powers  Procedure(s) Performed: CESAREAN SECTION MULTI-GESTATIONAL (N/A )     Patient location during evaluation: OB High Risk Anesthesia Type: Spinal Level of consciousness: awake and alert Pain management: pain level controlled Vital Signs Assessment: post-procedure vital signs reviewed and stable Respiratory status: spontaneous breathing Cardiovascular status: stable Postop Assessment: no headache, patient able to bend at knees, no backache, no apparent nausea or vomiting, spinal receding, adequate PO intake and able to ambulate Anesthetic complications: no    Last Vitals:  Vitals:   02/26/18 2304 02/27/18 0351  BP: 120/78 131/77  Pulse: 62 61  Resp: 18 18  Temp: 36.7 C 36.7 C  SpO2: 99% 98%    Last Pain:  Vitals:   02/27/18 0351  TempSrc: Oral  PainSc:    Pain Goal: Patients Stated Pain Goal: 3 (02/26/18 2103)                 Salome Arnt

## 2018-02-27 NOTE — Progress Notes (Signed)
Subjective: Postpartum Day 1: Cesarean Delivery Patient reports tolerating PO, + flatus and no problems voiding.    Objective: Vital signs in last 24 hours: Temp:  [96.9 F (36.1 C)-99.3 F (37.4 C)] 98.2 F (36.8 C) (02/18 0733) Pulse Rate:  [61-78] 64 (02/18 0733) Resp:  [11-20] 18 (02/18 0733) BP: (112-171)/(69-92) 112/69 (02/18 0733) SpO2:  [90 %-100 %] 100 % (02/18 0733)  Physical Exam:  General: alert, cooperative, appears stated age and no distress Lochia: appropriate Uterine Fundus: firm Incision: healing well DVT Evaluation: No evidence of DVT seen on physical exam.  Recent Labs    02/26/18 0511 02/27/18 0517  HGB 9.9* 8.4*  HCT 31.0* 26.0*    Assessment/Plan: Status post Cesarean section. Postoperative course complicated by Severe Preeclampsia stable labs and BP on labetalol  D/C magnesium this am and increase activty Babys in NICU on oxygen Diabetes in control on Metformin and insulin SS.  Turner Daniels 02/27/2018, 8:38 AM

## 2018-02-28 LAB — GLUCOSE, CAPILLARY
Glucose-Capillary: 72 mg/dL (ref 70–99)
Glucose-Capillary: 72 mg/dL (ref 70–99)
Glucose-Capillary: 94 mg/dL (ref 70–99)
Glucose-Capillary: 102 mg/dL — ABNORMAL HIGH (ref 70–99)

## 2018-02-28 MED ORDER — HYDRALAZINE HCL 20 MG/ML IJ SOLN: 10.0000 mg | INTRAMUSCULAR | Status: DC | PRN

## 2018-02-28 MED ORDER — METFORMIN HCL 500 MG PO TABS
500.0000 mg | ORAL_TABLET | Freq: Two times a day (BID) | ORAL | Status: DC
  Administered 2018-02-28 – 2018-03-02 (×5): 500 mg via ORAL
  Filled 2018-02-28 (×5): qty 1

## 2018-02-28 MED ORDER — LABETALOL HCL 5 MG/ML IV SOLN
20.0000 mg | INTRAVENOUS | Status: DC | PRN
  Administered 2018-02-28 – 2018-03-01 (×2): 20 mg via INTRAVENOUS
  Filled 2018-02-28 (×2): qty 4

## 2018-02-28 MED ORDER — LABETALOL HCL 200 MG PO TABS
200.0000 mg | ORAL_TABLET | Freq: Once | ORAL | Status: AC
  Administered 2018-02-28: 200 mg via ORAL
  Filled 2018-02-28: qty 1

## 2018-02-28 MED ORDER — LABETALOL HCL 5 MG/ML IV SOLN
80.0000 mg | INTRAVENOUS | Status: DC | PRN
  Administered 2018-03-01: 80 mg via INTRAVENOUS
  Filled 2018-02-28: qty 16

## 2018-02-28 MED ORDER — LORATADINE 10 MG PO TABS
10.0000 mg | ORAL_TABLET | Freq: Every day | ORAL | Status: DC
  Administered 2018-02-28 – 2018-03-02 (×3): 10 mg via ORAL
  Filled 2018-02-28 (×3): qty 1

## 2018-02-28 MED ORDER — LABETALOL HCL 5 MG/ML IV SOLN
40.0000 mg | INTRAVENOUS | Status: DC | PRN
  Administered 2018-03-01 (×2): 40 mg via INTRAVENOUS
  Filled 2018-02-28 (×2): qty 8

## 2018-02-28 MED ORDER — LABETALOL HCL 200 MG PO TABS: 600.0000 mg | ORAL_TABLET | Freq: Two times a day (BID) | ORAL | Status: DC

## 2018-02-28 NOTE — Progress Notes (Signed)
Pt had 2 high BP. Dr. Elon Spanner notified and ordered 600 mg labetalol BID to start in AM and labetalol 20, 40, 80 protocol. Will give an additional dose of labetalol 200 mg tonight per order.

## 2018-02-28 NOTE — Progress Notes (Signed)
Subjective: Postpartum Day 2: Cesarean Delivery Patient reports incisional pain, tolerating PO, + flatus and no problems voiding.    Objective: Vital signs in last 24 hours: Temp:  [97.6 F (36.4 C)-98.6 F (37 C)] 98.6 F (37 C) (02/19 0816) Pulse Rate:  [61-75] 70 (02/19 0816) Resp:  [17-19] 18 (02/19 0816) BP: (124-150)/(68-89) 150/89 (02/19 0816) SpO2:  [98 %-100 %] 99 % (02/19 0816)  Physical Exam:  General: alert, cooperative and appears stated age Lochia: appropriate Uterine Fundus: firm Incision: healing well, no significant drainage, no dehiscence, no significant erythema DVT Evaluation: No evidence of DVT seen on physical exam. Negative Homan's sign. No cords or calf tenderness. No significant calf/ankle edema.  Recent Labs    02/26/18 0511 02/27/18 0517  HGB 9.9* 8.4*  HCT 31.0* 26.0*    Assessment/Plan: Status post Cesarean section c/b PPH of 2L. Hgb 8.4, no s/s anemia on exam or by c/o/vs.   Postoperative course complicated by Severe Preeclampsia stable labs and BP on labetalol 400mg  TID. S/p Mag x 24 hours pp. Di/di twins progressing well in the NICU T2DM - d/c SSI and restart metformin 500mg  BID   Ranae Pila 02/28/2018, 8:55 AM

## 2018-02-28 NOTE — Lactation Note (Signed)
This note was copied from a baby's chart. Lactation Consultation Note  Patient Name: Angela Powers [id number] Date: 02/28/2018 Reason for consult: Follow-up assessment;NICU baby;Multiple gestation;Preterm <34wks Mom is pumping 8-12 times/24 hours but not obtaining colostrum.  Reassured and discussed milk coming to volume.  Twins are 19 hours old.  She has a Medela pump at home.  No questions.  Encouraged to call with concerns prn.  Maternal Data    Feeding Feeding Type: Formula  LATCH Score                   Interventions    Lactation Tools Discussed/Used     Consult Status Consult Status: Follow-up Date: 03/01/18 Follow-up type: In-patient    Huston Foley 02/28/2018, 1:55 PM

## 2018-03-01 ENCOUNTER — Other Ambulatory Visit: Payer: Managed Care, Other (non HMO)

## 2018-03-01 LAB — GLUCOSE, CAPILLARY
Glucose-Capillary: 73 mg/dL (ref 70–99)
Glucose-Capillary: 68 mg/dL — ABNORMAL LOW (ref 70–99)
Glucose-Capillary: 84 mg/dL (ref 70–99)
Glucose-Capillary: 75 mg/dL (ref 70–99)
Glucose-Capillary: 115 mg/dL — ABNORMAL HIGH (ref 70–99)

## 2018-03-01 MED ORDER — OXYTOCIN 40 UNITS IN NORMAL SALINE INFUSION - SIMPLE MED: 2.5000 [IU]/h | INTRAVENOUS | Status: DC

## 2018-03-01 MED ORDER — NIFEDIPINE ER OSMOTIC RELEASE 30 MG PO TB24
30.0000 mg | ORAL_TABLET | Freq: Every day | ORAL | Status: DC
  Administered 2018-03-01 – 2018-03-02 (×2): 30 mg via ORAL
  Filled 2018-03-01 (×2): qty 1

## 2018-03-01 MED ORDER — LABETALOL HCL 200 MG PO TABS
800.0000 mg | ORAL_TABLET | Freq: Three times a day (TID) | ORAL | Status: DC
  Administered 2018-03-01 – 2018-03-02 (×3): 800 mg via ORAL
  Filled 2018-03-01 (×3): qty 4

## 2018-03-01 MED ORDER — LABETALOL HCL 200 MG PO TABS
600.0000 mg | ORAL_TABLET | Freq: Three times a day (TID) | ORAL | Status: DC
  Administered 2018-03-01: 600 mg via ORAL
  Filled 2018-03-01: qty 3

## 2018-03-01 NOTE — Progress Notes (Signed)
PPD #3  No C/O, No HA, No vision change  Vitals:   03/01/18 0838 03/01/18 0903  BP: (!) 172/95 (!) 157/85  Pulse: 68 64  Resp:    Temp:    SpO2:     Lungs CTA Cor RRR Abd soft, incision OK DTR 2+  A/P: S/P C/S          Severe Preeclampsia post partum with labile BP. Has been on labetalol 400mg  BID. Was given an extra 200mg  po last pm and order changed to 600mg  BID. Before am dose due she required IV labetalol for severe range BP. Now BP 157/85. Will give labetalol 600mg  po TID. If BP continues to be labile will consider nifedipine.

## 2018-03-01 NOTE — Progress Notes (Signed)
Patient's BP was severe range 176/99 and 172/95. Dr. Henderson Cloud notified and hypertensive protocol started.

## 2018-03-01 NOTE — Progress Notes (Signed)
No C/O , Feels good  Vitals:   03/01/18 1415 03/01/18 1438  BP: (!) 161/84 (!) 150/84  Pulse:  69  Resp:    Temp:    SpO2:     BP again into severe range requiring IV labetalol up to 80mg  dose  A/P: D/W patient         Increase labetalol to 800mg  TID         Start nifedipine XL 30 mg po QD

## 2018-03-01 NOTE — Lactation Note (Signed)
This note was copied from a baby's chart. Lactation Consultation Note  Patient Name: Angela Powers [id number] Date: 03/01/2018  Mom is pumping every 3 hours and recently pumped 15 mls.  Mom happy to see some volume.  Instructed to bring all her pump pieces home and with her when she visits babies.  Provided with bottles.  Encouraged to call with questions or concerns.   Maternal Data    Feeding Feeding Type: Formula  LATCH Score                   Interventions    Lactation Tools Discussed/Used     Consult Status      Huston Foley 03/01/2018, 11:56 AM

## 2018-03-02 LAB — GLUCOSE, CAPILLARY: GLUCOSE-CAPILLARY: 73 mg/dL (ref 70–99)

## 2018-03-02 MED ORDER — LABETALOL HCL 200 MG PO TABS: 800.0000 mg | ORAL_TABLET | Freq: Three times a day (TID) | ORAL | 2 refills | Status: DC

## 2018-03-02 MED ORDER — NIFEDIPINE ER 30 MG PO TB24: 30.0000 mg | ORAL_TABLET | Freq: Every day | ORAL | 2 refills | Status: DC

## 2018-03-02 MED ORDER — OXYCODONE-ACETAMINOPHEN 5-325 MG PO TABS: 1.0000 | ORAL_TABLET | ORAL | 0 refills | Status: DC | PRN

## 2018-03-02 MED ORDER — IBUPROFEN 600 MG PO TABS: 600.0000 mg | ORAL_TABLET | Freq: Four times a day (QID) | ORAL | 0 refills | Status: DC | PRN

## 2018-03-02 NOTE — Discharge Summary (Signed)
Obstetric Discharge Summary Reason for Admission: elevated blood pressure Prenatal Procedures: Preeclampsia and ultrasound Intrapartum Procedures: cesarean: low cervical, transverse Postpartum Procedures: none Complications-Operative and Postpartum: none Hemoglobin  Date Value Ref Range Status  02/27/2018 8.4 (L) 12.0 - 15.0 g/dL Final   HCT  Date Value Ref Range Status  02/27/2018 26.0 (L) 36.0 - 46.0 % Final    Physical Exam:  General: alert and cooperative Lochia: appropriate Uterine Fundus: firm Incision: healing well, no significant drainage DVT Evaluation: No evidence of DVT seen on physical exam.  Discharge Diagnoses: Preelampsia  Discharge Information: Date: 03/02/2018 Activity: pelvic rest Diet: routine Medications: PNV, Ibuprofen, Percocet and labetalol, nifedapine, metformin Condition: stable Instructions: refer to practice specific booklet Discharge to: home Follow-up Information    [place] Schedule an appointment as soon as possible for a visit in 1 week(s).   Contact information: [address] [place] [address] [phone]           Newborn Data:   Jacia, Wingert [[id number]]  Live born female  Birth Weight: 5 lb 1.1 oz (2300 g) APGAR: 8, 7  Newborn Delivery   Birth date/time:  02/26/2018 10:51:00 Delivery type:  C-Section, Low Transverse Trial of labor:  No C-section categorization:  Primary      Arlesia, Zaccaria [[id number]]  Live born female  Birth Weight: 4 lb 12.9 oz (2180 g) APGAR: 9, 9  Newborn Delivery   Birth date/time:  02/26/2018 10:53:00 Delivery type:  C-Section, Low Transverse Trial of labor:  No C-section categorization:  Primary     Home with NICU.  Zelphia Cairo 03/02/2018, 8:55 AM

## 2018-03-02 NOTE — Lactation Note (Signed)
This note was copied from a baby's chart. Lactation Consultation Note  Patient Name: Angela Powers [id number] Date: 03/02/2018 Reason for consult: Follow-up assessment;Preterm <34wks;NICU baby;Primapara;1st time breastfeeding;Multiple gestation  P2 mother whose 33+6 week twins are in the NICU.  Mother plans to pump and bottle feed only.  Mother has been pumping consistently and is obtaining approximately 15 mls of EBM per feeding.  Mother was excited to share that her volume is increasing.  She denies pain with pumping.   Engorgement prevention/treatment discussed.  Mother has a manual pump and a DEBP for home use.  She has our OP phone number for questions after discharge.  Reviewed pumping at baby's bedside when visiting and how to transport milk safely from home to the hospital.  RN in room for discharge instructions.     Maternal Data Formula Feeding for Exclusion: No Has patient been taught Hand Expression?: Yes Does the patient have breastfeeding experience prior to this delivery?: No  Feeding Feeding Type: Breast Milk with Formula added  LATCH Score                   Interventions    Lactation Tools Discussed/Used     Consult Status Consult Status: Complete Date: 03/02/18 Follow-up type: Call as needed    Maigen Mozingo R Cayla Wiegand 03/02/2018, 11:56 AM

## 2018-03-04 ENCOUNTER — Inpatient Hospital Stay (HOSPITAL_COMMUNITY)
Admission: AD | Admit: 2018-03-04 | Discharge: 2018-03-04 | Disposition: A | Discharge status: Home / Self Care | Payer: Managed Care, Other (non HMO) | Attending: Obstetrics and Gynecology | Admitting: Obstetrics and Gynecology

## 2018-03-04 ENCOUNTER — Other Ambulatory Visit: Payer: Self-pay

## 2018-03-04 ENCOUNTER — Encounter (HOSPITAL_COMMUNITY): Payer: Self-pay | Admitting: *Deleted

## 2018-03-04 DIAGNOSIS — O902 Hematoma of obstetric wound: Secondary | ICD-10-CM | POA: Insufficient documentation

## 2018-03-04 DIAGNOSIS — O9089 Other complications of the puerperium, not elsewhere classified: Secondary | ICD-10-CM | POA: Diagnosis present

## 2018-03-04 NOTE — MAU Provider Note (Signed)
History     CSN: [id number]  Arrival date and time: 03/04/18 1142   First Provider Initiated Contact with Patient 03/04/18 1209      Chief Complaint  Patient presents with  . Drainage from Incision   Angela Powers is a 28 y.o. G1P0102 who is 6 days s/p pLTCS. She was seen at the [facility] regional ED this morning due to clear fluid leaking from incision. CT scan showed possible seroma. Wound was packed at that time. Patient left there and was on her way here to see her babies in the NICU, and states that the dressing became saturated.   Wound Check  She was originally treated 5 to 10 days ago. Prior ED Treatment: c-section  Maximum temperature: no fever  There has been clear discharge from the wound. There is no redness present. There is no swelling present. There is no pain present.    OB History    Gravida  1   Para  1   Term  0   Preterm  1   AB  0   Living  2     SAB  0   TAB  0   Ectopic  0   Multiple  0   Live Births  2           Past Medical History:  Diagnosis Date  . Allergy   . Anxiety   . Depression   . Diabetes mellitus without complication (HCC)   . Migraine headache   . Nephrolithiasis     Past Surgical History:  Procedure Laterality Date  . CESAREAN SECTION MULTI-GESTATIONAL N/A 02/26/2018   Procedure: CESAREAN SECTION MULTI-GESTATIONAL;  Surgeon: Mitchel Honour, DO;  Location: [facility];  Service: Obstetrics;  Laterality: N/A;    Family History  Problem Relation Age of Onset  . Mental illness Mother     Social History   Tobacco Use  . Smoking status: Never Smoker  . Smokeless tobacco: Never Used  Substance Use Topics  . Alcohol use: No  . Drug use: No    Allergies: No Known Allergies  Medications Prior to Admission  Medication Sig Dispense Refill Last Dose  . cromolyn (NASALCROM) 5.2 MG/ACT nasal spray Place 1 spray into both nostrils 3 (three) times daily as needed for allergies or rhinitis. 26 mL 1  02/21/2018 at Unknown time  . ferrous sulfate 325 (65 FE) MG tablet Take 325 mg by mouth daily with breakfast.   Past Week at Unknown time  . ibuprofen (ADVIL,MOTRIN) 600 MG tablet Take 1 tablet (600 mg total) by mouth every 6 (six) hours as needed for mild pain. 30 tablet 0   . labetalol (NORMODYNE) 200 MG tablet Take 4 tablets (800 mg total) by mouth 3 (three) times daily. 30 tablet 2   . metFORMIN (GLUCOPHAGE) 500 MG tablet TAKE 1 TABLET BY MOUTH 2 TIMES DAILY WITH A MEAL. (Patient taking differently: Take 500 mg by mouth 2 (two) times daily with a meal. ) 180 tablet 1 02/21/2018 at Unknown time  . NIFEdipine (ADALAT CC) 30 MG 24 hr tablet Take 1 tablet (30 mg total) by mouth daily. 30 tablet 2   . oxyCODONE-acetaminophen (PERCOCET/ROXICET) 5-325 MG tablet Take 1-2 tablets by mouth every 4 (four) hours as needed for moderate pain. 30 tablet 0   . Prenatal Vit-Fe Fumarate-FA (PRENATAL VITAMIN PO) Take by mouth daily.   02/22/2018 at Unknown time    Review of Systems  Constitutional: Negative for chills and fever.  Gastrointestinal: Negative for nausea and vomiting.  Genitourinary: Negative for pelvic pain and vaginal bleeding.   Physical Exam   Blood pressure 133/90, pulse 71, temperature 98.2 F (36.8 C), resp. rate 16, weight 104.3 kg, SpO2 100 %, unknown if currently breastfeeding.  Physical Exam  Nursing note and vitals reviewed. Constitutional: She is oriented to person, place, and time. She appears well-developed and well-nourished. No distress.  HENT:  Head: Normocephalic.  Cardiovascular: Normal rate.  Respiratory: Effort normal.  GI: Soft. There is no abdominal tenderness. There is no rebound.  Neurological: She is alert and oriented to person, place, and time.  Skin: Skin is warm and dry.  Psychiatric: She has a normal mood and affect.    MAU Course  Procedures  MDM Wound packing from WF removed. There was only a very small piece of packing. Approx 10cm. Small amount of  serosanguinous drainage noted. Incision is without erythema. Non-tender. Iodoform gauze used to repack the wound and pressure dressing applied. Taught the patient how to reapply pressure dressing. Advised her to FU with her OB office tomorrow for repacking of the wound.   Assessment and Plan   1. Cesarean section wound seroma, postpartum    DC home Comfort measures reviewed  RX: none  Return to MAU as needed FU with OB office tomorrow for wound check and possible re-packing   Follow-up Information    Morris, Megan, DO Follow up.   Specialty:  Obstetrics and Gynecology Contact information: [address], [address] [place] [address] [phone]           Marnae Chilton DNP, CNM  03/04/18  12:32 PM

## 2018-03-04 NOTE — MAU Note (Signed)
C/S on 02/26/18 TWINS Drainage from incision site, went to [facility] this morning and physician packed her incision and the packing was saturated by the time she got home

## 2018-03-04 NOTE — Discharge Instructions (Signed)
Seroma  A seroma is a collection of fluid on the body that looks like swelling or a mass. Seromas form where tissue has been injured or cut. Seromas vary in size. Some are small and painless. Others may become large and cause pain or discomfort. Many seromas go away on their own as the fluid is naturally absorbed by the body, and some seromas need to be drained.  What are the causes?  Seromas form as the result of damage to tissue or the removal of tissue. This tissue damage may occur during surgery or because of an injury or trauma. When tissue is disrupted or removed, empty space is created. The body’s natural defense system (immune system) causes fluid to enter the empty space and form a seroma.  What are the signs or symptoms?  Symptoms of this condition include:  · Swelling at the site of a surgical cut (incision) or an injury.  · Drainage of clear fluid at the surgery or injury site.  · Discomfort or pain.  How is this diagnosed?  This condition is diagnosed based on your symptoms, your medical history, and a physical exam. During the exam, your health care provider will press on the seroma. You may also have tests, including:  · Blood tests.  · Imaging tests, such as an ultrasound or CT scan.  How is this treated?  Some seromas go away (resolve) on their own. Your health care provider may monitor you to make sure the seroma does not cause any complications. If your seroma does not resolve on its own, treatment may include:  · Using a needle to drain the fluid from the seroma (needle aspiration).  · Inserting a flexible tube (catheter) to drain the fluid.  · Applying a bandage (dressing), such as an elastic bandage or binder.  · Antibiotic medicines, if the seroma becomes infected.  In rare cases, surgery may be done to remove the seroma and repair the area.  Follow these instructions at home:    · If you were prescribed an antibiotic medicine, take it as told by your health care provider. Do not stop taking  the antibiotic even if you start to feel better.  · Return to your normal activities as told by your health care provider. Ask your health care provider what activities are safe for you.  · Take over-the-counter and prescription medicines only as told by your health care provider.  · Check your seroma every day for signs of infection. Check for:  ? Redness or pain.  ? Fluid or pus.  ? More swelling.  ? Warmth.  · Keep all follow-up visits as told by your health care provider. This is important.  Contact a health care provider if:  · You have a fever.  · You have redness or pain at the site of the seroma.  · You have fluid or pus coming from the seroma.  · Your seroma is more swollen or is getting bigger.  · Your seroma is warm to the touch.  This information is not intended to replace advice given to you by your health care provider. Make sure you discuss any questions you have with your health care provider.  Document Released: 04/23/2012 Document Revised: 10/09/2015 Document Reviewed: 10/09/2015  Elsevier Interactive Patient Education © 2019 [employer] Inc[employer]

## 2018-03-08 ENCOUNTER — Other Ambulatory Visit: Payer: Managed Care, Other (non HMO)

## 2018-03-12 ENCOUNTER — Other Ambulatory Visit: Payer: Managed Care, Other (non HMO)

## 2018-03-14 ENCOUNTER — Ambulatory Visit: Payer: Self-pay

## 2018-03-14 NOTE — Lactation Note (Signed)
This note was copied from a baby's chart. Lactation Consultation Note  Patient Name: Joleesa Pavelka PJPET'K Date: 03/14/2018 Reason for consult: Follow-up assessment;Primapara;1st time breastfeeding;Late-preterm 34-36.6wks;Multiple gestation;NICU baby;Infant < 6lbs  Visited with Mom of twins in the NICU.  Baby's adjusted age is [redacted]w[redacted]d.  Both babies being bottle fed, along with NG feedings. Mom not interested in latching babies to the breast.  Pumping 8-9 times per day.  Mom exhausted with pumping.  Obtaining about 1 oz per pumping session.    Tips to increase milk supply: Talked about adding a "power pumping" once a day to simulate baby's growth spurt.  Mom encouraged to have babies STS when holding them (baby dressed and lying on Mom's chest over her clothes).  Pulled privacy curtain in case Mom decides to.  Also, talked about letting baby latch to breast, and then pump after.  Mom not sure about that, wanted to share tips to stimulate her milk supply.  Mom not waking at night to pump.  Talked about prolactin hormone being higher during nighttime.  Suggested she set alarm for one pumping around 3-4 am.  Encouraged breast massage and hand expression along with double pumping.    Encouraged Mom to call prn for concerns. Interventions Interventions: Skin to skin;Breast massage;Hand express;DEBP  Lactation Tools Discussed/Used Tools: Pump Breast pump type: Double-Electric Breast Pump   Consult Status Consult Status: Follow-up Date: 03/14/18 Follow-up type: Call as needed    Judee Clara 03/14/2018, 2:50 PM

## 2018-03-15 ENCOUNTER — Other Ambulatory Visit: Payer: Managed Care, Other (non HMO)

## 2018-03-19 ENCOUNTER — Other Ambulatory Visit: Payer: Managed Care, Other (non HMO)

## 2018-03-22 ENCOUNTER — Other Ambulatory Visit: Payer: Managed Care, Other (non HMO)

## 2018-03-26 ENCOUNTER — Other Ambulatory Visit: Payer: Managed Care, Other (non HMO)

## 2018-03-27 ENCOUNTER — Inpatient Hospital Stay (HOSPITAL_COMMUNITY): Admit: 2018-03-27 | Payer: Managed Care, Other (non HMO) | Admitting: Obstetrics and Gynecology

## 2018-03-27 ENCOUNTER — Encounter (HOSPITAL_COMMUNITY): Payer: Self-pay

## 2018-03-27 SURGERY — Surgical Case
Anesthesia: Choice

## 2018-04-20 ENCOUNTER — Other Ambulatory Visit: Payer: Self-pay | Admitting: Family Medicine

## 2018-04-20 DIAGNOSIS — E1165 Type 2 diabetes mellitus with hyperglycemia: Secondary | ICD-10-CM

## 2018-10-30 ENCOUNTER — Other Ambulatory Visit: Payer: Self-pay | Admitting: Family Medicine

## 2018-10-30 DIAGNOSIS — E1165 Type 2 diabetes mellitus with hyperglycemia: Secondary | ICD-10-CM

## 2018-11-02 ENCOUNTER — Other Ambulatory Visit: Payer: Self-pay | Admitting: Family Medicine

## 2018-11-02 DIAGNOSIS — E1165 Type 2 diabetes mellitus with hyperglycemia: Secondary | ICD-10-CM

## 2018-11-02 NOTE — Telephone Encounter (Signed)
Message routed to PCP CMA  

## 2018-11-02 NOTE — Telephone Encounter (Signed)
Medication Refill - Medication: metFORMIN (GLUCOPHAGE) 500 MG tablet   Preferred Pharmacy:  [id number] - [place], [place] - [address]  [address] [place] [address]  Phone: [phone] Fax: [phone]    Pt was advised that RX refills may take up to 3 business days. We ask that you follow-up with your pharmacy.

## 2018-11-02 NOTE — Telephone Encounter (Signed)
Requested medication (s) are due for refill today: yes  Requested medication (s) are on the active medication list: yes  Last refill:  04/20/2018  Future visit scheduled: no  Notes to clinic:  Review for refill   Requested Prescriptions  Pending Prescriptions Disp Refills   metFORMIN (GLUCOPHAGE) 500 MG tablet 180 tablet 1    Sig: Take 1 tablet (500 mg total) by mouth 2 (two) times daily with a meal.     Endocrinology:  Diabetes - Biguanides Failed - 11/02/2018 10:28 AM      Failed - HBA1C is between 0 and 7.9 and within 180 days    Hgb A1c MFr Bld  Date Value Ref Range Status  08/09/2017 6.9 (H) 4.6 - 6.5 % Final    Comment:    Glycemic Control Guidelines for People with Diabetes:Non Diabetic:  <6%Goal of Therapy: <7%Additional Action Suggested:  >8%          Failed - Valid encounter within last 6 months    Recent Outpatient Visits          9 months ago Acute non-recurrent maxillary sinusitis   [facility], Malka So, MD   1 year ago Generalized anxiety disorder   [facility], Malka So, MD   1 year ago Uncontrolled type 2 diabetes mellitus with hyperglycemia ([place])   [facility], Malka So, MD   1 year ago Generalized anxiety disorder   [facility], Malka So, MD   2 years ago Insomnia, unspecified type   [facility], Malka So, MD             Passed - Cr in normal range and within 360 days    Creatinine, Ser  Date Value Ref Range Status  02/27/2018 0.53 0.44 - 1.00 mg/dL Final         Passed - eGFR in normal range and within 360 days    GFR calc Af Amer  Date Value Ref Range Status  02/27/2018 >60 >60 mL/min Final   GFR calc non Af Amer  Date Value Ref Range Status  02/27/2018 >60 >60 mL/min Final   GFR  Date Value Ref Range Status  08/09/2017 169.24 >60.00 mL/min Final

## 2018-11-05 NOTE — Telephone Encounter (Signed)
This has been taking care of.

## 2019-06-02 ENCOUNTER — Emergency Department (HOSPITAL_BASED_OUTPATIENT_CLINIC_OR_DEPARTMENT_OTHER)
Admission: EM | Admit: 2019-06-02 | Discharge: 2019-06-02 | Disposition: A | Discharge status: Home / Self Care | Payer: Managed Care, Other (non HMO) | Attending: Emergency Medicine | Admitting: Emergency Medicine

## 2019-06-02 ENCOUNTER — Other Ambulatory Visit: Payer: Self-pay

## 2019-06-02 ENCOUNTER — Encounter (HOSPITAL_BASED_OUTPATIENT_CLINIC_OR_DEPARTMENT_OTHER): Payer: Self-pay | Admitting: *Deleted

## 2019-06-02 DIAGNOSIS — Z79899 Other long term (current) drug therapy: Secondary | ICD-10-CM | POA: Insufficient documentation

## 2019-06-02 DIAGNOSIS — E119 Type 2 diabetes mellitus without complications: Secondary | ICD-10-CM | POA: Insufficient documentation

## 2019-06-02 DIAGNOSIS — R202 Paresthesia of skin: Secondary | ICD-10-CM | POA: Insufficient documentation

## 2019-06-02 DIAGNOSIS — M5442 Lumbago with sciatica, left side: Secondary | ICD-10-CM | POA: Insufficient documentation

## 2019-06-02 DIAGNOSIS — Z7984 Long term (current) use of oral hypoglycemic drugs: Secondary | ICD-10-CM | POA: Diagnosis not present

## 2019-06-02 DIAGNOSIS — M545 Low back pain: Secondary | ICD-10-CM | POA: Diagnosis present

## 2019-06-02 DIAGNOSIS — M5440 Lumbago with sciatica, unspecified side: Secondary | ICD-10-CM

## 2019-06-02 MED ORDER — HYDROCODONE-ACETAMINOPHEN 5-325 MG PO TABS
1.0000 | ORAL_TABLET | Freq: Once | ORAL | Status: AC
  Administered 2019-06-02: 1 via ORAL
  Filled 2019-06-02: qty 1

## 2019-06-02 MED ORDER — KETOROLAC TROMETHAMINE 60 MG/2ML IM SOLN
30.0000 mg | Freq: Once | INTRAMUSCULAR | Status: AC
  Administered 2019-06-02: 30 mg via INTRAMUSCULAR
  Filled 2019-06-02: qty 2

## 2019-06-02 MED ORDER — METHYLPREDNISOLONE 4 MG PO TBPK: ORAL_TABLET | ORAL | 0 refills | Status: DC

## 2019-06-02 NOTE — ED Triage Notes (Signed)
Pt reports back pain x 1 month. Pain radiates down left leg. She has tried heat, ice, rest, flexeril, steroids, aleve, and advil without success

## 2019-06-02 NOTE — Discharge Instructions (Addendum)
Take Motrin 800 mg every 8 hours for the next 5 days.  Take Tylenol as needed and muscle relaxant as needed as well.  Follow-up with your primary care doctor.

## 2019-06-02 NOTE — ED Provider Notes (Signed)
[facility] Provider Note   CSN: [id number] Arrival date & time: 06/02/19  2108     History Chief Complaint  Patient presents with  . Back Pain    Angela Powers is a 29 y.o. female.  The history is provided by the patient.  Back Pain Location:  Lumbar spine Quality:  Aching Radiates to:  L knee Pain severity:  Mild Onset quality:  Gradual Timing:  Intermittent Progression:  Waxing and waning Chronicity:  New Context: physical stress   Relieved by:  Nothing Worsened by:  Palpation and ambulation Ineffective treatments:  Ibuprofen, NSAIDs, OTC medications and muscle relaxants Associated symptoms: paresthesias   Associated symptoms: no abdominal pain, no abdominal swelling, no bladder incontinence, no bowel incontinence, no chest pain, no dysuria, no fever, no headaches, no leg pain, no numbness, no pelvic pain, no perianal numbness, no tingling, no weakness and no weight loss   Risk factors: not pregnant        Past Medical History:  Diagnosis Date  . Allergy   . Anxiety   . Depression   . Diabetes mellitus without complication ([place])   . Migraine headache   . Nephrolithiasis     Patient Active Problem List   Diagnosis Date Noted  . Pre-eclampsia 02/27/2018  . S/P cesarean section 02/26/2018  . Preeclampsia 02/22/2018  . Allergic rhinitis 02/05/2018  . Diabetes mellitus type II, uncontrolled ([place]) 03/20/2017  . Anxiety disorder 10/20/2015  . 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 BMI 38.0-38.9,adult 10/20/2015    Past Surgical History:  Procedure Laterality Date  . CESAREAN SECTION MULTI-GESTATIONAL N/A 02/26/2018   Procedure: CESAREAN SECTION MULTI-GESTATIONAL;  Surgeon: Linda Hedges, DO;  Location: [facility];  Service: Obstetrics;  Laterality: N/A;     OB History    Gravida  1   Para  1   Term  0   Preterm  1   AB  0   Living  2     SAB  0   TAB  0   Ectopic  0   Multiple  0   Live  Births  2           Family History  Problem Relation Age of Onset  . Mental illness Mother     Social History   Tobacco Use  . Smoking status: Never Smoker  . Smokeless tobacco: Never Used  Substance Use Topics  . Alcohol use: No  . Drug use: No    Home Medications Prior to Admission medications   Medication Sig Start Date End Date Taking? Authorizing Provider  cromolyn (NASALCROM) 5.2 MG/ACT nasal spray Place 1 spray into both nostrils 3 (three) times daily as needed for allergies or rhinitis. 02/05/18   Martin[place], Betty G, MD  ferrous sulfate 325 (65 FE) MG tablet Take 325 mg by mouth daily with breakfast.    [provider]  ibuprofen (ADVIL,MOTRIN) 600 MG tablet Take 1 tablet (600 mg total) by mouth every 6 (six) hours as needed for mild pain. 03/02/18   Marylynn Pearson, MD  labetalol (NORMODYNE) 200 MG tablet Take 4 tablets (800 mg total) by mouth 3 (three) times daily. 03/02/18   Marylynn Pearson, MD  metFORMIN (GLUCOPHAGE) 500 MG tablet TAKE 1 TABLET BY MOUTH 2 (TWO) TIMES DAILY WITH A MEAL. 11/02/18   Martinique, Betty G, MD  methylPREDNISolone (MEDROL DOSEPAK) 4 MG TBPK tablet Follows package insert 06/02/19   Kabao Leite, DO  NIFEdipine (ADALAT CC) 30 MG 24 hr  tablet Take 1 tablet (30 mg total) by mouth daily. 03/03/18   Zelphia Cairo, MD  oxyCODONE-acetaminophen (PERCOCET/ROXICET) 5-325 MG tablet Take 1-2 tablets by mouth every 4 (four) hours as needed for moderate pain. 03/02/18   Zelphia Cairo, MD  Prenatal Vit-Fe Fumarate-FA (PRENATAL VITAMIN PO) Take by mouth daily.    [provider]    Allergies    Patient has no known allergies.  Review of Systems   Review of Systems  Constitutional: Negative for chills, fever and weight loss.  HENT: Negative for ear pain and sore throat.   Eyes: Negative for pain and visual disturbance.  Respiratory: Negative for cough and shortness of breath.   Cardiovascular: Negative for chest pain and palpitations.    Gastrointestinal: Negative for abdominal pain, bowel incontinence and vomiting.  Genitourinary: Negative for bladder incontinence, dysuria, hematuria and pelvic pain.  Musculoskeletal: Positive for back pain and gait problem. Negative for arthralgias.  Skin: Negative for color change and rash.  Neurological: Positive for paresthesias. Negative for tingling, seizures, syncope, weakness, numbness and headaches.  All other systems reviewed and are negative.   Physical Exam Updated Vital Signs BP 123/75 (BP Location: Right Arm)   Pulse 84   Temp 98.6 F (37 C) (Oral)   Resp 16   Ht 5\' 8"  (1.727 m)   Wt 108.9 kg   LMP 05/28/2019   SpO2 100%   Breastfeeding No   BMI 36.49 kg/m   Physical Exam Vitals and nursing note reviewed.  Constitutional:      General: She is not in acute distress.    Appearance: She is well-developed.  HENT:     Head: Normocephalic and atraumatic.     Nose: Nose normal.     Mouth/Throat:     Mouth: Mucous membranes are moist.  Eyes:     Extraocular Movements: Extraocular movements intact.     Conjunctiva/sclera: Conjunctivae normal.     Pupils: Pupils are equal, round, and reactive to light.  Cardiovascular:     Rate and Rhythm: Normal rate and regular rhythm.     Heart sounds: No murmur.  Pulmonary:     Effort: Pulmonary effort is normal. No respiratory distress.     Breath sounds: Normal breath sounds.  Abdominal:     General: There is no distension.     Palpations: Abdomen is soft.     Tenderness: There is no abdominal tenderness.  Musculoskeletal:        General: Tenderness present.     Cervical back: Neck supple.     Comments: Tenderness to left paraspinal muscles, tenderness to left gluteal muscles  Skin:    General: Skin is warm and dry.  Neurological:     General: No focal deficit present.     Mental Status: She is alert and oriented to person, place, and time.     Cranial Nerves: No cranial nerve deficit.     Sensory: No sensory  deficit.     Motor: No weakness.     Coordination: Coordination normal.     Comments: 5+ out of 5 strength throughout, normal sensation     ED Results / Procedures / Treatments   Labs (all labs ordered are listed, but only abnormal results are displayed) Labs Reviewed - No data to display  EKG None  Radiology No results found.  Procedures Procedures (including critical care time)  Medications Ordered in ED Medications  ketorolac (TORADOL) injection 30 mg (has no administration in time range)  HYDROcodone-acetaminophen (NORCO/VICODIN)  5-325 MG per tablet 1 tablet (has no administration in time range)    ED Course  I have reviewed the triage vital signs and the nursing notes.  Pertinent labs & imaging results that were available during my care of the patient were reviewed by me and considered in my medical decision making (see chart for details).    MDM Rules/Calculators/A&P                      Allanna Bresee is a 29 year old female history of diabetes who presents to the ED with low back pain.  Normal vitals.  No fever.  No symptoms to suggest cauda equina.  Normal neurological exam.  Tenderness to the paraspinal muscle lumbar muscles on the left and left gluteal muscles.  Likely sciatic type nerve pain.  Has tried over-the-counter medication, steroids, muscle relaxant with minimal improvement.  Has not been able to follow-up with her primary care doctor.  No history of trauma.  Menstrual cycle was last week.  No concern for pregnancy.  Patient given Toradol and Norco in the ED for pain relief.  Recommend scheduling Motrin which she has not done.  Will give Medrol Dosepak and have her follow-up with primary care doctor.  Discharged in good condition.  Given return precautions.  This chart was dictated using voice recognition software.  Despite best efforts to proofread,  errors can occur which can change the documentation meaning.    Final Clinical Impression(s) /  ED Diagnoses Final diagnoses:  Acute left-sided low back pain with sciatica, sciatica laterality unspecified    Rx / DC Orders ED Discharge Orders         Ordered    methylPREDNISolone (MEDROL DOSEPAK) 4 MG TBPK tablet     06/02/19 2205           Virgina Norfolk, DO 06/02/19 2206

## 2019-07-23 LAB — HM DIABETES EYE EXAM

## 2020-01-30 ENCOUNTER — Ambulatory Visit: Payer: Managed Care, Other (non HMO) | Admitting: Family Medicine

## 2020-02-17 ENCOUNTER — Telehealth: Payer: Self-pay

## 2020-02-17 NOTE — Telephone Encounter (Signed)
I left a message asking the pt to call back regarding her new patient appointment that she had scheduled through mychart. If the patient called back please notify her of our office policy for new patient appointments. Verify her insurance and screen her for covid.

## 2020-02-28 ENCOUNTER — Telehealth: Payer: Managed Care, Other (non HMO) | Admitting: Nurse Practitioner

## 2020-02-28 DIAGNOSIS — M545 Low back pain, unspecified: Secondary | ICD-10-CM | POA: Diagnosis not present

## 2020-02-28 MED ORDER — PREDNISONE 10 MG (21) PO TBPK: ORAL_TABLET | ORAL | 0 refills | Status: DC

## 2020-02-28 MED ORDER — CYCLOBENZAPRINE HCL 10 MG PO TABS: 10.0000 mg | ORAL_TABLET | Freq: Three times a day (TID) | ORAL | 0 refills | Status: DC | PRN

## 2020-02-28 NOTE — Progress Notes (Signed)
Virtual Visit Progress Note  Angela Powers,you are scheduled for a virtual visit with your provider today.    Just as we do with appointments in the office, we must obtain your consent to participate.  Your consent will be active for this visit and any virtual visit you may have with one of our providers in the next 365 days.    If you have a MyChart account, I can also send a copy of this consent to you electronically.  All virtual visits are billed to your insurance company just like a traditional visit in the office.  As this is a virtual visit, video technology does not allow for your provider to perform a traditional examination.  This may limit your provider's ability to fully assess your condition.  If your provider identifies any concerns that need to be evaluated in person or the need to arrange testing such as labs, EKG, etc, we will make arrangements to do so.    Although advances in technology are sophisticated, we cannot ensure that it will always work on either your end or our end.  If the connection with a video visit is poor, we may have to switch to a telephone visit.  With either a video or telephone visit, we are not always able to ensure that we have a secure connection.   I need to obtain your verbal consent now.   Are you willing to proceed with your visit today?   Angela Powers has provided verbal consent on 02/28/2020 for a virtual visit (video or telephone).   Angela Dooms, NP 02/28/2020  6:43 PM    I connected with Angela Powers on 02/28/20 at  6:45 PM EST by video enabled telemedicine visit and verified that I am speaking with the correct person using two identifiers.   I discussed the limitations, risks, security and privacy concerns of performing an evaluation and management service by telemedicine and the availability of in-person appointments. I also discussed with the patient that there may be a patient responsible charge related to this  service. The patient expressed understanding and agreed to proceed.   Other persons participating in the visit and their role in the encounter: none  Patient's location: home Provider's location: home  Chief Complaint: back pain    Patient Care Team: Angela Powers, Angela G, MD as PCP - General (Family Medicine)   Name of the patient: Angela Powers  [id number]  03/08/1990   Date of visit: 02/28/20  History of Presenting Illness-  Angela Powers is a 30 y.o. female who presents for evaluation of low back pain. The patient has had recurrent self limited episodes of low back pain in the past. Symptoms have been present for 1 day and are gradually worsening.  Onset was related to / precipitated by no known injury. The pain is located in the right, left sacroiliac area and radiates to the buttock. The pain is described as stiffness and occurs all day. She rates her pain as a 6 on a scale of 0-10. Symptoms are exacerbated by standing. Symptoms are improved by rest. She has also tried change in body position, heat, ice, NSAIDs and rest which provided no symptom relief. She has no other symptoms associated with the back pain. The patient has no "red flag" history indicative of complicated back pain.  Review of systems- Review of Systems  Constitutional: Positive for malaise/fatigue. Negative for chills, diaphoresis, fever and weight loss.  HENT: Negative for congestion, ear discharge,  ear pain, nosebleeds, sinus pain and sore throat.   Eyes: Negative for pain and redness.  Respiratory: Negative for cough, hemoptysis, sputum production, shortness of breath, wheezing and stridor.   Cardiovascular: Negative for chest pain, palpitations and leg swelling.  Gastrointestinal: Negative for abdominal pain, constipation, diarrhea, nausea and vomiting.  Musculoskeletal: Positive for back pain. Negative for falls, joint pain, myalgias and neck pain.  Skin: Negative for rash.  Neurological: Negative for  dizziness, loss of consciousness, weakness and headaches.  Psychiatric/Behavioral: Negative for depression. The patient is not nervous/anxious and does not have insomnia.   All other systems reviewed and are negative.    No Known Allergies  Past Medical History:  Diagnosis Date  . Allergy   . Anxiety   . Depression   . Diabetes mellitus without complication (H[facility])   . Migraine headache   . Nephrolithiasis     Past Surgical History:  Procedure Laterality Date  . CESAREAN SECTION MULTI-GESTATIONAL N/A 02/26/2018   Procedure: CESAREAN SECTION MULTI-GESTATIONAL;  Surgeon: Angela Honour, DO;  Location: [facility];  Service: Obstetrics;  Laterality: N/A;    Social History   Socioeconomic History  . Marital status: Married    Spouse name: Not on file  . Number of children: Not on file  . Years of education: Not on file  . Highest education level: Not on file  Occupational History  . Not on file  Tobacco Use  . Smoking status: Never Smoker  . Smokeless tobacco: Never Used  Substance and Sexual Activity  . Alcohol use: No  . Drug use: No  . Sexual activity: Not Currently    Birth control/protection: None  Other Topics Concern  . Not on file  Social History Narrative  . Not on file   Social Determinants of Health   Financial Resource Strain: Not on file  Food Insecurity: Not on file  Transportation Needs: Not on file  Physical Activity: Not on file  Stress: Not on file  Social Connections: Not on file  Intimate Partner Violence: Not on file    There is no immunization history for the selected administration types on file for this patient.  Family History  Problem Relation Age of Onset  . Mental illness Mother      Current Outpatient Medications:  .  cromolyn (NASALCROM) 5.2 MG/ACT nasal spray, Place 1 spray into both nostrils 3 (three) times daily as needed for allergies or rhinitis., Disp: 26 mL, Rfl: 1 .  ferrous sulfate 325 (65 FE) MG tablet, Take 325  mg by mouth daily with breakfast., Disp: , Rfl:  .  ibuprofen (ADVIL,MOTRIN) 600 MG tablet, Take 1 tablet (600 mg total) by mouth every 6 (six) hours as needed for mild pain., Disp: 30 tablet, Rfl: 0 .  labetalol (NORMODYNE) 200 MG tablet, Take 4 tablets (800 mg total) by mouth 3 (three) times daily., Disp: 30 tablet, Rfl: 2 .  metFORMIN (GLUCOPHAGE) 500 MG tablet, TAKE 1 TABLET BY MOUTH 2 (TWO) TIMES DAILY WITH A MEAL., Disp: 180 tablet, Rfl: 1 .  methylPREDNISolone (MEDROL DOSEPAK) 4 MG TBPK tablet, Follows package insert, Disp: 21 each, Rfl: 0 .  NIFEdipine (ADALAT CC) 30 MG 24 hr tablet, Take 1 tablet (30 mg total) by mouth daily., Disp: 30 tablet, Rfl: 2 .  oxyCODONE-acetaminophen (PERCOCET/ROXICET) 5-325 MG tablet, Take 1-2 tablets by mouth every 4 (four) hours as needed for moderate pain., Disp: 30 tablet, Rfl: 0 .  Prenatal Vit-Fe Fumarate-FA (PRENATAL VITAMIN PO), Take by mouth  daily., Disp: , Rfl:   Physical exam: Exam limited due to telemedicine Physical Exam Constitutional:      General: She is not in acute distress. HENT:     Head: Normocephalic.  Pulmonary:     Effort: No respiratory distress.  Musculoskeletal:        General: No deformity.  Neurological:     Mental Status: She is alert and oriented to person, place, and time.  Psychiatric:        Mood and Affect: Mood normal.        Behavior: Behavior normal.       Assessment and plan- Patient is a 30 y.o. female who presents for complaints of nonspecific low back pain.   Prednisone as ordered.  OTC analgesics as needed. Muscle relaxants per medication orders. Follow up with PCP for ongoing management or unrelieved symptoms.     Visit Diagnosis 1. Nonspecific low back pain    Patient expressed understanding and was in agreement with this plan. She also understands that She can call clinic at any time with any questions, concerns, or complaints.   I discussed the assessment and treatment plan with the patient.  The patient was provided an opportunity to ask questions and all were answered. The patient agreed with the plan and demonstrated an understanding of the instructions.   The patient was advised to call back or seek an in-person evaluation if the symptoms worsen or if the condition fails to improve as anticipated.   I spent 15 minutes face-to-face video visit time dedicated to the care of this patient on the date of this encounter to include pre-visit review of past medical notes related to back pain & previous evaluation, face-to-face time with the patient, and post visit ordering of testing/documentation.   Thank you for allowing me to participate in the care of this very pleasant patient.   Consuello Masse, DNP, AGNP-C [place]

## 2020-03-16 ENCOUNTER — Ambulatory Visit: Payer: Managed Care, Other (non HMO) | Admitting: Family Medicine

## 2020-03-23 ENCOUNTER — Encounter: Payer: Self-pay | Admitting: Nurse Practitioner

## 2020-04-07 ENCOUNTER — Encounter: Payer: Self-pay | Admitting: Nurse Practitioner

## 2020-04-07 ENCOUNTER — Other Ambulatory Visit: Payer: Self-pay

## 2020-04-07 ENCOUNTER — Ambulatory Visit: Payer: Managed Care, Other (non HMO) | Admitting: Nurse Practitioner

## 2020-04-07 VITALS — BP 120/68 | HR 66 | Temp 97.8°F | Ht 68.0 in | Wt 255.0 lb

## 2020-04-07 DIAGNOSIS — E1129 Type 2 diabetes mellitus with other diabetic kidney complication: Secondary | ICD-10-CM

## 2020-04-07 DIAGNOSIS — F418 Other specified anxiety disorders: Secondary | ICD-10-CM | POA: Diagnosis not present

## 2020-04-07 DIAGNOSIS — E1165 Type 2 diabetes mellitus with hyperglycemia: Secondary | ICD-10-CM

## 2020-04-07 DIAGNOSIS — Z7689 Persons encountering health services in other specified circumstances: Secondary | ICD-10-CM | POA: Diagnosis not present

## 2020-04-07 DIAGNOSIS — Z8759 Personal history of other complications of pregnancy, childbirth and the puerperium: Secondary | ICD-10-CM

## 2020-04-07 DIAGNOSIS — R809 Proteinuria, unspecified: Secondary | ICD-10-CM

## 2020-04-07 LAB — POCT UA - MICROALBUMIN
Creatinine, POC: 10 mg/dL
Microalbumin Ur, POC: 30 mg/L

## 2020-04-07 MED ORDER — METFORMIN HCL 500 MG PO TABS: 500.0000 mg | ORAL_TABLET | Freq: Two times a day (BID) | ORAL | 3 refills | Status: DC

## 2020-04-07 MED ORDER — CITALOPRAM HYDROBROMIDE 20 MG PO TABS: 20.0000 mg | ORAL_TABLET | Freq: Every day | ORAL | 3 refills | Status: DC

## 2020-04-07 NOTE — Progress Notes (Signed)
New Patient Office Visit  Subjective:  Patient ID: Angela Powers, female    DOB: 10/07/1990  Age: 30 y.o. MRN: [id number]  CC:  Chief Complaint  Patient presents with  . Type 2 DM    New to establish and get back on medications    HPI Angela Powers is a 30 year old Caucasian female that presents to establish care with a primary care provider. She has a past medical history of type 2 diabetes mellitus, anxiety with depression, and gestational hypertension. She is a first grade teacher in [place] and the mother of two-year-old twin girls. She has not had medication to treat diabetes for approximately 63-months and anxiety/depression for two years. She is currently not using any method of contraception. States she is currently having menses. She tells me her spouse is going to have a vasectomy in the near future.   Type 2 Diabetes Mellitus Pt has a past medical history of type 2 DM since 2019. She is not currently taking medication. Prior treatment was Metformin 500 mg BID. Denies hypoglycemic episodes. She does check blood sugars intermittently. States her blood sugar this am was 149. She is trying to consume a healthy low-carb diet. She admits to having time constraints in the morning and skipping meals. She tells me she has been packing her lunches for work that are healthy. She is not exercising regularly currently.   Anxiety with depression Pt has a past history of anxiety and depression that was diagnosed 2017. She is not currently taking medications or attending counseling. She discontinued Citalopram when she became pregnant with twins 3 years ago. GAD-7  And PHQ-9 scores in office today were both 15. She tells me that she feels anxiety is worse than depression currently. She has agreed to resume Citalopram 20 mg daily to treat symptoms.    Past Medical History:  Diagnosis Date  . Allergy   . Anxiety   . Depression   . Diabetes mellitus without  complication ([facility])   . Migraine headache   . Nephrolithiasis     Past Surgical History:  Procedure Laterality Date  . CESAREAN SECTION MULTI-GESTATIONAL N/A 02/26/2018   Procedure: CESAREAN SECTION MULTI-GESTATIONAL;  Surgeon: Mitchel Honour, DO;  Location: [facility];  Service: Obstetrics;  Laterality: N/A;    Family History  Problem Relation Age of Onset  . Mental illness Mother     Social History   Socioeconomic History  . Marital status: Married    Spouse name: Not on file  . Number of children: Not on file  . Years of education: Not on file  . Highest education level: Not on file  Occupational History  . Not on file  Tobacco Use  . Smoking status: Never Smoker  . Smokeless tobacco: Never Used  Substance and Sexual Activity  . Alcohol use: No  . Drug use: No  . Sexual activity: Not Currently    Birth control/protection: None  Other Topics Concern  . Not on file  Social History Narrative  . Not on file      ROS Review of Systems  Constitutional: Positive for fatigue. Negative for appetite change.  HENT: Negative.   Eyes: Positive for visual disturbance (wears eyeglasses).  Respiratory: Negative for cough and shortness of breath.   Cardiovascular: Negative for chest pain and palpitations.  Gastrointestinal: Negative for abdominal pain, constipation, diarrhea and nausea.  Endocrine: Negative for cold intolerance, heat intolerance, polydipsia, polyphagia and polyuria.  Genitourinary: Negative for dyspareunia, dysuria, menstrual problem  and pelvic pain.  Musculoskeletal: Negative for arthralgias, back pain and gait problem.  Skin: Negative.   Allergic/Immunologic: Positive for environmental allergies.  Neurological: Positive for headaches (history of migraines).  Hematological: Negative for adenopathy.  Psychiatric/Behavioral: Positive for decreased concentration, dysphoric mood and sleep disturbance. Negative for self-injury and suicidal ideas. The  patient is nervous/anxious. The patient is not hyperactive.     Objective:   Today's Vitals: BP 120/68 (BP Location: Left Arm, Patient Position: Sitting)   Pulse 66   Temp 97.8 F (36.6 C) (Temporal)   Ht 5\' 8"  (1.727 m)   Wt 255 lb (115.7 kg)   SpO2 99%   BMI 38.77 kg/m   Physical Exam Vitals reviewed.  Constitutional:      Appearance: Normal appearance.  HENT:     Head: Normocephalic.  Eyes:     Comments: Eye glasses in place  Pulmonary:     Effort: Pulmonary effort is normal.  Musculoskeletal:        General: Normal range of motion.  Skin:    General: Skin is warm and dry.     Capillary Refill: Capillary refill takes less than 2 seconds.  Neurological:     General: No focal deficit present.     Mental Status: She is alert and oriented to person, place, and time.  Psychiatric:        Mood and Affect: Mood normal.        Behavior: Behavior normal.     Assessment & Plan:   1. Type 2 diabetes mellitus with hyperglycemia, without long-term current use of insulin (HCC) - CBC with Differential/Platelet - Comprehensive metabolic panel - TSH - Hemoglobin A1c - metFORMIN (GLUCOPHAGE) 500 MG tablet; Take 1 tablet (500 mg total) by mouth 2 (two) times daily with a meal.  Dispense: 180 tablet; Refill: 3 - POCT UA - Microalbumin  2. History of gestational hypertension  3. Anxiety with depression - citalopram (CELEXA) 20 MG tablet; Take 1 tablet (20 mg total) by mouth daily.  Dispense: 30 tablet; Refill: 3 -GAD-7 score 15 PHQ-9 score 15   4. Encounter to establish care with new doctor  5. Type 2 diabetes mellitus with microalbuminuria, without long-term current use of insulin (HCC) -Microalbumin 30 today in office   Outpatient Encounter Medications as of 04/07/2020  Medication Sig  . [DISCONTINUED] cromolyn (NASALCROM) 5.2 MG/ACT nasal spray Place 1 spray into both nostrils 3 (three) times daily as needed for allergies or rhinitis.  . [DISCONTINUED] cyclobenzaprine  (FLEXERIL) 10 MG tablet Take 1 tablet (10 mg total) by mouth 3 (three) times daily as needed for muscle spasms.  . [DISCONTINUED] ferrous sulfate 325 (65 FE) MG tablet Take 325 mg by mouth daily with breakfast.  . [DISCONTINUED] ibuprofen (ADVIL,MOTRIN) 600 MG tablet Take 1 tablet (600 mg total) by mouth every 6 (six) hours as needed for mild pain.  . [DISCONTINUED] labetalol (NORMODYNE) 200 MG tablet Take 4 tablets (800 mg total) by mouth 3 (three) times daily.  . [DISCONTINUED] metFORMIN (GLUCOPHAGE) 500 MG tablet TAKE 1 TABLET BY MOUTH 2 (TWO) TIMES DAILY WITH A MEAL.  . [DISCONTINUED] NIFEdipine (ADALAT CC) 30 MG 24 hr tablet Take 1 tablet (30 mg total) by mouth daily.  . [DISCONTINUED] oxyCODONE-acetaminophen (PERCOCET/ROXICET) 5-325 MG tablet Take 1-2 tablets by mouth every 4 (four) hours as needed for moderate pain.  . [DISCONTINUED] predniSONE (STERAPRED UNI-PAK 21 TAB) 10 MG (21) TBPK tablet Day 1: 2 tab before breakfast, 1 after lunch, 1 after dinner, &  2 at bedtime Day 2: 1 tab before breakfast, 1 after lunch, 1 after dinner, & 2 at bedtime Day 3: 1 tab before breakfast, 1 after lunch, 1 after dinner, & 1 at bedtime Day 4: 1 tab before breakfast, 1 after lunch, & 1 at bedtime Day 5: 1 tab before breakfast & 1 at bedtime Day 6: 1 tablet before breakfast   No facility-administered encounter medications on file as of 04/07/2020.   Resume Metformin 500 mg twice daily Consume heart healthy diet Increase physical activity Resume Citalopram 20 mg daily Notify office of any side effects of medications We will call you with lab results Return in 4-weeks for follow-up    Follow-up: 4-weeks   Janie Morning, NP

## 2020-04-07 NOTE — Patient Instructions (Addendum)
Resume Metformin 500 mg twice daily Consume heart healthy diet Increase physical activity Resume Citalopram 20 mg daily Notify office of any side effects of medications We will call you with lab results Return in 4-weeks for follow-up  Health Maintenance, Female Adopting a healthy lifestyle and getting preventive care are important in promoting health and wellness. Ask your health care provider about:  The right schedule for you to have regular tests and exams.  Things you can do on your own to prevent diseases and keep yourself healthy. What should I know about diet, weight, and exercise? Eat a healthy diet  Eat a diet that includes plenty of vegetables, fruits, low-fat dairy products, and lean protein.  Do not eat a lot of foods that are high in solid fats, added sugars, or sodium.   Maintain a healthy weight Body mass index (BMI) is used to identify weight problems. It estimates body fat based on height and weight. Your health care provider can help determine your BMI and help you achieve or maintain a healthy weight. Get regular exercise Get regular exercise. This is one of the most important things you can do for your health. Most adults should:  Exercise for at least 150 minutes each week. The exercise should increase your heart rate and make you sweat (moderate-intensity exercise).  Do strengthening exercises at least twice a week. This is in addition to the moderate-intensity exercise.  Spend less time sitting. Even light physical activity can be beneficial. Watch cholesterol and blood lipids Have your blood tested for lipids and cholesterol at 30 years of age, then have this test every 5 years. Have your cholesterol levels checked more often if:  Your lipid or cholesterol levels are high.  You are older than 30 years of age.  You are at high risk for heart disease. What should I know about cancer screening? Depending on your health history and family history, you may  need to have cancer screening at various ages. This may include screening for:  Breast cancer.  Cervical cancer.  Colorectal cancer.  Skin cancer.  Lung cancer. What should I know about heart disease, diabetes, and high blood pressure? Blood pressure and heart disease  High blood pressure causes heart disease and increases the risk of stroke. This is more likely to develop in people who have high blood pressure readings, are of African descent, or are overweight.  Have your blood pressure checked: ? Every 3-5 years if you are 79-19 years of age. ? Every year if you are 36 years old or older. Diabetes Have regular diabetes screenings. This checks your fasting blood sugar level. Have the screening done:  Once every three years after age 18 if you are at a normal weight and have a low risk for diabetes.  More often and at a younger age if you are overweight or have a high risk for diabetes. What should I know about preventing infection? Hepatitis B If you have a higher risk for hepatitis B, you should be screened for this virus. Talk with your health care provider to find out if you are at risk for hepatitis B infection. Hepatitis C Testing is recommended for:  Everyone born from [birth year] through 1965.  Anyone with known risk factors for hepatitis C. Sexually transmitted infections (STIs)  Get screened for STIs, including gonorrhea and chlamydia, if: ? You are sexually active and are younger than 30 years of age. ? You are older than 30 years of age and your health care  provider tells you that you are at risk for this type of infection. ? Your sexual activity has changed since you were last screened, and you are at increased risk for chlamydia or gonorrhea. Ask your health care provider if you are at risk.  Ask your health care provider about whether you are at high risk for HIV. Your health care provider may recommend a prescription medicine to help prevent HIV infection. If you  choose to take medicine to prevent HIV, you should first get tested for HIV. You should then be tested every 3 months for as long as you are taking the medicine. Pregnancy  If you are about to stop having your period (premenopausal) and you may become pregnant, seek counseling before you get pregnant.  Take 400 to 800 micrograms (mcg) of folic acid every day if you become pregnant.  Ask for birth control (contraception) if you want to prevent pregnancy. Osteoporosis and menopause Osteoporosis is a disease in which the bones lose minerals and strength with aging. This can result in bone fractures. If you are 60 years old or older, or if you are at risk for osteoporosis and fractures, ask your health care provider if you should:  Be screened for bone loss.  Take a calcium or vitamin D supplement to lower your risk of fractures.  Be given hormone replacement therapy (HRT) to treat symptoms of menopause. Follow these instructions at home: Lifestyle  Do not use any products that contain nicotine or tobacco, such as cigarettes, e-cigarettes, and chewing tobacco. If you need help quitting, ask your health care provider.  Do not use street drugs.  Do not share needles.  Ask your health care provider for help if you need support or information about quitting drugs. Alcohol use  Do not drink alcohol if: ? Your health care provider tells you not to drink. ? You are pregnant, may be pregnant, or are planning to become pregnant.  If you drink alcohol: ? Limit how much you use to 0-1 drink a day. ? Limit intake if you are breastfeeding.  Be aware of how much alcohol is in your drink. In the [place] one drink equals one 12 oz bottle of beer (355 mL), one 5 oz glass of wine (148 mL), or one 1 oz glass of hard liquor (44 mL). General instructions  Schedule regular health, dental, and eye exams.  Stay current with your vaccines.  Tell your health care provider if: ? You often feel  depressed. ? You have ever been abused or do not feel safe at home. Summary  Adopting a healthy lifestyle and getting preventive care are important in promoting health and wellness.  Follow your health care provider's instructions about healthy diet, exercising, and getting tested or screened for diseases.  Follow your health care provider's instructions on monitoring your cholesterol and blood pressure. This information is not intended to replace advice given to you by your health care provider. Make sure you discuss any questions you have with your health care provider. Document Revised: 12/20/2017 Document Reviewed: 12/20/2017 Elsevier Patient Education  2021 [place] 72-56 Years Old, Female Preventive care refers to lifestyle choices and visits with your health care provider that can promote health and wellness. This includes:  A yearly physical exam. This is also called an annual wellness visit.  Regular dental and eye exams.  Immunizations.  Screening for certain conditions.  Healthy lifestyle choices, such as: ? Eating a healthy diet. ? Getting regular exercise. ?  Not using drugs or products that contain nicotine and tobacco. ? Limiting alcohol use. What can I expect for my preventive care visit? Physical exam Your health care provider may check your:  Height and weight. These may be used to calculate your BMI (body mass index). BMI is a measurement that tells if you are at a healthy weight.  Heart rate and blood pressure.  Body temperature.  Skin for abnormal spots. Counseling Your health care provider may ask you questions about your:  Past medical problems.  Family's medical history.  Alcohol, tobacco, and drug use.  Emotional well-being.  Home life and relationship well-being.  Sexual activity.  Diet, exercise, and sleep habits.  Work and work Statistician.  Access to firearms.  Method of birth control.  Menstrual  cycle.  Pregnancy history. What immunizations do I need? Vaccines are usually given at various ages, according to a schedule. Your health care provider will recommend vaccines for you based on your age, medical history, and lifestyle or other factors, such as travel or where you work.   What tests do I need? Blood tests  Lipid and cholesterol levels. These may be checked every 5 years starting at age 20.  Hepatitis C test.  Hepatitis B test. Screening  Diabetes screening. This is done by checking your blood sugar (glucose) after you have not eaten for a while (fasting).  STD (sexually transmitted disease) testing, if you are at risk.  BRCA-related cancer screening. This may be done if you have a family history of breast, ovarian, tubal, or peritoneal cancers.  Pelvic exam and Pap test. This may be done every 3 years starting at age 57. Starting at age 5, this may be done every 5 years if you have a Pap test in combination with an HPV test. Talk with your health care provider about your test results, treatment options, and if necessary, the need for more tests.   Follow these instructions at home: Eating and drinking  Eat a healthy diet that includes fresh fruits and vegetables, whole grains, lean protein, and low-fat dairy products.  Take vitamin and mineral supplements as recommended by your health care provider.  Do not drink alcohol if: ? Your health care provider tells you not to drink. ? You are pregnant, may be pregnant, or are planning to become pregnant.  If you drink alcohol: ? Limit how much you have to 0-1 drink a day. ? Be aware of how much alcohol is in your drink. In the [place] one drink equals one 12 oz bottle of beer (355 mL), one 5 oz glass of wine (148 mL), or one 1 oz glass of hard liquor (44 mL).   Lifestyle  Take daily care of your teeth and gums. Brush your teeth every morning and night with fluoride toothpaste. Floss one time each day.  Stay active.  Exercise for at least 30 minutes 5 or more days each week.  Do not use any products that contain nicotine or tobacco, such as cigarettes, e-cigarettes, and chewing tobacco. If you need help quitting, ask your health care provider.  Do not use drugs.  If you are sexually active, practice safe sex. Use a condom or other form of protection to prevent STIs (sexually transmitted infections).  If you do not wish to become pregnant, use a form of birth control. If you plan to become pregnant, see your health care provider for a prepregnancy visit.  Find healthy ways to cope with stress, such as: ? Meditation, yoga,  or listening to music. ? Journaling. ? Talking to a trusted person. ? Spending time with friends and family. Safety  Always wear your seat belt while driving or riding in a vehicle.  Do not drive: ? If you have been drinking alcohol. Do not ride with someone who has been drinking. ? When you are tired or distracted. ? While texting.  Wear a helmet and other protective equipment during sports activities.  If you have firearms in your house, make sure you follow all gun safety procedures.  Seek help if you have been physically or sexually abused. What's next?  Go to your health care provider once a year for an annual wellness visit.  Ask your health care provider how often you should have your eyes and teeth checked.  Stay up to date on all vaccines. This information is not intended to replace advice given to you by your health care provider. Make sure you discuss any questions you have with your health care provider. Document Revised: 08/25/2019 Document Reviewed: 09/07/2017 Elsevier Patient Education  2021 [employer].

## 2020-04-08 LAB — COMPREHENSIVE METABOLIC PANEL
ALT: 20 IU/L (ref 0–32)
AST: 26 IU/L (ref 0–40)
Albumin/Globulin Ratio: 1.5 (ref 1.2–2.2)
Albumin: 4.4 g/dL (ref 3.9–5.0)
Alkaline Phosphatase: 101 IU/L (ref 44–121)
BUN/Creatinine Ratio: 16 (ref 9–23)
BUN: 9 mg/dL (ref 6–20)
Bilirubin Total: 0.2 mg/dL (ref 0.0–1.2)
CO2: 22 mmol/L (ref 20–29)
Calcium: 9.8 mg/dL (ref 8.7–10.2)
Chloride: 95 mmol/L — ABNORMAL LOW (ref 96–106)
Creatinine, Ser: 0.56 mg/dL — ABNORMAL LOW (ref 0.57–1.00)
Globulin, Total: 2.9 g/dL (ref 1.5–4.5)
Glucose: 116 mg/dL — ABNORMAL HIGH (ref 65–99)
Potassium: 4.5 mmol/L (ref 3.5–5.2)
Sodium: 135 mmol/L (ref 134–144)
Total Protein: 7.3 g/dL (ref 6.0–8.5)
eGFR: 127 mL/min/{1.73_m2} (ref >59)

## 2020-04-08 LAB — CBC WITH DIFFERENTIAL/PLATELET
Basophils Absolute: 0 10*3/uL (ref 0.0–0.2)
Basos: 0 %
EOS (ABSOLUTE): 0.1 10*3/uL (ref 0.0–0.4)
Eos: 1 %
Hematocrit: 40.1 % (ref 34.0–46.6)
Hemoglobin: 13.5 g/dL (ref 11.1–15.9)
Immature Grans (Abs): 0 10*3/uL (ref 0.0–0.1)
Immature Granulocytes: 0 %
Lymphocytes Absolute: 3.3 10*3/uL — ABNORMAL HIGH (ref 0.7–3.1)
Lymphs: 28 %
MCH: 28 pg (ref 26.6–33.0)
MCHC: 33.7 g/dL (ref 31.5–35.7)
MCV: 83 fL (ref 79–97)
Monocytes Absolute: 0.7 10*3/uL (ref 0.1–0.9)
Monocytes: 6 %
Neutrophils Absolute: 7.8 10*3/uL — ABNORMAL HIGH (ref 1.4–7.0)
Neutrophils: 65 %
Platelets: 435 10*3/uL (ref 150–450)
RBC: 4.82 x10E6/uL (ref 3.77–5.28)
RDW: 13.4 % (ref 11.7–15.4)
WBC: 12 10*3/uL — ABNORMAL HIGH (ref 3.4–10.8)

## 2020-04-08 LAB — HEMOGLOBIN A1C
Est. average glucose Bld gHb Est-mCnc: 192 mg/dL
Hgb A1c MFr Bld: 8.3 % — ABNORMAL HIGH (ref 4.8–5.6)

## 2020-04-08 LAB — TSH: TSH: 1.65 u[IU]/mL (ref 0.450–4.500)

## 2020-04-29 ENCOUNTER — Other Ambulatory Visit: Payer: Self-pay | Admitting: Nurse Practitioner

## 2020-04-29 DIAGNOSIS — F418 Other specified anxiety disorders: Secondary | ICD-10-CM

## 2020-05-05 ENCOUNTER — Other Ambulatory Visit: Payer: Self-pay

## 2020-05-05 ENCOUNTER — Ambulatory Visit: Payer: Managed Care, Other (non HMO) | Admitting: Nurse Practitioner

## 2020-05-05 ENCOUNTER — Encounter: Payer: Self-pay | Admitting: Nurse Practitioner

## 2020-05-05 VITALS — BP 128/68 | HR 74 | Temp 97.1°F | Ht 68.0 in | Wt 254.0 lb

## 2020-05-05 DIAGNOSIS — F418 Other specified anxiety disorders: Secondary | ICD-10-CM | POA: Diagnosis not present

## 2020-05-05 DIAGNOSIS — E1165 Type 2 diabetes mellitus with hyperglycemia: Secondary | ICD-10-CM | POA: Diagnosis not present

## 2020-05-05 DIAGNOSIS — Z6838 Body mass index (BMI) 38.0-38.9, adult: Secondary | ICD-10-CM

## 2020-05-05 DIAGNOSIS — F909 Attention-deficit hyperactivity disorder, unspecified type: Secondary | ICD-10-CM

## 2020-05-05 MED ORDER — METFORMIN HCL ER (MOD) 1000 MG PO TB24: 1000.0000 mg | ORAL_TABLET | Freq: Every day | ORAL | 0 refills | Status: DC

## 2020-05-05 MED ORDER — CITALOPRAM HYDROBROMIDE 20 MG PO TABS: 20.0000 mg | ORAL_TABLET | Freq: Every day | ORAL | 0 refills | Status: DC

## 2020-05-05 MED ORDER — CITALOPRAM HYDROBROMIDE 10 MG PO TABS: 30.0000 mg | ORAL_TABLET | Freq: Every day | ORAL | 0 refills | Status: DC

## 2020-05-05 MED ORDER — LISDEXAMFETAMINE DIMESYLATE 20 MG PO CAPS: 20.0000 mg | ORAL_CAPSULE | Freq: Every day | ORAL | 0 refills | Status: DC

## 2020-05-05 NOTE — Patient Instructions (Addendum)
Increase Citalopram to 30 mg daily Continue heart healthy diet and physical activity Increase Metformin XR 1,000 mg daily Vyvanse 20 mg daily for ADHD Notify office immediately of any adverse effects of medication Follow-up in 52-months Attention Deficit Hyperactivity Disorder, Adult Attention deficit hyperactivity disorder (ADHD) is a mental health disorder that starts during childhood (neurodevelopmental disorder). For many people with ADHD, the disorder continues into the adult years. Treatment can help you manage your symptoms. What are the causes? The exact cause of ADHD is not known. Most experts believe genetics and environmental factors contribute to ADHD. What increases the risk? The following factors may make you more likely to develop this condition:  Having a family history of ADHD.  Being female.  Being born to a mother who smoked or drank alcohol during pregnancy.  Being exposed to lead or other toxins in the womb or early in life.  Being born before 37 weeks of pregnancy (prematurely) or at a low birth weight.  Having experienced a brain injury. What are the signs or symptoms? Symptoms of this condition depend on the type of ADHD. The two main types are inattentive and hyperactive-impulsive. Some people may have symptoms of both types. Symptoms of the inattentive type include:  Difficulty paying attention.  Making careless mistakes.  Not following instructions.  Being disorganized.  Avoiding tasks that require time and attention.  Losing and forgetting things.  Being easily distracted. Symptoms of the hyperactive-impulsive type include:  Restlessness.  Talking too much.  Interrupting.  Difficulty with: ? Sitting still. ? Feeling motivated. ? Relaxing. ? Waiting in line or waiting for a turn. In adults, this condition may lead to certain problems, such as:  Keeping jobs.  Performing tasks at work.  Having stable relationships.  Being on time or  keeping to a schedule. How is this diagnosed? This condition is diagnosed based on your current symptoms and your history of symptoms. The diagnosis can be made by a health care provider such as a primary care provider or a mental health care specialist. Your health care provider may use a symptom checklist or a behavior rating scale to evaluate your symptoms. He or she may also want to talk with people who have observed your behaviors throughout your life. How is this treated? This condition can be treated with medicines and behavior therapy. Medicines may be the best option to reduce impulsive behaviors and improve attention. Your health care provider may recommend:  Stimulant medicines. These are the most common medicines used for adult ADHD. They affect certain chemicals in the brain (neurotransmitters) and improve your ability to control your symptoms.  A non-stimulant medicine for adult ADHD (atomoxetine). This medicine increases a neurotransmitter called norepinephrine. It may take weeks to months to see effects from this medicine. Counseling and behavioral management are also important for treating ADHD. Counseling is often used along with medicine. Your health care provider may suggest:  Cognitive behavioral therapy (CBT). This type of therapy teaches you to replace negative thoughts and actions with positive thoughts and actions. When used as part of ADHD treatment, this therapy may also include: ? Coping strategies for organization, time management, impulse control, and stress reduction. ? Mindfulness and meditation training.  Behavioral management. You may work with a Psychologist, occupational who is specially trained to help people with ADHD manage and organize activities and function more effectively. Follow these instructions at home: Medicines  Take over-the-counter and prescription medicines only as told by your health care provider.  Talk with your  health care provider about the possible side  effects of your medicines and how to manage them.   Lifestyle  Do not use drugs.  Do not drink alcohol if: ? Your health care provider tells you not to drink. ? You are pregnant, may be pregnant, or are planning to become pregnant.  If you drink alcohol: ? Limit how much you use to:  0-1 drink a day for women.  0-2 drinks a day for men. ? Be aware of how much alcohol is in your drink. In the [place] one drink equals one 12 oz bottle of beer (355 mL), one 5 oz glass of wine (148 mL), or one 1 oz glass of hard liquor (44 mL).  Get enough sleep.  Eat a healthy diet.  Exercise regularly. Exercise can help to reduce stress and anxiety.   General instructions  Learn as much as you can about adult ADHD, and work closely with your health care providers to find the treatments that work best for you.  Follow the same schedule each day.  Use reminder devices like notes, calendars, and phone apps to stay on time and organized.  Keep all follow-up visits as told by your health care provider and therapist. This is important. Where to find more information A health care provider may be able to recommend resources that are available online or over the phone. You could start with:  Attention Deficit Disorder Association (ADDA): http://davis-dillon.net/  [employer] of Mental Health [facility]): http://www.maynard.net/ Contact a health care provider if:  Your symptoms continue to cause problems.  You have side effects from your medicine, such as: ? Repeated muscle twitches, coughing, or speech outbursts. ? Sleep problems. ? Loss of appetite. ? Dizziness. ? Unusually fast heartbeat. ? Stomach pains. ? Headaches.  You are struggling with anxiety, depression, or substance abuse. Get help right away if you:  Have a severe reaction to a medicine. If you ever feel like you may hurt yourself or others, or have thoughts about taking your own life, get help right away. You can go to the nearest emergency  department or call:  Your local emergency services (911 in the [place]  A suicide crisis helpline, such as the National Suicide Prevention Lifeline at 7248617470. This is open 24 hours a day. Summary  ADHD is a mental health disorder that starts during childhood (neurodevelopmental disorder) and often continues into the adult years.  The exact cause of ADHD is not known. Most experts believe genetics and environmental factors contribute to ADHD.  There is no cure for ADHD, but treatment with medicine, cognitive behavioral therapy, or behavioral management can help you manage your condition. This information is not intended to replace advice given to you by your health care provider. Make sure you discuss any questions you have with your health care provider. Document Revised: 05/21/2018 Document Reviewed: 05/21/2018 Elsevier Patient Education  2021 Elsevier Inc.   http://APA.org/depression-guideline"> https://clinicalkey.com"> http://point-of-care.elsevierperformancemanager.com/skills/"> http://point-of-care.elsevierperformancemanager.com">  Managing Depression, Adult Depression is a mental health condition that affects your thoughts, feelings, and actions. Being diagnosed with depression can bring you relief if you did not know why you have felt or behaved a certain way. It could also leave you feeling overwhelmed with uncertainty about your future. Preparing yourself to manage your symptoms can help you feel more positive about your future. How to manage lifestyle changes Managing stress Stress is your body's reaction to life changes and events, both good and bad. Stress can add to your feelings of depression. Learning  to manage your stress can help lessen your feelings of depression. Try some of the following approaches to reducing your stress (stress reduction techniques):  Listen to music that you enjoy and that inspires you.  Try using a meditation app or take a meditation  class.  Develop a practice that helps you connect with your spiritual self. Walk in nature, pray, or go to a place of worship.  Do some deep breathing. To do this, inhale slowly through your nose. Pause at the top of your inhale for a few seconds and then exhale slowly, letting your muscles relax.  Practice yoga to help relax and work your muscles. Choose a stress reduction technique that suits your lifestyle and personality. These techniques take time and practice to develop. Set aside 5-15 minutes a day to do them. Therapists can offer training in these techniques. Other things you can do to manage stress include:  Keeping a stress diary.  Knowing your limits and saying no when you think something is too much.  Paying attention to how you react to certain situations. You may not be able to control everything, but you can change your reaction.  Adding humor to your life by watching funny films or TV shows.  Making time for activities that you enjoy and that relax you.   Medicines Medicines, such as antidepressants, are often a part of treatment for depression.  Talk with your pharmacist or health care provider about all the medicines, supplements, and herbal products that you take, their possible side effects, and what medicines and other products are safe to take together.  Make sure to report any side effects you may have to your health care provider. Relationships Your health care provider may suggest family therapy, couples therapy, or individual therapy as part of your treatment. How to recognize changes Everyone responds differently to treatment for depression. As you recover from depression, you may start to:  Have more interest in doing activities.  Feel less hopeless.  Have more energy.  Overeat less often, or have a better appetite.  Have better mental focus. It is important to recognize if your depression is not getting better or is getting worse. The symptoms you  had in the beginning may return, such as:  Tiredness (fatigue) or low energy.  Eating too much or too little.  Sleeping too much or too little.  Feeling restless, agitated, or hopeless.  Trouble focusing or making decisions.  Unexplained physical complaints.  Feeling irritable, angry, or aggressive. If you or your family members notice these symptoms coming back, let your health care provider know right away. Follow these instructions at home: Activity  Try to get some form of exercise each day, such as walking, biking, swimming, or lifting weights.  Practice stress reduction techniques.  Engage your mind by taking a class or doing some volunteer work.   Lifestyle  Get the right amount and quality of sleep.  Cut down on using caffeine, tobacco, alcohol, and other potentially harmful substances.  Eat a healthy diet that includes plenty of vegetables, fruits, whole grains, low-fat dairy products, and lean protein. Do not eat a lot of foods that are high in solid fats, added sugars, or salt (sodium). General instructions  Take over-the-counter and prescription medicines only as told by your health care provider.  Keep all follow-up visits as told by your health care provider. This is important. Where to find support Talking to others Friends and family members can be sources of support and guidance.  Talk to trusted friends or family members about your condition. Explain your symptoms to them, and let them know that you are working with a health care provider to treat your depression. Tell friends and family members how they also can be helpful.   Finances  Find appropriate mental health providers that fit with your financial situation.  Talk with your health care provider about options to get reduced prices on your medicines. Where to find more information You can find support in your area from:  Anxiety and Depression Association of America (ADAA): www.adaa.org  Mental  Health America: www.mentalhealthamerica.net  [employer] on Mental Illness: www.nami.org Contact a health care provider if:  You stop taking your antidepressant medicines, and you have any of these symptoms: ? Nausea. ? Headache. ? Light-headedness. ? Chills and body aches. ? Not being able to sleep (insomnia).  You or your friends and family think your depression is getting worse. Get help right away if:  You have thoughts of hurting yourself or others. If you ever feel like you may hurt yourself or others, or have thoughts about taking your own life, get help right away. Go to your nearest emergency department or:  Call your local emergency services (911 in the [place]  Call a suicide crisis helpline, such as the National Suicide Prevention Lifeline at 8123960451. This is open 24 hours a day in the [place]  Text the Crisis Text Line at 5102720939 (in the [place] Summary  If you are diagnosed with depression, preparing yourself to manage your symptoms is a good way to feel positive about your future.  Work with your health care provider on a management plan that includes stress reduction techniques, medicines (if applicable), therapy, and healthy lifestyle habits.  Keep talking with your health care provider about how your treatment is working.  If you have thoughts about taking your own life, call a suicide crisis helpline or text a crisis text line. This information is not intended to replace advice given to you by your health care provider. Make sure you discuss any questions you have with your health care provider. Document Revised: 11/07/2018 Document Reviewed: 11/07/2018 Elsevier Patient Education  2021 Elsevier Inc. Managing Anxiety, Adult After being diagnosed with an anxiety disorder, you may be relieved to know why you have felt or behaved a certain way. You may also feel overwhelmed about the treatment ahead and what it will mean for your life. With care and support,  you can manage this condition and recover from it. How to manage lifestyle changes Managing stress and anxiety Stress is your body's reaction to life changes and events, both good and bad. Most stress will last just a few hours, but stress can be ongoing and can lead to more than just stress. Although stress can play a major role in anxiety, it is not the same as anxiety. Stress is usually caused by something external, such as a deadline, test, or competition. Stress normally passes after the triggering event has ended.  Anxiety is caused by something internal, such as imagining a terrible outcome or worrying that something will go wrong that will devastate you. Anxiety often does not go away even after the triggering event is over, and it can become long-term (chronic) worry. It is important to understand the differences between stress and anxiety and to manage your stress effectively so that it does not lead to an anxious response. Talk with your health care provider or a counselor to learn more about reducing anxiety  and stress. He or she may suggest tension reduction techniques, such as:  Music therapy. This can include creating or listening to music that you enjoy and that inspires you.  Mindfulness-based meditation. This involves being aware of your normal breaths while not trying to control your breathing. It can be done while sitting or walking.  Centering prayer. This involves focusing on a word, phrase, or sacred image that means something to you and brings you peace.  Deep breathing. To do this, expand your stomach and inhale slowly through your nose. Hold your breath for 3-5 seconds. Then exhale slowly, letting your stomach muscles relax.  Self-talk. This involves identifying thought patterns that lead to anxiety reactions and changing those patterns.  Muscle relaxation. This involves tensing muscles and then relaxing them. Choose a tension reduction technique that suits your lifestyle  and personality. These techniques take time and practice. Set aside 5-15 minutes a day to do them. Therapists can offer counseling and training in these techniques. The training to help with anxiety may be covered by some insurance plans. Other things you can do to manage stress and anxiety include:  Keeping a stress/anxiety diary. This can help you learn what triggers your reaction and then learn ways to manage your response.  Thinking about how you react to certain situations. You may not be able to control everything, but you can control your response.  Making time for activities that help you relax and not feeling guilty about spending your time in this way.  Visual imagery and yoga can help you stay calm and relax.   Medicines Medicines can help ease symptoms. Medicines for anxiety include:  Anti-anxiety drugs.  Antidepressants. Medicines are often used as a primary treatment for anxiety disorder. Medicines will be prescribed by a health care provider. When used together, medicines, psychotherapy, and tension reduction techniques may be the most effective treatment. Relationships Relationships can play a big part in helping you recover. Try to spend more time connecting with trusted friends and family members. Consider going to couples counseling, taking family education classes, or going to family therapy. Therapy can help you and others better understand your condition. How to recognize changes in your anxiety Everyone responds differently to treatment for anxiety. Recovery from anxiety happens when symptoms decrease and stop interfering with your daily activities at home or work. This may mean that you will start to:  Have better concentration and focus. Worry will interfere less in your daily thinking.  Sleep better.  Be less irritable.  Have more energy.  Have improved memory. It is important to recognize when your condition is getting worse. Contact your health care provider  if your symptoms interfere with home or work and you feel like your condition is not improving. Follow these instructions at home: Activity  Exercise. Most adults should do the following: ? Exercise for at least 150 minutes each week. The exercise should increase your heart rate and make you sweat (moderate-intensity exercise). ? Strengthening exercises at least twice a week.  Get the right amount and quality of sleep. Most adults need 7-9 hours of sleep each night. Lifestyle  Eat a healthy diet that includes plenty of vegetables, fruits, whole grains, low-fat dairy products, and lean protein. Do not eat a lot of foods that are high in solid fats, added sugars, or salt.  Make choices that simplify your life.  Do not use any products that contain nicotine or tobacco, such as cigarettes, e-cigarettes, and chewing tobacco. If you need  help quitting, ask your health care provider.  Avoid caffeine, alcohol, and certain over-the-counter cold medicines. These may make you feel worse. Ask your pharmacist which medicines to avoid.   General instructions  Take over-the-counter and prescription medicines only as told by your health care provider.  Keep all follow-up visits as told by your health care provider. This is important. Where to find support You can get help and support from these sources:  Self-help groups.  Online and community organizations.  A trusted spiritual leader.  Couples [employer]ing.  Family education classes.  Family therapy. Where to find more information You may find that joining a support group helps you deal with your anxiety. The following sources can help you locate counselors or support groups near you:  Mental Health America: www.mentalhealthamerica.net  Anxiety and Depression Association of [place] (ADAA): ProgramCam.dewww.adaa.org  [employer] on Mental Illness (NAMI): www.nami.org Contact a health care provider if you:  Have a hard time staying focused or  finishing daily tasks.  Spend many hours a day feeling worried about everyday life.  Become exhausted by worry.  Start to have headaches, feel tense, or have nausea.  Urinate more than normal.  Have diarrhea. Get help right away if you have:  A racing heart and shortness of breath.  Thoughts of hurting yourself or others. If you ever feel like you may hurt yourself or others, or have thoughts about taking your own life, get help right away. You can go to your nearest emergency department or call:  Your local emergency services (911 in the [place]  A suicide crisis helpline, such as the National Suicide Prevention Lifeline at 253 654 06971-(819)213-8828. This is open 24 hours a day. Summary  Taking steps to learn and use tension reduction techniques can help calm you and help prevent triggering an anxiety reaction.  When used together, medicines, psychotherapy, and tension reduction techniques may be the most effective treatment.  Family, friends, and partners can play a big part in helping you recover from an anxiety disorder. This information is not intended to replace advice given to you by your health care provider. Make sure you discuss any questions you have with your health care provider. Document Revised: 05/29/2018 Document Reviewed: 05/29/2018 Elsevier Patient Education  2021 [employer]

## 2020-05-05 NOTE — Progress Notes (Signed)
Subjective:  Patient ID: Angela Powers, female    DOB: 03/21/1990  Age: 30 y.o. MRN: [id number]  Chief Complaint  Patient presents with  . Depression    HPI Angela Powers is a 30 year old Caucasian female that presents for follow-up of anxiety and depression medication. Citalopram 20 mg daily was prescribed  04/07/20. She states symptoms have improved but are not at goal. PHQ-9 score 9 today in office, down from score of 15 4-weeks ago. 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 She has experienced increased stress with her job due to the pressure of students performing well on end-of-the-year testing.    Angela Powers has a past medical history of type 2 diabetes mellitus. Current treatment includes Metformin 500 mg BID. She tries to consume a heart healthy diet and go outdoors with her children to play at the park to increase physical activity. HgbA1C 8.3 ,not at goal per labs on 04/07/20. Angela Powers states she had been out of Metformin for approximately 30-months prior to labs. She admits to often missing her second dose of Metformin due to time constraints and business of tasks.     Angela Powers has a past history of ADHD. States onset of symptoms was in middle school. Symptoms include hyperactivity and inattentiveness. She tells me her father did not believe ADHD was a medical diagnosis, therefore she did not receive prescription medication management. She has adapted to symptoms by entering events/tasks into a calendar on her smart phone, setting reminder prompts on her [employer] device, writing lists, and depending on her spouse to remind her of tasks that need to be completed. She states her symptoms are not well-controlled. Discussed management of symptoms with various types of ADHD medications risks and benefits. Pt cautioned that stimulant medications can increase anxiety in some patients. Shared decision making used to initiate  treatment with Vyvanse.      Current Outpatient Medications on File Prior to Visit  Medication Sig Dispense Refill  . citalopram (CELEXA) 20 MG tablet TAKE 1 TABLET BY MOUTH EVERY DAY 90 tablet 2  . metFORMIN (GLUCOPHAGE) 500 MG tablet Take 1 tablet (500 mg total) by mouth 2 (two) times daily with a meal. 180 tablet 3    Past Medical History:  Diagnosis Date  . Allergy   . Anxiety   . Depression   . Diabetes mellitus without complication (HCC)   . Migraine headache   . Nephrolithiasis    Past Surgical History:  Procedure Laterality Date  . CESAREAN SECTION MULTI-GESTATIONAL N/A 02/26/2018   Procedure: CESAREAN SECTION MULTI-GESTATIONAL;  Surgeon: Mitchel Honour, DO;  Location: [facility];  Service: Obstetrics;  Laterality: N/A;    Family History  Problem Relation Age of Onset  . Mental illness Mother   . Hypertension Maternal Grandmother   . Breast cancer Maternal Grandmother    Social History   Socioeconomic History  . Marital status: Married    Spouse name: Not on file  . Number of children: 2  . Years of education: Not on file  . Highest education level: Not on file  Occupational History  . Occupation: 1rst grade teacher  Tobacco Use  . Smoking status: Never Smoker  . Smokeless tobacco: Never Used  Vaping Use  . Vaping Use: Never used  Substance and Sexual Activity  . Alcohol use: No  . Drug use: No  . Sexual activity: Not Currently    Birth control/protection: None  Other Topics Concern  . Not on file  Social History Narrative  . Not on file      Review of Systems  Constitutional: Negative for appetite change, chills, fatigue, fever and unexpected weight change.  HENT: Negative for congestion, ear pain, postnasal drip, rhinorrhea, sinus pressure, sinus pain, sore throat and tinnitus.   Eyes: Negative for pain.  Respiratory: Negative for cough and shortness of breath.   Cardiovascular: Negative for chest pain, palpitations and leg swelling.   Gastrointestinal: Negative for abdominal pain, constipation, diarrhea, nausea and vomiting.  Endocrine: Negative for cold intolerance, heat intolerance, polydipsia, polyphagia and polyuria.  Genitourinary: Negative for dysuria, frequency and hematuria.  Musculoskeletal: Negative for arthralgias, back pain, joint swelling and myalgias.  Skin: Negative for rash.  Allergic/Immunologic: Negative for environmental allergies.  Neurological: Negative for dizziness and headaches.  Hematological: Negative for adenopathy.  Psychiatric/Behavioral: Negative for decreased concentration and sleep disturbance. The patient is not nervous/anxious.      Objective:  BP 128/68   Pulse 74   Temp (!) 97.1 F (36.2 C)   Ht 5\' 8"  (1.727 m)   Wt 254 lb (115.2 kg)   SpO2 98%   BMI 38.62 kg/m   BP/Weight 05/05/2020 04/07/2020 06/02/2019  Systolic BP 128 120 123  Diastolic BP 68 68 75  Wt. (Lbs) 254 255 240  BMI 38.62 38.77 36.49    Physical Exam Vitals reviewed.  HENT:     Head: Normocephalic.  Eyes:     Comments: Glasses in place  Cardiovascular:     Rate and Rhythm: Normal rate and regular rhythm.     Pulses: Normal pulses.     Heart sounds: Normal heart sounds.  Pulmonary:     Effort: Pulmonary effort is normal.     Breath sounds: Normal breath sounds.  Abdominal:     General: Bowel sounds are normal.     Palpations: Abdomen is soft.  Skin:    General: Skin is warm and dry.     Capillary Refill: Capillary refill takes less than 2 seconds.  Neurological:     General: No focal deficit present.     Mental Status: She is alert and oriented to person, place, and time.  Psychiatric:        Mood and Affect: Mood normal.        Behavior: Behavior normal.     Lab Results  Component Value Date   WBC 12.0 (H) 04/07/2020   HGB 13.5 04/07/2020   HCT 40.1 04/07/2020   PLT 435 04/07/2020   GLUCOSE 116 (H) 04/07/2020   ALT 20 04/07/2020   AST 26 04/07/2020   NA 135 04/07/2020   K 4.5  04/07/2020   CL 95 (L) 04/07/2020   CREATININE 0.56 (L) 04/07/2020   BUN 9 04/07/2020   CO2 22 04/07/2020   TSH 1.650 04/07/2020   HGBA1C 8.3 (H) 04/07/2020   MICROALBUR 30 04/07/2020      Assessment & Plan:   1. Type 2 diabetes mellitus with hyperglycemia, without long-term current use of insulin (HCC) - metFORMIN (GLUMETZA) 1000 MG (MOD) 24 hr tablet; Take 1 tablet (1,000 mg total) by mouth daily with breakfast.  Dispense: 90 tablet; Refill: 0  2. Class 2 severe obesity due to excess calories with serious comorbidity and body mass index (BMI) of 38.0 to 38.9 in adult [facility]) -Heart healthy diet  -Continue physical activity  3. BMI 38.0-38.9,adult -Heart healthy diet -Continue physical activity  4. Anxiety with depression - citalopram (CELEXA)  10 MG tablet; Take 3 tablets (30 mg total) by mouth daily.  Dispense: 90 tablet; Refill: 0 - citalopram (CELEXA) 20 MG tablet; Take 1 tablet (20 mg total) by mouth daily.  Dispense: 90 tablet; Refill: 0  5. Adult ADHD - lisdexamfetamine (VYVANSE) 20 MG capsule; Take 1 capsule (20 mg total) by mouth daily.  Dispense: 30 capsule; Refill: 0   Increase Citalopram to 30 mg daily Continue heart healthy diet and physical activity Increase Metformin XR 1,000 mg daily Vyvanse 20 mg daily for ADHD Notify office immediately of any adverse effects of medication Follow-up in 33-months  Follow-up: 72-months  An After Visit Summary was printed and given to the patient.  Janie Morning, NP [facility] [phone]

## 2020-05-27 ENCOUNTER — Other Ambulatory Visit: Payer: Self-pay | Admitting: Nurse Practitioner

## 2020-05-27 DIAGNOSIS — F418 Other specified anxiety disorders: Secondary | ICD-10-CM

## 2020-06-01 ENCOUNTER — Other Ambulatory Visit: Payer: Self-pay | Admitting: Nurse Practitioner

## 2020-06-01 DIAGNOSIS — F909 Attention-deficit hyperactivity disorder, unspecified type: Secondary | ICD-10-CM

## 2020-06-01 MED ORDER — LISDEXAMFETAMINE DIMESYLATE 20 MG PO CAPS: 20.0000 mg | ORAL_CAPSULE | Freq: Every day | ORAL | 0 refills | Status: DC

## 2020-06-12 IMAGING — US US OB EACH ADDL GEST<[ID]
1 series · 16 of 28 positions shown · non-contrast
Comparison: None.

CLINICAL DATA: First trimester vaginal bleeding. Gestational age by
LMP of 6 weeks 4 days.

EXAM:
TWIN OBSTETRIC <14WK US AND TRANSVAGINAL OB US

[Series 1: us ob each addl gest<(id) · 36 acquisitions, 16 frames shown]
[im 1/36]
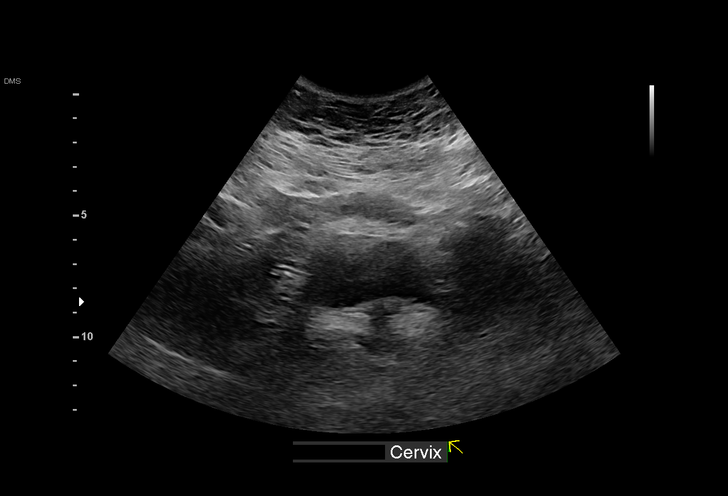
[im 3/36]
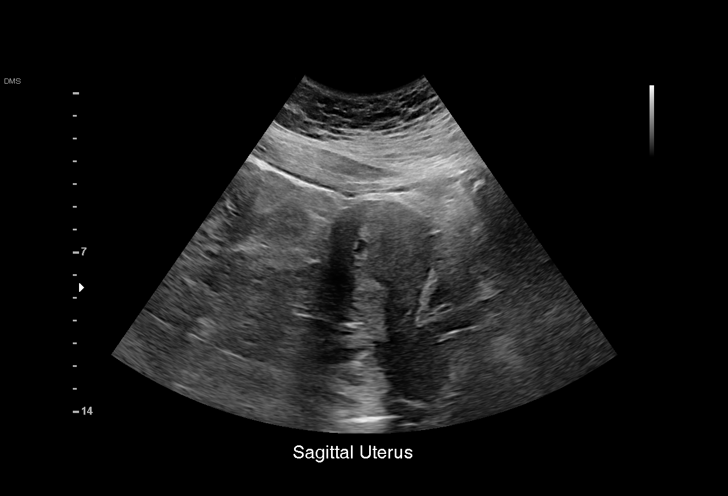
[im 6/36]
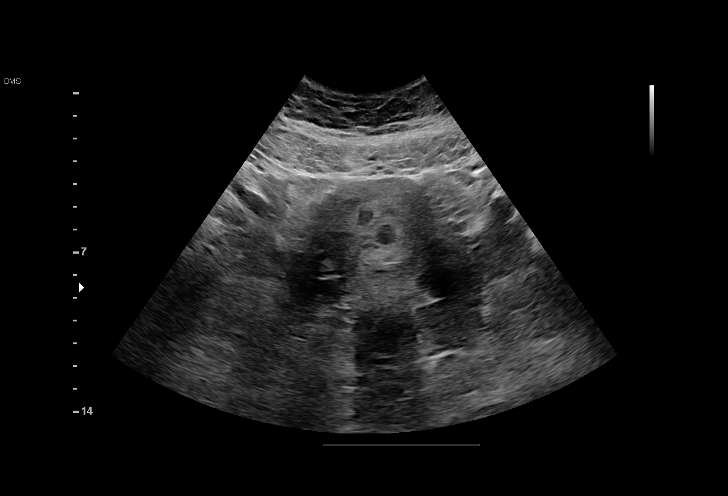
[im 8/36]
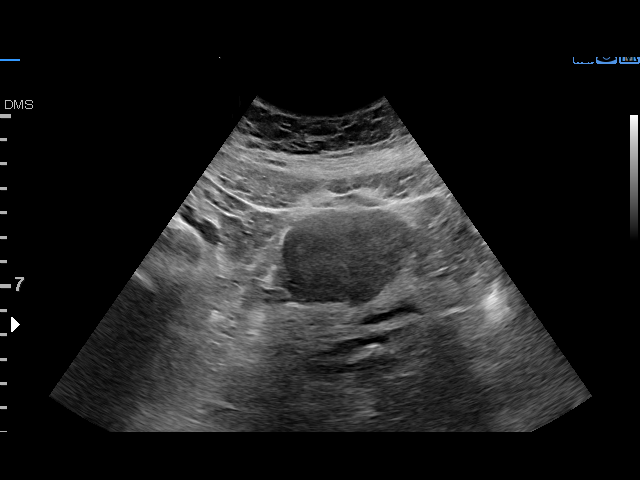
[im 10/36]
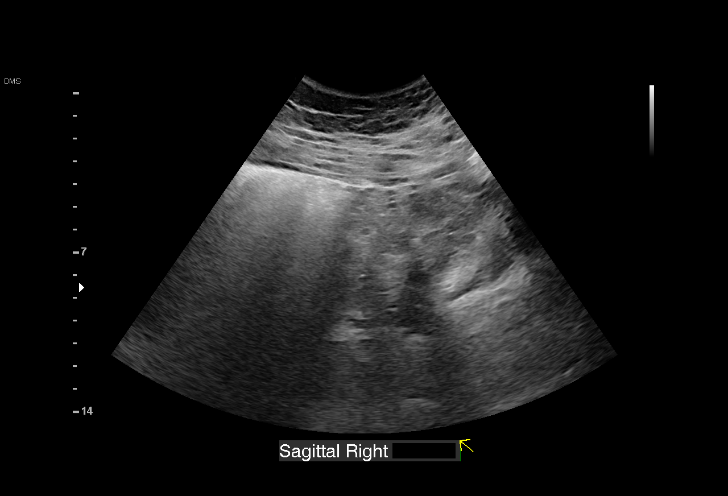
[im 12/36]
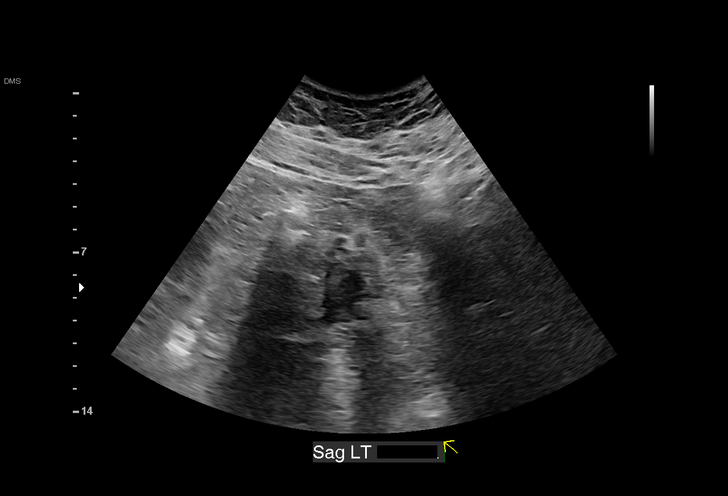
[im 15/36]
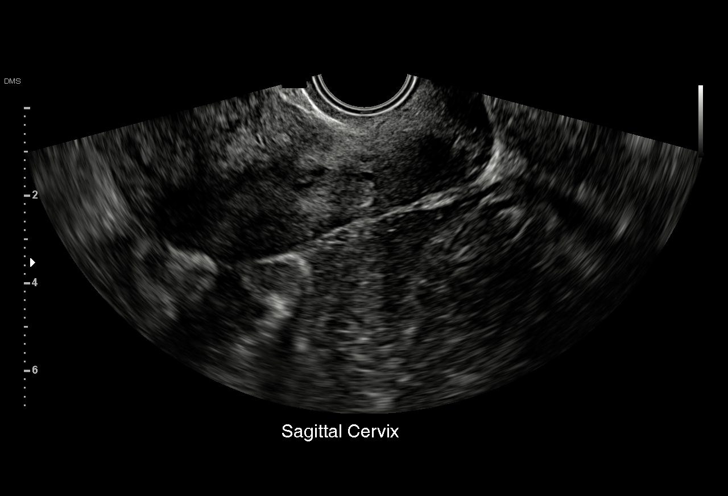
[im 17/36]
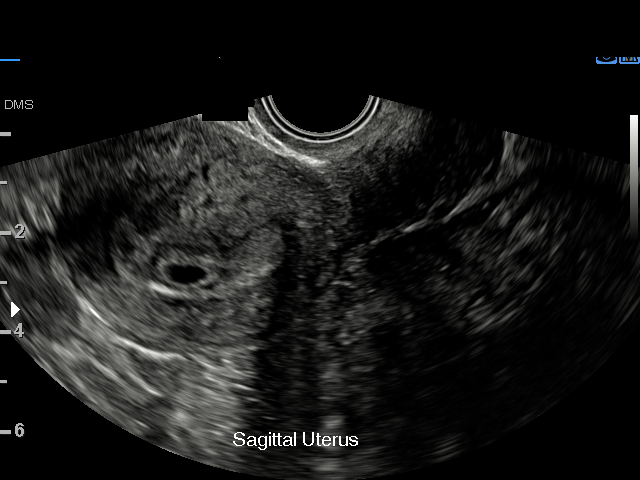
[im 19/36]
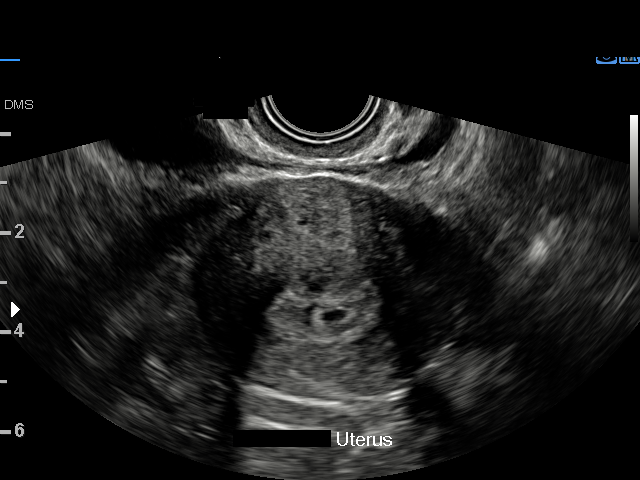
[im 21/36]
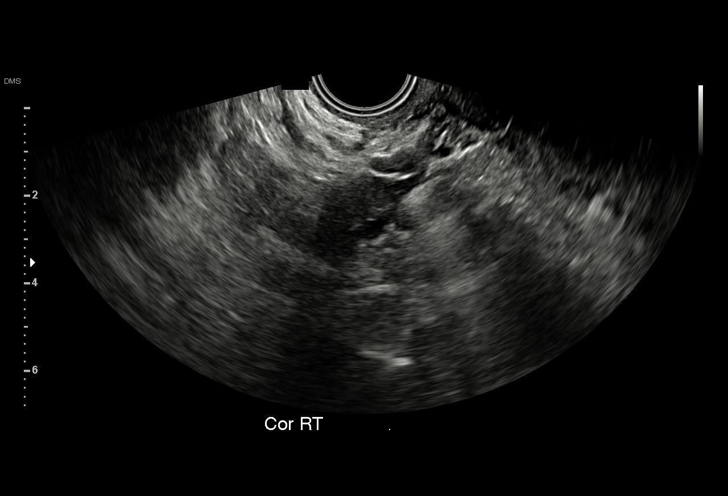
[im 24/36]
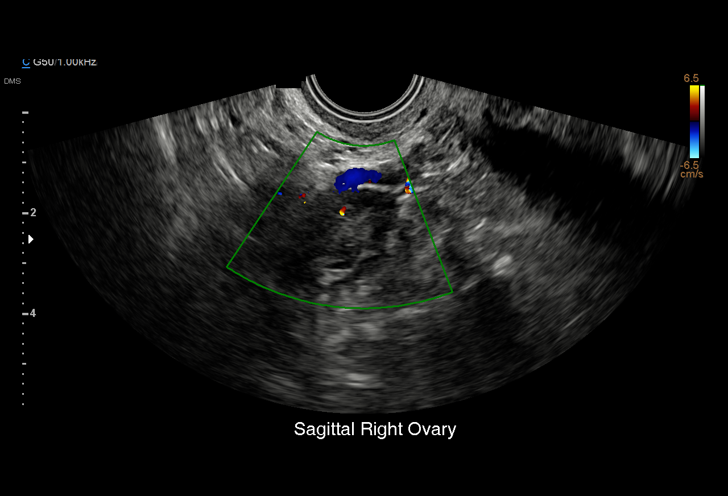
[im 26/36]
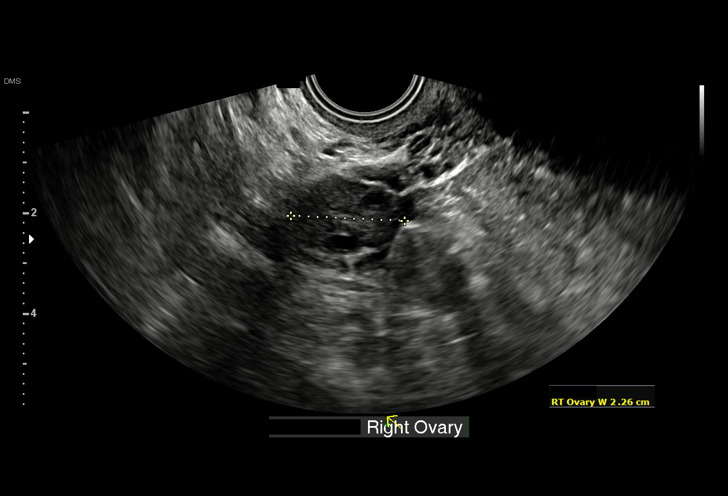
[im 28/36]
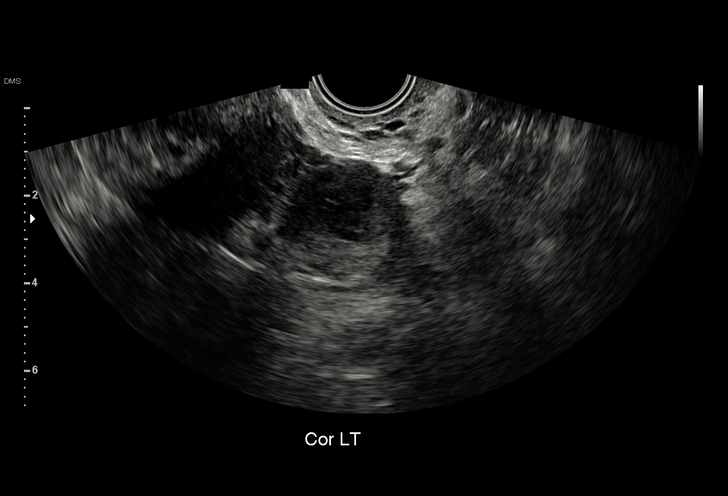
[im 30/36]
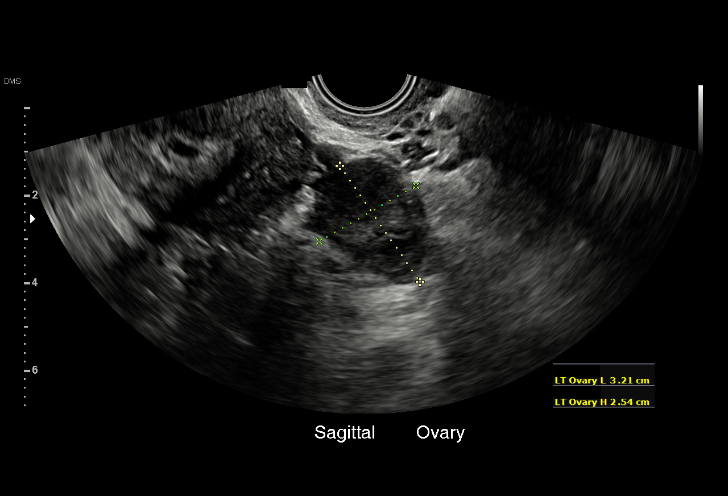
[im 33/36]
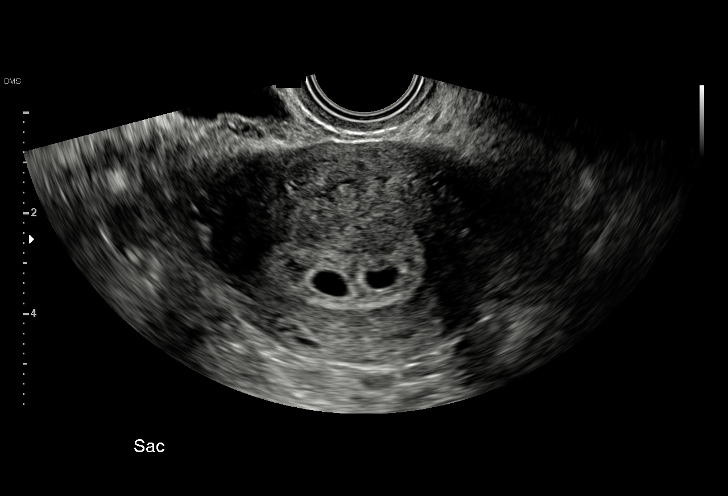
[im 36/36]
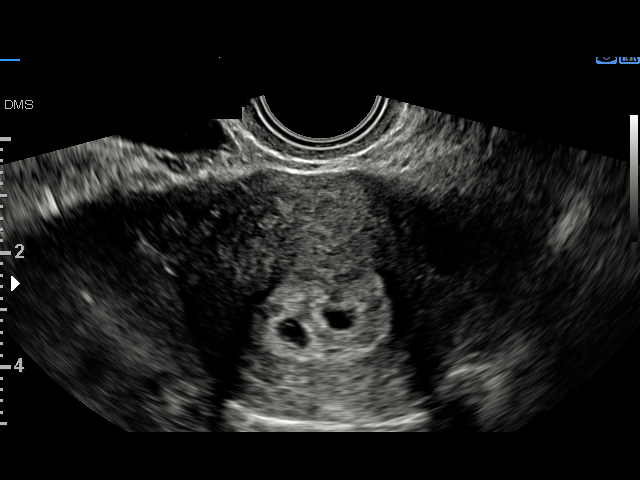

[16 of 28 positions shown; findings below may reference images not displayed]

FINDINGS: Number of IUPs:  2

Chorionicity/Amnionicity:  Dichorionic-diamniotic (thick membrane)

TWIN 1

Yolk sac:  Not Visualized.

Embryo:  Not Visualized.

MSD: 8 mm   5 w   3 d

TWIN 2

Yolk sac:  Not Visualized.

Embryo:  Not Visualized.

MSD: 7 mm   5 w   2 d

Subchorionic hemorrhage:  None visualized.

Maternal uterus/adnexae: Both ovaries are normal appearance. No mass
or abnormal free fluid identified.
IMPRESSION: Early dichorionic twin IUP at 5 weeks 2 days by mean sac diameter.
Consider following b-hCG levels, with followup ultrasound to assess
viability in 14 days.

No significant maternal uterine or adnexal abnormality identified.

## 2020-06-30 ENCOUNTER — Other Ambulatory Visit: Payer: Self-pay

## 2020-06-30 DIAGNOSIS — F909 Attention-deficit hyperactivity disorder, unspecified type: Secondary | ICD-10-CM

## 2020-06-30 MED ORDER — LISDEXAMFETAMINE DIMESYLATE 20 MG PO CAPS: 20.0000 mg | ORAL_CAPSULE | Freq: Every day | ORAL | 0 refills | Status: DC

## 2020-07-27 ENCOUNTER — Telehealth: Payer: Self-pay

## 2020-07-27 NOTE — Telephone Encounter (Signed)
I left a message on the number(s) listed in the patients chart requesting the patient to call back regarding the upcomming appointment for 08/05/2020. The provider is out of the office that day. The appointment has been canceled. Waiting for the patient to return the call. 

## 2020-08-01 ENCOUNTER — Other Ambulatory Visit: Payer: Self-pay | Admitting: Nurse Practitioner

## 2020-08-01 DIAGNOSIS — E1165 Type 2 diabetes mellitus with hyperglycemia: Secondary | ICD-10-CM

## 2020-08-05 ENCOUNTER — Other Ambulatory Visit: Payer: Self-pay

## 2020-08-05 ENCOUNTER — Encounter: Payer: Self-pay | Admitting: Nurse Practitioner

## 2020-08-05 ENCOUNTER — Ambulatory Visit: Payer: Managed Care, Other (non HMO) | Admitting: Nurse Practitioner

## 2020-08-05 VITALS — BP 126/72 | HR 71 | Temp 97.0°F | Ht 68.0 in | Wt 251.0 lb

## 2020-08-05 DIAGNOSIS — E1165 Type 2 diabetes mellitus with hyperglycemia: Secondary | ICD-10-CM | POA: Diagnosis not present

## 2020-08-05 DIAGNOSIS — F909 Attention-deficit hyperactivity disorder, unspecified type: Secondary | ICD-10-CM | POA: Diagnosis not present

## 2020-08-05 DIAGNOSIS — B351 Tinea unguium: Secondary | ICD-10-CM | POA: Diagnosis not present

## 2020-08-05 DIAGNOSIS — Z6838 Body mass index (BMI) 38.0-38.9, adult: Secondary | ICD-10-CM

## 2020-08-05 DIAGNOSIS — F418 Other specified anxiety disorders: Secondary | ICD-10-CM

## 2020-08-05 MED ORDER — LISDEXAMFETAMINE DIMESYLATE 30 MG PO CAPS: 30.0000 mg | ORAL_CAPSULE | Freq: Every day | ORAL | 0 refills | Status: DC

## 2020-08-05 MED ORDER — TERBINAFINE HCL 250 MG PO TABS: 250.0000 mg | ORAL_TABLET | Freq: Every day | ORAL | 0 refills | Status: DC

## 2020-08-05 NOTE — Patient Instructions (Signed)
Continue medications Increase Vyvanse to 30 mg daily Take Lamisil daily for nail infection Follow-up in 21-months  Fungal Nail Infection A fungal nail infection is a common infection of the toenails or fingernails. This condition affects toenails more often than fingernails. It often affects the great, or big, toes. More than one nail may be infected. The condition can be passed from person to person (is contagious). What are the causes? This condition is caused by a fungus. Several types of fungi can cause the infection. These fungi are common in moist and warm areas. If your hands or feet come into contact with the fungus, it may get into a crack in yourfingernail or toenail and cause the infection. What increases the risk? The following factors may make you more likely to develop this condition: Being female. Being of older age. Living with someone who has the fungus. Walking barefoot in areas where the fungus thrives, such as showers or locker rooms. Wearing shoes and socks that cause your feet to sweat. Having a nail injury or a recent nail surgery. Having certain medical conditions, such as: Athlete's foot. Diabetes. Psoriasis. Poor circulation. A weak body defense system (immune system). What are the signs or symptoms? Symptoms of this condition include: A pale spot on the nail. Thickening of the nail. A nail that becomes yellow or brown. A brittle or ragged nail edge. A crumbling nail. A nail that has lifted away from the nail bed. How is this diagnosed? This condition is diagnosed with a physical exam. Your health care provider maytake a scraping or clipping from your nail to test for the fungus. How is this treated? Treatment is not needed for mild infections. If you have significant nail changes, treatment may include: Antifungal medicines taken by mouth (orally). You may need to take the medicine for several weeks or several months, and you may not see the results for a  long time. These medicines can cause side effects. Ask your health care provider what problems to watch for. Antifungal nail polish or nail cream. These may be used along with oral antifungal medicines. Laser treatment of the nail. Surgery to remove the nail. This may be needed for the most severe infections. It can take a long time, usually up to a year, for the infection to go away.The infection may also come back. Follow these instructions at home: Medicines Take or apply over-the-counter and prescription medicines only as told by your health care provider. Ask your health care provider about using over-the-counter mentholated ointment on your nails. Nail care Trim your nails often. Wash and dry your hands and feet every day. Keep your feet dry: Wear absorbent socks, and change your socks frequently. Wear shoes that allow air to circulate, such as sandals or canvas tennis shoes. Throw out old shoes. Do not use artificial nails. If you go to a nail salon, make sure you choose one that uses clean instruments. Use antifungal foot powder on your feet and in your shoes. General instructions Do not share personal items, such as towels or nail clippers. Do not walk barefoot in shower rooms or locker rooms. Wear rubber gloves if you are working with your hands in wet areas. Keep all follow-up visits as told by your health care provider. This is important. Contact a health care provider if: Your infection is not getting better or it is getting worse after severalmonths. Summary A fungal nail infection is a common infection of the toenails or fingernails. Treatment is not needed for  mild infections. If you have significant nail changes, treatment may include taking medicine orally and applying medicine to your nails. It can take a long time, usually up to a year, for the infection to go away. The infection may also come back. Take or apply over-the-counter and prescription medicines only as  told by your health care provider. Follow instructions for taking care of your nails to help prevent infection from coming back or spreading. This information is not intended to replace advice given to you by your health care provider. Make sure you discuss any questions you have with your healthcare provider. Document Revised: 04/19/2018 Document Reviewed: 06/02/2017 Elsevier Patient Education  2022 [employer].

## 2020-08-05 NOTE — Progress Notes (Signed)
Subjective:  Patient ID: Angela Powers, female    DOB: 04/08/1990  Age: 30 y.o. MRN: [id number]  Chief Complaint  Patient presents with   Diabetes   HPI Angela Powers is a 30 year old Caucasian female that presents for follow-up of type 2 diabetes mellitus, anxiety, and ADHD.She tells me that she has developed a yellow, thickened nail to left foot second toe for several months. Denies previous treatments at home.   Diabetes Mellitus Type II, Follow-up  Lab Results  Component Value Date   HGBA1C 8.3 (H) 04/07/2020   HGBA1C 6.9 (H) 08/09/2017   HGBA1C 7.0 03/20/2017   Wt Readings from Last 3 Encounters:  08/05/20 251 lb (113.9 kg)  05/05/20 254 lb (115.2 kg)  04/07/20 255 lb (115.7 kg)   Last seen for diabetes 3 months ago.  Management since then includes Metformin 1,000 mg daily. She reports excellent compliance with treatment. She is not having side effects.  Symptoms: No fatigue No foot ulcerations  No appetite changes No nausea  No paresthesia of the feet  No polydipsia  No polyuria No visual disturbances   No vomiting     Home blood sugar records:  not checking blood sugars at home  Episodes of hypoglycemia? 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 Current treatment includes Vyvanse 20 mg. Pt states symptoms have improved but medication wears off in the afternoon. She denies side effects of medication.    Anxiety, Follow-up  She was last seen for anxiety 3 months ago. Changes  made at last visit include Celexa 30 mg daily.   She reports excellent compliance with treatment. She reports good tolerance of treatment. She is not having side effects.   She feels her anxiety is mild and Improved since last visit.  Symptoms: No chest pain No difficulty concentrating  No dizziness No fatigue  No feelings of losing control No insomnia  No irritable No palpitations  No panic attacks No racing thoughts  No shortness of breath No sweating  No tremors/shakes    GAD-7 Results GAD-7 Generalized Anxiety Disorder Screening Tool 08/05/2020 04/07/2020  1. Feeling Nervous, Anxious, or on Edge 0 2  2. Not Being Able to Stop or Control Worrying 0 3  3. Worrying Too Much About Different Things 0 3  4. Trouble Relaxing 1 3  5. Being So Restless it's Hard To Sit Still 0 1  6. Becoming Easily Annoyed or Irritable 0 2  7. Feeling Afraid As If Something Awful Might Happen 0 1  Total GAD-7 Score 1 15  Difficulty At Work, Home, or Getting  Along With Others? Not difficult at all Very difficult    PHQ-9 Scores PHQ9 SCORE ONLY 08/05/2020 05/05/2020 04/07/2020  PHQ-9 Total Score 1 9 15       Current Outpatient Medications on File Prior to Visit  Medication Sig Dispense Refill   citalopram (CELEXA) 10 MG tablet TAKE 3 TABLETS BY MOUTH EVERY DAY 270 tablet 1   citalopram (CELEXA) 20  MG tablet Take 1 tablet (20 mg total) by mouth daily. 90 tablet 0   lisdexamfetamine (VYVANSE) 20 MG capsule Take 1 capsule (20 mg total) by mouth daily. 30 capsule 0   metFORMIN (GLUCOPHAGE-XR) 500 MG 24 hr tablet TAKE 2 TABLETS BY MOUTH EVERY DAY WITH BREAKFAST 180 tablet 2   No current facility-administered medications on file prior to visit.   Past Medical History:  Diagnosis Date   Allergy    Anxiety    Depression    Diabetes mellitus without complication (HCC)    Migraine headache    Nephrolithiasis    Past Surgical History:  Procedure Laterality Date   CESAREAN SECTION MULTI-GESTATIONAL N/A  02/26/2018   Procedure: CESAREAN SECTION MULTI-GESTATIONAL;  Surgeon: Mitchel Honour, DO;  Location: [facility];  Service: Obstetrics;  Laterality: N/A;    Family History  Problem Relation Age of Onset   Mental illness Mother    Hypertension Maternal Grandmother    Breast cancer Maternal Grandmother    Social History   Socioeconomic History   Marital status: Married    Spouse name: Not on file   Number of children: 2   Years of education: Not on file   Highest education level: Not on file  Occupational History   Occupation: 1rst grade teacher  Tobacco Use   Smoking status: Never   Smokeless tobacco: Never  Vaping Use   Vaping Use: Never used  Substance and Sexual Activity   Alcohol use: No   Drug use: No   Sexual activity: Not Currently    Birth control/protection: None  Other Topics Concern   Not on file  Social History Narrative   Not on file   Social Determinants of Health   Financial Resource Strain: Not on file  Food Insecurity: Not on file  Transportation Needs: Not on file  Physical Activity: Not on file  Stress: Not on file  Social Connections: Not on file    Review of Systems  Constitutional:  Negative for appetite change, fatigue and fever.  HENT:  Negative for congestion, ear pain, sinus pressure and sore throat.   Eyes:  Negative for pain.  Respiratory:  Negative for cough, chest tightness, shortness of breath and wheezing.   Cardiovascular:  Negative for chest pain and palpitations.  Gastrointestinal:  Negative for abdominal pain, constipation, diarrhea, nausea and vomiting.  Genitourinary:  Negative for dysuria and hematuria.  Musculoskeletal:  Negative for arthralgias, back pain, joint swelling and myalgias.  Skin:  Negative for rash.  Neurological:  Negative for dizziness, weakness and headaches.  Psychiatric/Behavioral:  Negative for dysphoric mood. The patient is not nervous/anxious.     Objective:  BP 126/72 (BP Location: Left Arm,  Patient Position: Sitting)   Pulse 71   Temp (!) 97 F (36.1 C) (Temporal)   Ht 5\' 8"  (1.727 m)   Wt 251 lb (113.9 kg)   SpO2 99%   BMI 38.16 kg/m    BP/Weight 05/05/2020 04/07/2020 06/02/2019  Systolic BP 128 120 123  Diastolic BP 68 68 75  Wt. (Lbs) 254 255 240  BMI 38.62 38.77 36.49    Physical Exam Vitals reviewed.  Constitutional:      Appearance: Normal appearance.  HENT:     Right Ear: Tympanic membrane normal.     Left Ear: Tympanic membrane normal.     Nose: Nose normal.     Mouth/Throat:     Mouth: Mucous membranes are moist.  Cardiovascular:     Rate and Rhythm:  Normal rate and regular rhythm.     Pulses:          Dorsalis pedis pulses are 2+ on the right side and 2+ on the left side.       Posterior tibial pulses are 2+ on the right side and 2+ on the left side.     Heart sounds: Normal heart sounds.  Pulmonary:     Effort: Pulmonary effort is normal.     Breath sounds: Normal breath sounds.  Abdominal:     Palpations: Abdomen is soft.  Musculoskeletal:        General: Normal range of motion.     Cervical back: Normal range of motion.  Feet:     Right foot:     Skin integrity: Skin integrity normal.     Toenail Condition: Right toenails are normal.     Left foot:     Toenail Condition: Left toenails are abnormally thick. Fungal disease present. Skin:    General: Skin is warm and dry.  Neurological:     Mental Status: She is alert and oriented to person, place, and time.  Psychiatric:        Mood and Affect: Mood normal.        Behavior: Behavior normal.        Thought Content: Thought content normal.        Judgment: Judgment normal.     Lab Results  Component Value Date   WBC 12.0 (H) 04/07/2020   HGB 13.5 04/07/2020   HCT 40.1 04/07/2020   PLT 435 04/07/2020   GLUCOSE 116 (H) 04/07/2020   ALT 20 04/07/2020   AST 26 04/07/2020   NA 135 04/07/2020   K 4.5 04/07/2020   CL 95 (L) 04/07/2020   CREATININE 0.56 (L) 04/07/2020   BUN 9  04/07/2020   CO2 22 04/07/2020   TSH 1.650 04/07/2020   HGBA1C 8.3 (H) 04/07/2020   MICROALBUR 30 04/07/2020      Assessment & Plan:    1. Type 2 diabetes mellitus with hyperglycemia, without long-term current use of insulin (HCC) - CBC With Diff/Platelet - Comprehensive metabolic panel - Lipid panel - Hemoglobin A1c  2. Anxiety with depression-well controlled -continue Celexa 30 mg daily  3. Adult ADHD-not at goal - lisdexamfetamine (VYVANSE) 30 MG capsule; Take 1 capsule (30 mg total) by mouth daily.  Dispense: 30 capsule; Refill: 0  4. Onychomycosis of toenail - terbinafine (LAMISIL) 250 MG tablet; Take 1 tablet (250 mg total) by mouth daily.  Dispense: 90 tablet; Refill: 0 -CMP  5. BMI 38.0-38.9,adult - CBC With Diff/Platelet - Comprehensive metabolic panel - Lipid panel - Hemoglobin A1c  -increase physical activity  -heart healthy diet  Continue medications Increase Vyvanse to 30 mg daily Take Lamisil daily for nail infection Follow-up in 35-months     Follow-up: 53-months  An After Visit Summary was printed and given to the patient.  I,Lauren M Auman,acting as a Neurosurgeon for [employer], NP.,have documented all relevant documentation on the behalf of Janie Morning, NP,as directed by  Janie Morning, NP while in the presence of Janie Morning, NP.    I, Janie Morning, NP, have reviewed all documentation for this visit. The documentation on 08/05/20 for the exam, diagnosis, procedures, and orders are all accurate and complete.    Janie Morning, NP [facility] [phone]

## 2020-08-06 LAB — CBC WITH DIFF/PLATELET
Basophils Absolute: 0 10*3/uL (ref 0.0–0.2)
Basos: 0 %
EOS (ABSOLUTE): 0.2 10*3/uL (ref 0.0–0.4)
Eos: 2 %
Hematocrit: 39.2 % (ref 34.0–46.6)
Hemoglobin: 12.8 g/dL (ref 11.1–15.9)
Immature Grans (Abs): 0 10*3/uL (ref 0.0–0.1)
Immature Granulocytes: 0 %
Lymphocytes Absolute: 2.6 10*3/uL (ref 0.7–3.1)
Lymphs: 26 %
MCH: 27.5 pg (ref 26.6–33.0)
MCHC: 32.7 g/dL (ref 31.5–35.7)
MCV: 84 fL (ref 79–97)
Monocytes Absolute: 0.6 10*3/uL (ref 0.1–0.9)
Monocytes: 6 %
Neutrophils Absolute: 6.6 10*3/uL (ref 1.4–7.0)
Neutrophils: 66 %
Platelets: 401 10*3/uL (ref 150–450)
RBC: 4.65 x10E6/uL (ref 3.77–5.28)
RDW: 13.1 % (ref 11.7–15.4)
WBC: 10 10*3/uL (ref 3.4–10.8)

## 2020-08-06 LAB — LIPID PANEL
Chol/HDL Ratio: 6.8 ratio — ABNORMAL HIGH (ref 0.0–4.4)
Cholesterol, Total: 273 mg/dL — ABNORMAL HIGH (ref 100–199)
HDL: 40 mg/dL (ref >39)
LDL Chol Calc (NIH): 185 mg/dL — ABNORMAL HIGH (ref 0–99)
Triglycerides: 247 mg/dL — ABNORMAL HIGH (ref 0–149)
VLDL Cholesterol Cal: 48 mg/dL — ABNORMAL HIGH (ref 5–40)

## 2020-08-06 LAB — COMPREHENSIVE METABOLIC PANEL
ALT: 17 IU/L (ref 0–32)
AST: 20 IU/L (ref 0–40)
Albumin/Globulin Ratio: 1.4 (ref 1.2–2.2)
Albumin: 4.1 g/dL (ref 3.9–5.0)
Alkaline Phosphatase: 99 IU/L (ref 44–121)
BUN/Creatinine Ratio: 15 (ref 9–23)
BUN: 9 mg/dL (ref 6–20)
Bilirubin Total: 0.3 mg/dL (ref 0.0–1.2)
CO2: 26 mmol/L (ref 20–29)
Calcium: 9.3 mg/dL (ref 8.7–10.2)
Chloride: 98 mmol/L (ref 96–106)
Creatinine, Ser: 0.62 mg/dL (ref 0.57–1.00)
Globulin, Total: 3 g/dL (ref 1.5–4.5)
Glucose: 149 mg/dL — ABNORMAL HIGH (ref 65–99)
Potassium: 4.8 mmol/L (ref 3.5–5.2)
Sodium: 138 mmol/L (ref 134–144)
Total Protein: 7.1 g/dL (ref 6.0–8.5)
eGFR: 124 mL/min/{1.73_m2} (ref >59)

## 2020-08-06 LAB — CARDIOVASCULAR RISK ASSESSMENT

## 2020-08-06 LAB — HEMOGLOBIN A1C
Est. average glucose Bld gHb Est-mCnc: 171 mg/dL
Hgb A1c MFr Bld: 7.6 % — ABNORMAL HIGH (ref 4.8–5.6)

## 2020-08-07 ENCOUNTER — Other Ambulatory Visit: Payer: Self-pay

## 2020-08-07 MED ORDER — ROSUVASTATIN CALCIUM 10 MG PO TABS: 10.0000 mg | ORAL_TABLET | Freq: Every day | ORAL | 1 refills | Status: DC

## 2020-09-04 ENCOUNTER — Other Ambulatory Visit: Payer: Self-pay | Admitting: Nurse Practitioner

## 2020-09-04 DIAGNOSIS — F909 Attention-deficit hyperactivity disorder, unspecified type: Secondary | ICD-10-CM

## 2020-09-04 MED ORDER — LISDEXAMFETAMINE DIMESYLATE 30 MG PO CAPS: 30.0000 mg | ORAL_CAPSULE | Freq: Every day | ORAL | 0 refills | Status: DC

## 2020-09-04 NOTE — Telephone Encounter (Signed)
Angela Powers patient

## 2020-09-09 LAB — HM DIABETES EYE EXAM

## 2020-10-05 ENCOUNTER — Other Ambulatory Visit: Payer: Self-pay

## 2020-10-05 DIAGNOSIS — F909 Attention-deficit hyperactivity disorder, unspecified type: Secondary | ICD-10-CM

## 2020-10-05 MED ORDER — LISDEXAMFETAMINE DIMESYLATE 30 MG PO CAPS: 30.0000 mg | ORAL_CAPSULE | Freq: Every day | ORAL | 0 refills | Status: DC

## 2020-10-30 ENCOUNTER — Other Ambulatory Visit: Payer: Self-pay | Admitting: Nurse Practitioner

## 2020-10-30 DIAGNOSIS — F418 Other specified anxiety disorders: Secondary | ICD-10-CM

## 2020-11-02 ENCOUNTER — Other Ambulatory Visit: Payer: Self-pay

## 2020-11-02 DIAGNOSIS — F909 Attention-deficit hyperactivity disorder, unspecified type: Secondary | ICD-10-CM

## 2020-11-02 MED ORDER — ROSUVASTATIN CALCIUM 10 MG PO TABS: 10.0000 mg | ORAL_TABLET | Freq: Every day | ORAL | 1 refills | Status: DC

## 2020-11-02 MED ORDER — LISDEXAMFETAMINE DIMESYLATE 30 MG PO CAPS: 30.0000 mg | ORAL_CAPSULE | Freq: Every day | ORAL | 0 refills | Status: DC

## 2020-11-11 ENCOUNTER — Ambulatory Visit: Payer: Managed Care, Other (non HMO) | Admitting: Nurse Practitioner

## 2020-11-13 ENCOUNTER — Other Ambulatory Visit: Payer: Self-pay | Admitting: Nurse Practitioner

## 2020-11-13 ENCOUNTER — Ambulatory Visit: Payer: Managed Care, Other (non HMO) | Admitting: Nurse Practitioner

## 2020-11-13 ENCOUNTER — Encounter: Payer: Self-pay | Admitting: Nurse Practitioner

## 2020-11-13 ENCOUNTER — Other Ambulatory Visit: Payer: Self-pay

## 2020-11-13 ENCOUNTER — Telehealth: Payer: Self-pay

## 2020-11-13 VITALS — BP 110/66 | HR 65 | Temp 96.8°F | Ht 68.0 in | Wt 242.0 lb

## 2020-11-13 DIAGNOSIS — R110 Nausea: Secondary | ICD-10-CM

## 2020-11-13 DIAGNOSIS — E1165 Type 2 diabetes mellitus with hyperglycemia: Secondary | ICD-10-CM | POA: Diagnosis not present

## 2020-11-13 DIAGNOSIS — E782 Mixed hyperlipidemia: Secondary | ICD-10-CM | POA: Diagnosis not present

## 2020-11-13 DIAGNOSIS — F909 Attention-deficit hyperactivity disorder, unspecified type: Secondary | ICD-10-CM | POA: Diagnosis not present

## 2020-11-13 DIAGNOSIS — Z6836 Body mass index (BMI) 36.0-36.9, adult: Secondary | ICD-10-CM

## 2020-11-13 DIAGNOSIS — F339 Major depressive disorder, recurrent, unspecified: Secondary | ICD-10-CM

## 2020-11-13 MED ORDER — OZEMPIC (0.25 OR 0.5 MG/DOSE) 2 MG/1.5ML ~~LOC~~ SOPN: 0.5000 mg | PEN_INJECTOR | SUBCUTANEOUS | 0 refills | Status: DC

## 2020-11-13 MED ORDER — ONDANSETRON 4 MG PO TBDP
4.0000 mg | ORAL_TABLET | Freq: Three times a day (TID) | ORAL | 1 refills | Status: DC | PRN
Start: 2020-11-13 — End: 2021-01-29

## 2020-11-13 MED ORDER — OZEMPIC (1 MG/DOSE) 4 MG/3ML ~~LOC~~ SOPN: 1.0000 mg | PEN_INJECTOR | SUBCUTANEOUS | 2 refills | Status: DC

## 2020-11-13 MED ORDER — OZEMPIC (0.25 OR 0.5 MG/DOSE) 2 MG/1.5ML ~~LOC~~ SOPN: 0.5000 mg | PEN_INJECTOR | SUBCUTANEOUS | 1 refills | Status: DC

## 2020-11-13 NOTE — Patient Instructions (Addendum)
Begin Ozempic 0.25 injection weekly for 4-weeks Then increase to 0.5 injection weekly for 4-weeks Then increase to 1 mg injection weekly for 4-weeks Continue medications Take Zofran 4 mg ODT as needed for nausea Follow-up in 73-months   Diabetes Mellitus and Nutrition, Adult When you have diabetes, or diabetes mellitus, it is very important to have healthy eating habits because your blood sugar (glucose) levels are greatly affected by what you eat and drink. Eating healthy foods in the right amounts, at about the same times every day, can help you: Manage your blood glucose. Lower your risk of heart disease. Improve your blood pressure. Reach or maintain a healthy weight. What can affect my meal plan? Every person with diabetes is different, and each person has different needs for a meal plan. Your health care provider may recommend that you work with a dietitian to make a meal plan that is best for you. Your meal plan may vary depending on factors such as: The calories you need. The medicines you take. Your weight. Your blood glucose, blood pressure, and cholesterol levels. Your activity level. Other health conditions you have, such as heart or kidney disease. How do carbohydrates affect me? Carbohydrates, also called carbs, affect your blood glucose level more than any other type of food. Eating carbs raises the amount of glucose in your blood. It is important to know how many carbs you can safely have in each meal. This is different for every person. Your dietitian can help you calculate how many carbs you should have at each meal and for each snack. How does alcohol affect me? Alcohol can cause a decrease in blood glucose (hypoglycemia), especially if you use insulin or take certain diabetes medicines by mouth. Hypoglycemia can be a life-threatening condition. Symptoms of hypoglycemia, such as sleepiness, dizziness, and confusion, are similar to symptoms of having too much alcohol. Do  not drink alcohol if: Your health care provider tells you not to drink. You are pregnant, may be pregnant, or are planning to become pregnant. If you drink alcohol: Limit how much you have to: 0-1 drink a day for women. 0-2 drinks a day for men. Know how much alcohol is in your drink. In the [place] one drink equals one 12 oz bottle of beer (355 mL), one 5 oz glass of wine (148 mL), or one 1 oz glass of hard liquor (44 mL). Keep yourself hydrated with water, diet soda, or unsweetened iced tea. Keep in mind that regular soda, juice, and other mixers may contain a lot of sugar and must be counted as carbs. What are tips for following this plan? Reading food labels Start by checking the serving size on the Nutrition Facts label of packaged foods and drinks. The number of calories and the amount of carbs, fats, and other nutrients listed on the label are based on one serving of the item. Many items contain more than one serving per package. Check the total grams (g) of carbs in one serving. Check the number of grams of saturated fats and trans fats in one serving. Choose foods that have a low amount or none of these fats. Check the number of milligrams (mg) of salt (sodium) in one serving. Most people should limit total sodium intake to less than 2,300 mg per day. Always check the nutrition information of foods labeled as "low-fat" or "nonfat." These foods may be higher in added sugar or refined carbs and should be avoided. Talk to your dietitian to identify your daily goals  for nutrients listed on the label. Shopping Avoid buying canned, pre-made, or processed foods. These foods tend to be high in fat, sodium, and added sugar. Shop around the outside edge of the grocery store. This is where you will most often find fresh fruits and vegetables, bulk grains, fresh meats, and fresh dairy products. Cooking Use low-heat cooking methods, such as baking, instead of high-heat cooking methods, such as deep  frying. Cook using healthy oils, such as olive, canola, or sunflower oil. Avoid cooking with butter, cream, or high-fat meats. Meal planning Eat meals and snacks regularly, preferably at the same times every day. Avoid going long periods of time without eating. Eat foods that are high in fiber, such as fresh fruits, vegetables, beans, and whole grains. Eat 4-6 oz (112-168 g) of lean protein each day, such as lean meat, chicken, fish, eggs, or tofu. One ounce (oz) (28 g) of lean protein is equal to: 1 oz (28 g) of meat, chicken, or fish. 1 egg.  cup (62 g) of tofu. Eat some foods each day that contain healthy fats, such as avocado, nuts, seeds, and fish. What foods should I eat? Fruits Berries. Apples. Oranges. Peaches. Apricots. Plums. Grapes. Mangoes. Papayas. Pomegranates. Kiwi. Cherries. Vegetables Leafy greens, including lettuce, spinach, kale, chard, collard greens, mustard greens, and cabbage. Beets. Cauliflower. Broccoli. Carrots. Green beans. Tomatoes. Peppers. Onions. Cucumbers. Brussels sprouts. Grains Whole grains, such as whole-wheat or whole-grain bread, crackers, tortillas, cereal, and pasta. Unsweetened oatmeal. Quinoa. Brown or wild rice. Meats and other proteins Seafood. Poultry without skin. Lean cuts of poultry and beef. Tofu. Nuts. Seeds. Dairy Low-fat or fat-free dairy products such as milk, yogurt, and cheese. The items listed above may not be a complete list of foods and beverages you can eat and drink. Contact a dietitian for more information. What foods should I avoid? Fruits Fruits canned with syrup. Vegetables Canned vegetables. Frozen vegetables with butter or cream sauce. Grains Refined white flour and flour products such as bread, pasta, snack foods, and cereals. Avoid all processed foods. Meats and other proteins Fatty cuts of meat. Poultry with skin. Breaded or fried meats. Processed meat. Avoid saturated fats. Dairy Full-fat yogurt, cheese, or  milk. Beverages Sweetened drinks, such as soda or iced tea. The items listed above may not be a complete list of foods and beverages you should avoid. Contact a dietitian for more information. Questions to ask a health care provider Do I need to meet with a certified diabetes care and education specialist? Do I need to meet with a dietitian? What number can I call if I have questions? When are the best times to check my blood glucose? Where to find more information: American Diabetes Association: diabetes.org Academy of Nutrition and Dietetics: eatright.[employer] of Diabetes and Digestive and Kidney Diseases: StageSync.si Association of Diabetes Care & Education Specialists: diabeteseducator.org Summary It is important to have healthy eating habits because your blood sugar (glucose) levels are greatly affected by what you eat and drink. It is important to use alcohol carefully. A healthy meal plan will help you manage your blood glucose and lower your risk of heart disease. Your health care provider may recommend that you work with a dietitian to make a meal plan that is best for you. This information is not intended to replace advice given to you by your health care provider. Make sure you discuss any questions you have with your health care provider. Document Revised: 07/31/2019 Document Reviewed: 07/31/2019 Elsevier Patient Education  2022 Elsevier Inc.  

## 2020-11-13 NOTE — Telephone Encounter (Signed)
PA for Ozempic 4mg /17ml submitted and approved via covermymeds.

## 2020-11-13 NOTE — Progress Notes (Signed)
Subjective:  Patient ID: Angela Powers, female    DOB: 06-13-1990  Age: 30 y.o. MRN: [id number]  Chief Complaint  Patient presents with   Diabetes   Depression   Hypothyroidism    HPI  Angela Powers is a 30 year old Caucasian female that presents for follow-up of type 2 diabetes mellitus, hyperlipidemia, and depression. She denies any acute medical problems today.    Diabetes Mellitus Type II, Follow-up  Lab Results  Component Value Date   HGBA1C 7.6 (H) 08/05/2020   HGBA1C 8.3 (H) 04/07/2020   HGBA1C 6.9 (H) 08/09/2017   Wt Readings from Last 3 Encounters:  11/13/20 242 lb (109.8 kg)  08/05/20 251 lb (113.9 kg)  05/05/20 254 lb (115.2 kg)   Last seen for diabetes 3 months ago.  Management since then includes Metformin 1,000 mg QD. She reports excellent compliance with treatment. She is not having side effects.  Symptoms: No fatigue No foot ulcerations  No appetite changes Yes nausea  No paresthesia of the feet  No polydipsia  No polyuria No visual disturbances   No vomiting     Home blood sugar records:  not being checked  Episodes of hypoglycemia? No    Current insulin regiment:  Most Recent Eye Exam: 2022 Current exercise: housecleaning and walking Current diet habits: well balanced  Pertinent Labs: Lab Results  Component Value Date   CHOL 273 (H) 08/05/2020   HDL 40 08/05/2020   LDLCALC 185 (H) 08/05/2020   TRIG 247 (H) 08/05/2020   CHOLHDL 6.8 (H) 08/05/2020   Lab Results  Component Value Date   NA 138 08/05/2020   K 4.8 08/05/2020   CREATININE 0.62 08/05/2020   EGFR 124 08/05/2020   GFRNONAA >60 02/27/2018   GLUCOSE 149 (H) 08/05/2020       Depression, Follow-up  She  was last seen for this 3 months ago. Current treatment includes Celexa 20 mg daily  She reports excellent compliance with treatment. She is not having side effects.      She reports excellent tolerance of treatment. Current symptoms include:  none She feels she  is Improved since last visit.  Depression screen [facility] 2/9 11/13/2020 08/05/2020 05/05/2020  Decreased Interest 0 0 0  Down, Depressed, Hopeless 0 0 1  PHQ - 2 Score 0 0 1  Altered sleeping 0 0 2  Tired, decreased energy 0 0 2  Change in appetite 0 1 0  Feeling bad or failure about yourself  0 0 0  Trouble concentrating 0 0 2  Moving slowly or fidgety/restless 0 0 2  Suicidal thoughts 0 0 0  PHQ-9 Score 0 1 9  Difficult doing work/chores Not difficult at all Not difficult at all Somewhat difficult       Lipid/Cholesterol, Follow-up  Last lipid panel Other pertinent labs  Lab Results  Component Value Date   CHOL 273 (H) 08/05/2020   HDL 40 08/05/2020   LDLCALC 185 (H) 08/05/2020   TRIG 247 (H) 08/05/2020   CHOLHDL 6.8 (H) 08/05/2020   Lab Results  Component Value Date   ALT 17 08/05/2020   AST 20 08/05/2020   PLT 401 08/05/2020   TSH 1.650 04/07/2020     She was last seen for this 3 months ago.  Management since that visit includes Crestor 10 mg daily.     She reports excellent compliance with treatment. She is not having side effects.      Current exercise: housecleaning and walking    Current Outpatient  Medications on File Prior to Visit  Medication Sig Dispense Refill   citalopram (CELEXA) 10 MG tablet TAKE 3 TABLETS BY MOUTH EVERY DAY (Patient taking differently: 10 mg daily. Take with 20 mg to equal 30 mg) 270 tablet 1   citalopram (CELEXA) 20 MG tablet TAKE 1 TABLET BY MOUTH EVERY DAY 90 tablet 0   lisdexamfetamine (VYVANSE) 30 MG capsule Take 1 capsule (30 mg total) by mouth daily. 30 capsule 0   metFORMIN (GLUCOPHAGE-XR) 500 MG 24 hr tablet TAKE 2 TABLETS BY MOUTH EVERY DAY WITH BREAKFAST 180 tablet 2   rosuvastatin (CRESTOR) 10 MG tablet Take 1 tablet (10 mg total) by mouth daily. 90 tablet 1   terbinafine (LAMISIL) 250 MG tablet Take 1 tablet (250 mg total) by mouth daily. 90 tablet 0   No current facility-administered medications on file prior to visit.    Past Medical History:  Diagnosis Date   Allergy    Anxiety    Depression    Diabetes mellitus without complication ([place])    Migraine headache    Nephrolithiasis    Past Surgical History:  Procedure Laterality Date   CESAREAN SECTION MULTI-GESTATIONAL N/A 02/26/2018   Procedure: CESAREAN SECTION MULTI-GESTATIONAL;  Surgeon: Linda Hedges, DO;  Location: [place];  Service: Obstetrics;  Laterality: N/A;    Family History  Problem Relation Age of Onset   Mental illness Mother    Hypertension Maternal Grandmother    Breast cancer Maternal Grandmother    Social History   Socioeconomic History   Marital status: Married    Spouse name: Not on file   Number of children: 2   Years of education: Not on file   Highest education level: Not on file  Occupational History   Occupation: 1rst grade teacher  Tobacco Use   Smoking status: Never   Smokeless tobacco: Never  Vaping Use   Vaping Use: Never used  Substance and Sexual Activity   Alcohol use: No   Drug use: No   Sexual activity: Not Currently    Birth control/protection: None  Other Topics Concern   Not on file  Social History Narrative   Not on file   Social Determinants of Health   Financial Resource Strain: Not on file  Food Insecurity: Not on file  Transportation Needs: Not on file  Physical Activity: Not on file  Stress: Not on file  Social Connections: Not on file    Review of Systems  Constitutional:  Negative for appetite change, fatigue and unexpected weight change.  HENT:  Negative for congestion, ear pain, rhinorrhea, sinus pressure, sinus pain and tinnitus.   Eyes:  Negative for pain.  Respiratory:  Negative for cough and shortness of breath.   Cardiovascular:  Negative for chest pain, palpitations and leg swelling.  Gastrointestinal:  Negative for abdominal pain, constipation, diarrhea, nausea and vomiting.  Endocrine: Negative for cold intolerance, heat intolerance, polydipsia,  polyphagia and polyuria.  Genitourinary:  Negative for dysuria, frequency and hematuria.  Musculoskeletal:  Negative for arthralgias, back pain, joint swelling and myalgias.  Skin:  Negative for rash.  Allergic/Immunologic: Positive for environmental allergies.  Neurological:  Negative for dizziness and headaches.  Hematological:  Negative for adenopathy.  Psychiatric/Behavioral:  Negative for decreased concentration and sleep disturbance. The patient is not nervous/anxious.     Objective:  Temp (!) 96.8 F (36 C)   Ht $R'5\' 8"'fy$  (1.727 m)   Wt 242 lb (109.8 kg)   BMI 36.80 kg/m  BP 110/66  Pulse 65   Temp (!) 96.8 F (36 C)   Ht 5\' 8"  (1.727 m)   Wt 242 lb (109.8 kg)   SpO2 96%   BMI 36.80 kg/m    BP/Weight 11/13/2020 08/05/2020 5/73/2202  Systolic BP - 542 706  Diastolic BP - 72 68  Wt. (Lbs) 242 251 254  BMI 36.8 38.16 38.62    Physical Exam Vitals reviewed.  Constitutional:      Appearance: She is obese.  HENT:     Head: Normocephalic.     Right Ear: Tympanic membrane normal.     Left Ear: Tympanic membrane normal.     Nose: Nose normal.     Mouth/Throat:     Mouth: Mucous membranes are moist.  Eyes:     Pupils: Pupils are equal, round, and reactive to light.  Cardiovascular:     Rate and Rhythm: Normal rate and regular rhythm.     Pulses: Normal pulses.     Heart sounds: Normal heart sounds.  Pulmonary:     Effort: Pulmonary effort is normal.     Breath sounds: Normal breath sounds.  Abdominal:     General: Bowel sounds are normal.     Palpations: Abdomen is soft.  Skin:    General: Skin is warm and dry.     Capillary Refill: Capillary refill takes less than 2 seconds.  Neurological:     General: No focal deficit present.     Mental Status: She is alert and oriented to person, place, and time.  Psychiatric:        Mood and Affect: Mood normal.        Behavior: Behavior normal.       Lab Results  Component Value Date   WBC 10.0 08/05/2020   HGB  12.8 08/05/2020   HCT 39.2 08/05/2020   PLT 401 08/05/2020   GLUCOSE 149 (H) 08/05/2020   CHOL 273 (H) 08/05/2020   TRIG 247 (H) 08/05/2020   HDL 40 08/05/2020   LDLCALC 185 (H) 08/05/2020   ALT 17 08/05/2020   AST 20 08/05/2020   NA 138 08/05/2020   K 4.8 08/05/2020   CL 98 08/05/2020   CREATININE 0.62 08/05/2020   BUN 9 08/05/2020   CO2 26 08/05/2020   TSH 1.650 04/07/2020   HGBA1C 7.6 (H) 08/05/2020   MICROALBUR 30 04/07/2020      Assessment & Plan:    1. Type 2 diabetes mellitus with hyperglycemia, without long-term current use of insulin (HCC)-not at goal  - Semaglutide,0.25 or 0.5MG /DOS, (OZEMPIC, 0.25 OR 0.5 MG/DOSE,) 2 MG/1.5ML SOPN; Inject 0.5 mg into the skin once a week.  Dispense: 1.5 mL; Refill: 1 - Semaglutide,0.25 or 0.5MG /DOS, (OZEMPIC, 0.25 OR 0.5 MG/DOSE,) 2 MG/1.5ML SOPN; Inject 0.5 mg into the skin once a week.  Dispense: 1.5 mL; Refill: 0 - Semaglutide, 1 MG/DOSE, (OZEMPIC, 1 MG/DOSE,) 4 MG/3ML SOPN; Inject 1 mg into the skin once a week.  Dispense: 3 mL; Refill: 2 - CBC with Differential/Platelet - Comprehensive metabolic panel - Hemoglobin A1c - Lipid panel  2. Adult ADHD-well controlled -continue Vyvanse 30 mg daily  3. Nausea - ondansetron (ZOFRAN ODT) 4 MG disintegrating tablet; Take 1 tablet (4 mg total) by mouth every 8 (eight) hours as needed for nausea or vomiting.  Dispense: 30 tablet; Refill: 1  4. Mixed hyperlipidemia-not at goal - Lipid panel -continue Crestor 10 mg daily  5. Class 2 severe obesity due to excess calories with serious comorbidity and body  mass index (BMI) of 36.0 to 36.9 in adult (HCC) - CBC with Differential/Platelet - Comprehensive metabolic panel - Lipid panel  6. Depression, recurrent (HCC)-well controlled -continue Celexa 20 mg daily    Begin Ozempic 0.25 injection weekly for 4-weeks Then increase to 0.5 injection weekly for 4-weeks Then increase to 1 mg injection weekly for 4-weeks Continue  medications Take Zofran 4 mg ODT as needed for nausea Follow-up in 4-months  Follow-up: 31-months  An After Visit Summary was printed and given to the patient.  I, Rip Harbour, NP, have reviewed all documentation for this visit. The documentation on 11/13/20 for the exam, diagnosis, procedures, and orders are all accurate and complete.   Rip Harbour, NP [place] [phone]

## 2020-11-14 LAB — CBC WITH DIFFERENTIAL/PLATELET
Basophils Absolute: 0 10*3/uL (ref 0.0–0.2)
Basos: 0 %
EOS (ABSOLUTE): 0.2 10*3/uL (ref 0.0–0.4)
Eos: 2 %
Hematocrit: 39.7 % (ref 34.0–46.6)
Hemoglobin: 12.5 g/dL (ref 11.1–15.9)
Immature Grans (Abs): 0 10*3/uL (ref 0.0–0.1)
Immature Granulocytes: 0 %
Lymphocytes Absolute: 2.3 10*3/uL (ref 0.7–3.1)
Lymphs: 24 %
MCH: 26.7 pg (ref 26.6–33.0)
MCHC: 31.5 g/dL (ref 31.5–35.7)
MCV: 85 fL (ref 79–97)
Monocytes Absolute: 0.6 10*3/uL (ref 0.1–0.9)
Monocytes: 6 %
Neutrophils Absolute: 6.5 10*3/uL (ref 1.4–7.0)
Neutrophils: 68 %
Platelets: 404 10*3/uL (ref 150–450)
RBC: 4.69 x10E6/uL (ref 3.77–5.28)
RDW: 12.8 % (ref 11.7–15.4)
WBC: 9.6 10*3/uL (ref 3.4–10.8)

## 2020-11-14 LAB — COMPREHENSIVE METABOLIC PANEL
ALT: 12 IU/L (ref 0–32)
AST: 16 IU/L (ref 0–40)
Albumin/Globulin Ratio: 1.8 (ref 1.2–2.2)
Albumin: 4.2 g/dL (ref 3.9–5.0)
Alkaline Phosphatase: 93 IU/L (ref 44–121)
BUN/Creatinine Ratio: 17 (ref 9–23)
BUN: 10 mg/dL (ref 6–20)
Bilirubin Total: <0.2 mg/dL (ref 0.0–1.2)
CO2: 26 mmol/L (ref 20–29)
Calcium: 9.3 mg/dL (ref 8.7–10.2)
Chloride: 100 mmol/L (ref 96–106)
Creatinine, Ser: 0.6 mg/dL (ref 0.57–1.00)
Globulin, Total: 2.4 g/dL (ref 1.5–4.5)
Glucose: 156 mg/dL — ABNORMAL HIGH (ref 70–99)
Potassium: 4.7 mmol/L (ref 3.5–5.2)
Sodium: 138 mmol/L (ref 134–144)
Total Protein: 6.6 g/dL (ref 6.0–8.5)
eGFR: 124 mL/min/{1.73_m2} (ref >59)

## 2020-11-14 LAB — LIPID PANEL
Chol/HDL Ratio: 4.5 ratio — ABNORMAL HIGH (ref 0.0–4.4)
Cholesterol, Total: 193 mg/dL (ref 100–199)
HDL: 43 mg/dL (ref >39)
LDL Chol Calc (NIH): 130 mg/dL — ABNORMAL HIGH (ref 0–99)
Triglycerides: 110 mg/dL (ref 0–149)
VLDL Cholesterol Cal: 20 mg/dL (ref 5–40)

## 2020-11-14 LAB — HEMOGLOBIN A1C
Est. average glucose Bld gHb Est-mCnc: 174 mg/dL
Hgb A1c MFr Bld: 7.7 % — ABNORMAL HIGH (ref 4.8–5.6)

## 2020-11-14 LAB — CARDIOVASCULAR RISK ASSESSMENT

## 2020-11-30 ENCOUNTER — Other Ambulatory Visit: Payer: Self-pay | Admitting: Nurse Practitioner

## 2020-11-30 DIAGNOSIS — F418 Other specified anxiety disorders: Secondary | ICD-10-CM

## 2020-12-07 ENCOUNTER — Other Ambulatory Visit: Payer: Self-pay

## 2020-12-07 DIAGNOSIS — F909 Attention-deficit hyperactivity disorder, unspecified type: Secondary | ICD-10-CM

## 2020-12-08 ENCOUNTER — Telehealth: Payer: Self-pay

## 2020-12-08 MED ORDER — LISDEXAMFETAMINE DIMESYLATE 30 MG PO CAPS: 30.0000 mg | ORAL_CAPSULE | Freq: Every day | ORAL | 0 refills | Status: DC

## 2020-12-08 NOTE — Telephone Encounter (Signed)
Patient left voicemail stating she "Hi, I've called your office 5 times for a refill..." attempted to call patient left voicemail for patient to call back with the medication she needs refilled or she can contact her pharmacy who can send over a rx refill request.

## 2020-12-23 ENCOUNTER — Telehealth: Payer: Managed Care, Other (non HMO) | Admitting: Physician Assistant

## 2020-12-23 DIAGNOSIS — M62838 Other muscle spasm: Secondary | ICD-10-CM

## 2020-12-23 MED ORDER — NAPROXEN 500 MG PO TABS
500.0000 mg | ORAL_TABLET | Freq: Two times a day (BID) | ORAL | 0 refills | Status: DC
Start: 2020-12-23 — End: 2021-02-18

## 2020-12-23 MED ORDER — CYCLOBENZAPRINE HCL 10 MG PO TABS: 5.0000 mg | ORAL_TABLET | Freq: Three times a day (TID) | ORAL | 0 refills | Status: DC | PRN

## 2020-12-23 NOTE — Progress Notes (Signed)

## 2020-12-25 IMAGING — US USMFM FETAL BPP W/O NON-STRESS ADDL GEST
1 series · 14 of 22 positions shown · non-contrast
Comparison: none

[Series 1: usmfm fetal bpp w/o non-stress addl gest · 22 acquisitions, 14 frames shown]
[im 1/22]
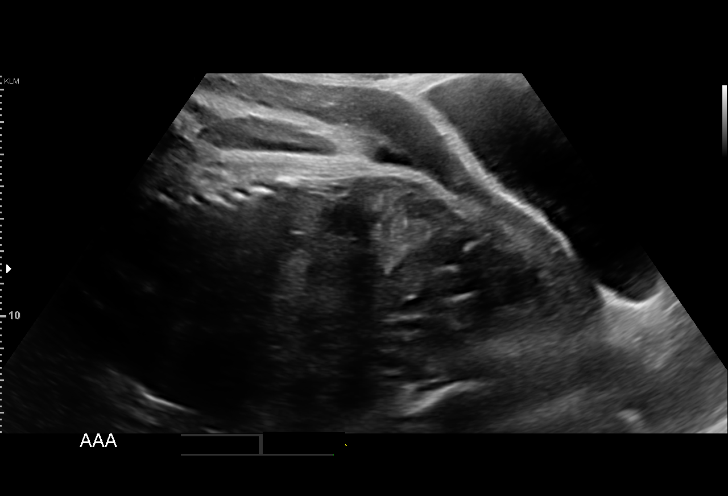
[im 3/22]
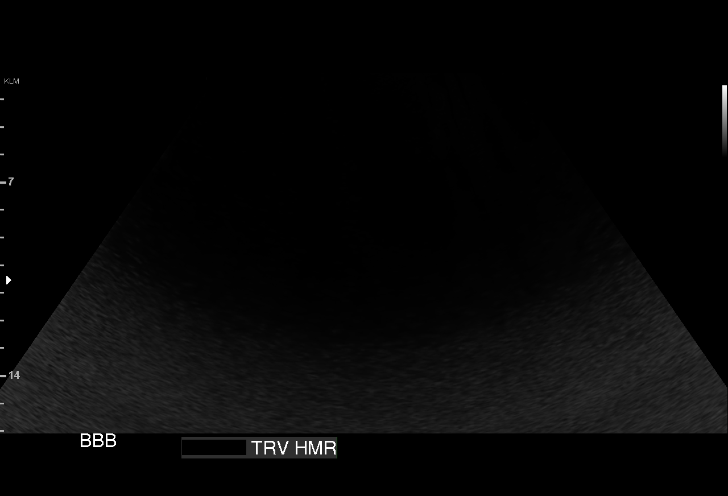
[im 4/22]
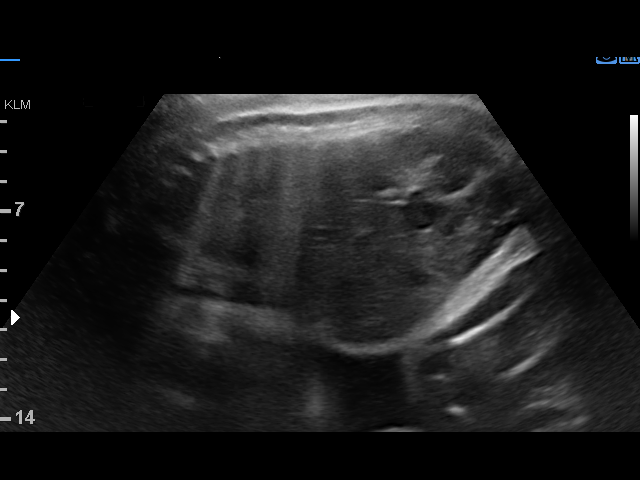
[im 6/22]
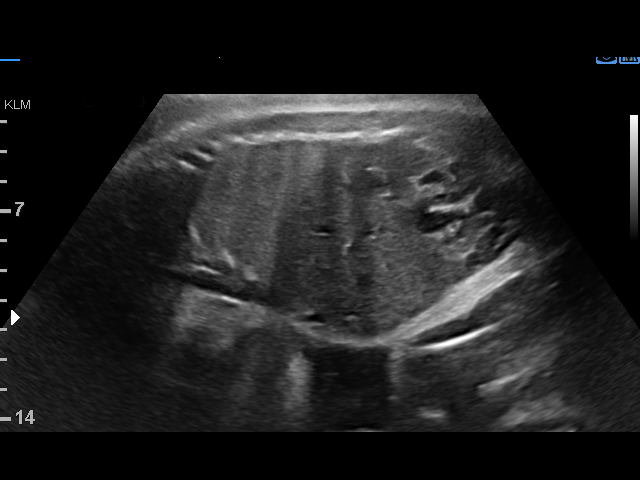
[im 8/22]
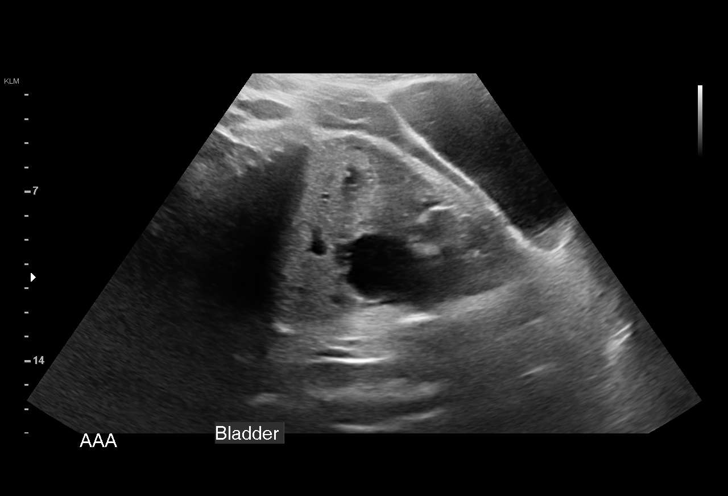
[im 9/22]
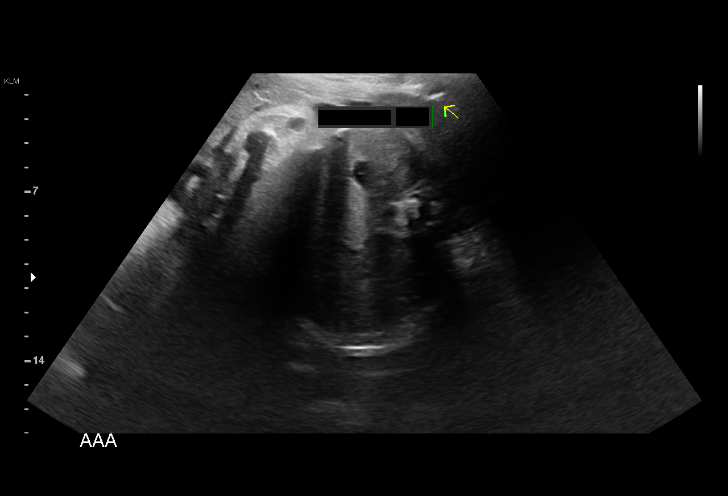
[im 11/22]
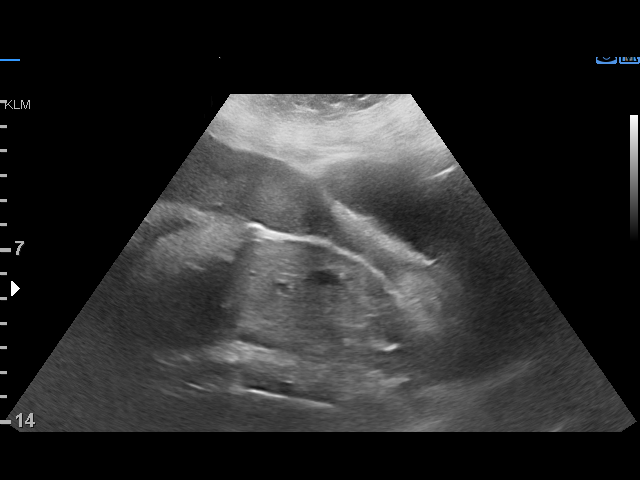
[im 12/22]
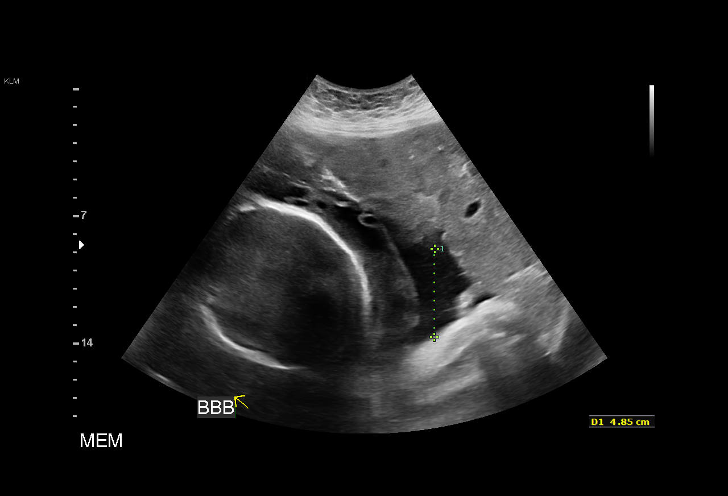
[im 14/22]
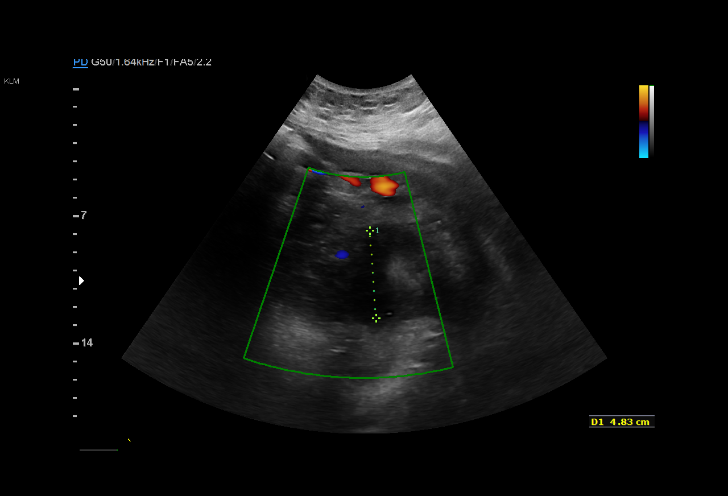
[im 15/22]
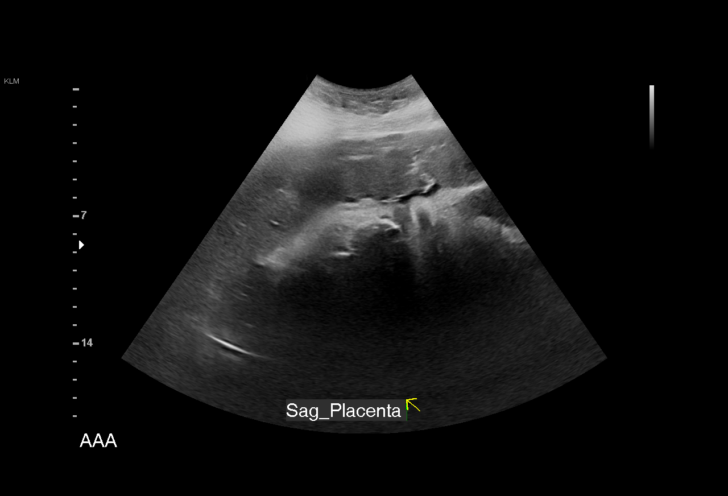
[im 17/22]
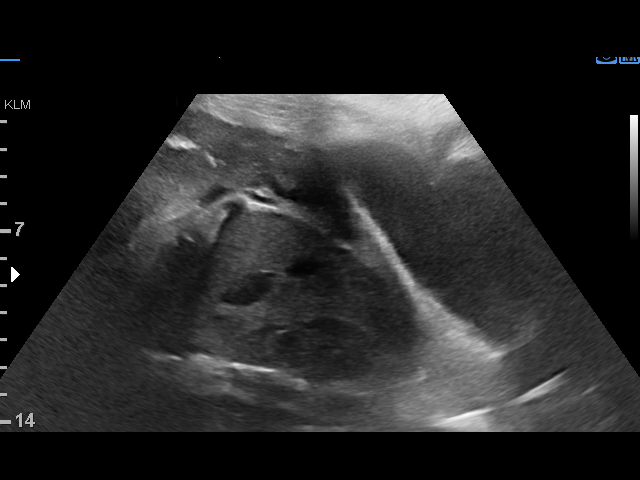
[im 19/22]
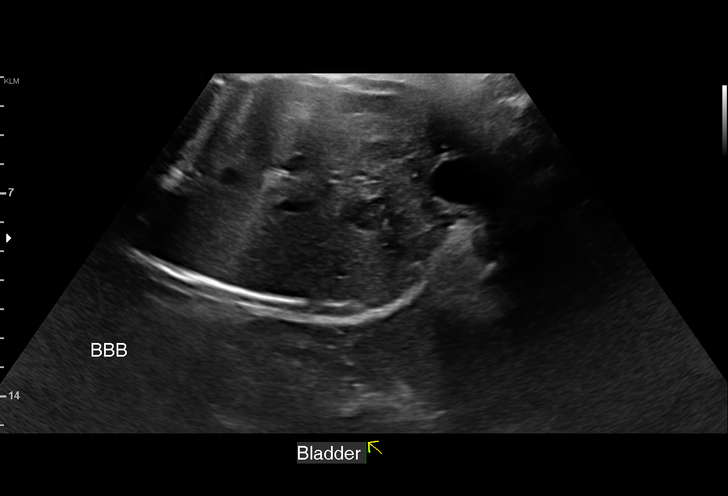
[im 20/22]
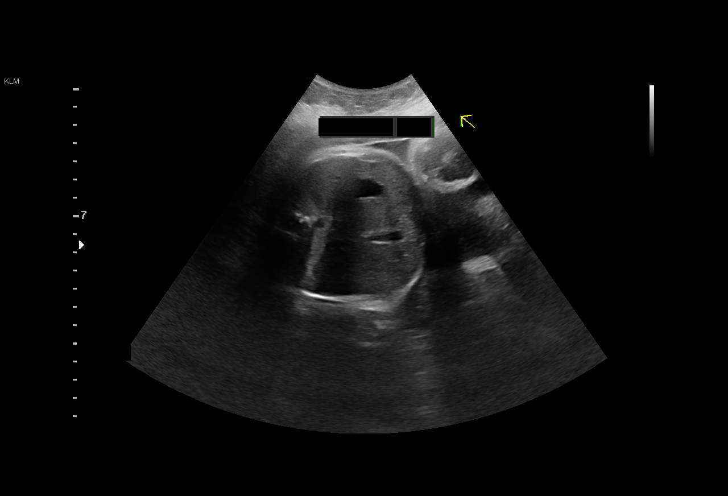
[im 22/22]
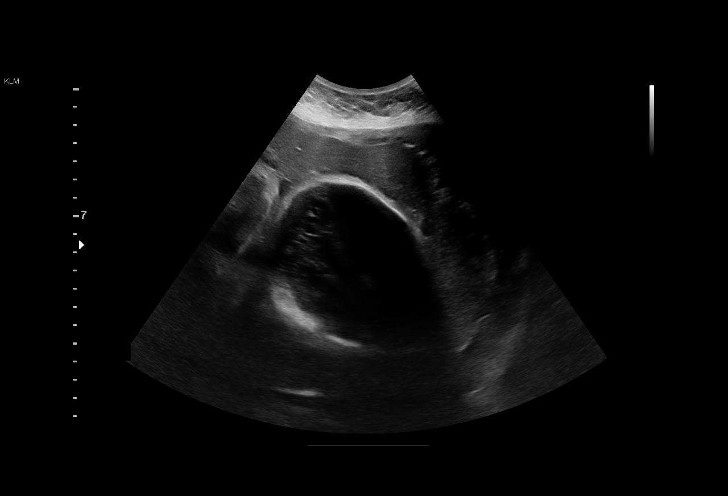

[14 of 22 positions shown; findings below may reference images not displayed]

[REDACTED]
 Attending:        SPICE        Secondary Phy.:   3rd Nursing- [employer]
                                                            OB

     ADDL GESTATION
 ----------------------------------------------------------------------

 ----------------------------------------------------------------------
Indications

  33 weeks gestation of pregnancy
  Twin pregnancy, di/di, third trimester
  Severe preeclampsia, third trimester
  Pre-existing diabetes, type 2, in pregnancy,
  third trimester (metformin)
 ----------------------------------------------------------------------
Fetal Evaluation (Fetus A)

 Num Of Fetuses:         2
 Fetal Heart Rate(bpm):  122
 Cardiac Activity:       Observed
 Fetal Lie:              Lower Fetus
 Presentation:           Breech
 Placenta:               Anterior
 Membrane Desc:      Dividing Membrane seen - Dichorionic.

 Amniotic Fluid
 AFI FV:      Within normal limits

                             Largest Pocket(cm)

Biophysical Evaluation (Fetus A)
 Amniotic F.V:   Within normal limits       F. Tone:        Observed
 F. Movement:    Observed                   Score:          [DATE]
 F. Breathing:   Not Observed
OB History

 Gravidity:    1         Term:   0        Prem:   0        SAB:   0
 TOP:          0       Ectopic:  0        Living: 0
Gestational Age (Fetus A)

 LMP:           34w 4d        Date:  06/26/17                 EDD:   04/02/18
 Best:          33w 3d     Det. By:  Early Ultrasound         EDD:   04/10/18
Anatomy (Fetus A)

 Abdomen:               Appears normal         Bladder:                Appears normal

Fetal Evaluation (Fetus B)

 Num Of Fetuses:         2
 Fetal Heart Rate(bpm):  121
 Cardiac Activity:       Observed
 Fetal Lie:              Upper Fetus
 Presentation:           Transverse, head to maternal right
 Placenta:               Anterior
 Membrane Desc:      Dividing Membrane seen - Dichorionic.

 Amniotic Fluid
 AFI FV:      Within normal limits

                             Largest Pocket(cm)

Biophysical Evaluation (Fetus B)

 Amniotic F.V:   Within normal limits       F. Tone:        Observed
 F. Movement:    Observed                   Score:          [DATE]
 F. Breathing:   Observed
Gestational Age (Fetus B)

 LMP:           34w 4d        Date:  06/26/17                 EDD:   04/02/18
 Best:          33w 3d     Det. By:  Early Ultrasound         EDD:   04/10/18
Anatomy (Fetus B)

 Stomach:               Appears normal, left   Bladder:                Appears normal
                        sided
Impression

 Dichorionic-diamniotic twin pregnancy.
 Twin A: Lower fetus, breech presentation, anterior placenta,
 female fetus. Amniotic fluid is normal and good fetal activity is
 seen. Fetal breathing movements did not meet the criteria.
 BPP [DATE] or [DATE] (including NST).
 Twin B: Upper fetus, transverse lie, anterior placenta, female
 fetus. Amniotic fluid is normal and good fetal activity is seen.
 NST).
 Consultation note in [REDACTED].
Recommendations

 -BPP on [REDACTED] (01/26/2018).
                 Alquds Alwahsh

## 2021-01-12 ENCOUNTER — Other Ambulatory Visit: Payer: Self-pay

## 2021-01-12 DIAGNOSIS — F909 Attention-deficit hyperactivity disorder, unspecified type: Secondary | ICD-10-CM

## 2021-01-12 MED ORDER — LISDEXAMFETAMINE DIMESYLATE 30 MG PO CAPS: 30.0000 mg | ORAL_CAPSULE | Freq: Every day | ORAL | 0 refills | Status: DC

## 2021-01-22 ENCOUNTER — Other Ambulatory Visit: Payer: Self-pay | Admitting: Physician Assistant

## 2021-01-22 DIAGNOSIS — M62838 Other muscle spasm: Secondary | ICD-10-CM

## 2021-01-27 ENCOUNTER — Other Ambulatory Visit: Payer: Self-pay | Admitting: Nurse Practitioner

## 2021-01-27 DIAGNOSIS — F418 Other specified anxiety disorders: Secondary | ICD-10-CM

## 2021-01-29 ENCOUNTER — Other Ambulatory Visit: Payer: Self-pay | Admitting: Nurse Practitioner

## 2021-01-29 DIAGNOSIS — R110 Nausea: Secondary | ICD-10-CM

## 2021-02-07 ENCOUNTER — Other Ambulatory Visit: Payer: Self-pay | Admitting: Nurse Practitioner

## 2021-02-07 DIAGNOSIS — F909 Attention-deficit hyperactivity disorder, unspecified type: Secondary | ICD-10-CM

## 2021-02-09 MED ORDER — LISDEXAMFETAMINE DIMESYLATE 30 MG PO CAPS: 30.0000 mg | ORAL_CAPSULE | Freq: Every day | ORAL | 0 refills | Status: DC

## 2021-02-18 NOTE — Progress Notes (Signed)
Subjective:  Patient ID: Angela Powers, female    DOB: 10-27-1990  Age: 31 y.o. MRN: [id number]  Chief Complaint  Patient presents with   Diabetes   Hyperlipidemia   Anxiety   Depression    HPI  Angela Powers is a 31 year old Caucasian female that presents for follow-up of type 2 DM, hyperlipidemia, and depression. She has lost 15 pounds since last visit. Denies any acute medical problems today. Last eye exam 74-months ago.  Depression, Follow-up  She  was last seen for this 3 months ago. Currently taking Celexa 30 mg   She reports excellent compliance with treatment. She is not having side effects.   She reports excellent tolerance of treatment. Current symptoms include: fatigue She feels she is Unchanged since last visit.  Depression screen [facility] 2/9 11/13/2020 08/05/2020 05/05/2020  Decreased Interest 0 0 0  Down, Depressed, Hopeless 0 0 1  PHQ - 2 Score 0 0 1  Altered sleeping 0 0 2  Tired, decreased energy 0 0 2  Change in appetite 0 1 0  Feeling bad or failure about yourself  0 0 0  Trouble concentrating 0 0 2  Moving slowly or fidgety/restless 0 0 2  Suicidal thoughts 0 0 0  PHQ-9 Score 0 1 9  Difficult doing work/chores Not difficult at all Not difficult at all Somewhat difficult      Diabetes Mellitus Type II, Follow-up  Lab Results  Component Value Date   HGBA1C 7.7 (H) 11/13/2020   HGBA1C 7.6 (H) 08/05/2020   HGBA1C 8.3 (H) 04/07/2020   Wt Readings from Last 3 Encounters:  11/13/20 242 lb (109.8 kg)  08/05/20 251 lb (113.9 kg)  05/05/20 254 lb (115.2 kg)   Last seen for diabetes 3 months ago.  Management since then includes Ozempic 1 mg weekly and  Metformin    She reports excellent compliance with treatment. She is not having side effects.  Symptoms: No fatigue No foot ulcerations  No appetite changes No nausea  No paresthesia of the feet  No polydipsia  No polyuria No visual disturbances   No vomiting     Home blood sugar records:  None Most Recent Eye Exam: 06/2020 Current exercise: no regular exercise Current diet habits: in general, a "healthy" diet    Pertinent Labs: Lab Results  Component Value Date   CHOL 193 11/13/2020   HDL 43 11/13/2020   LDLCALC 130 (H) 11/13/2020   TRIG 110 11/13/2020   CHOLHDL 4.5 (H) 11/13/2020   Lab Results  Component Value Date   NA 138 11/13/2020   K 4.7 11/13/2020   CREATININE 0.60 11/13/2020   EGFR 124 11/13/2020   GFRNONAA >60 02/27/2018   GLUCOSE 156 (H) 11/13/2020     Lipid/Cholesterol, Follow-up  Last lipid panel Other pertinent labs  Lab Results  Component Value Date   CHOL 193 11/13/2020   HDL 43 11/13/2020   LDLCALC 130 (H) 11/13/2020   TRIG 110 11/13/2020   CHOLHDL 4.5 (H) 11/13/2020   Lab Results  Component Value Date   ALT 12 11/13/2020   AST 16 11/13/2020   PLT 404 11/13/2020   TSH 1.650 04/07/2020     She was last seen for this 3 months ago.  Management since that visit includes Crestor .  She reports excellent compliance with treatment. She is not having side effects.      Current Outpatient Medications on File Prior to Visit  Medication Sig Dispense Refill   citalopram (CELEXA) 10 MG  tablet TAKE 3 TABLETS BY MOUTH EVERY DAY 270 tablet 1   citalopram (CELEXA) 20 MG tablet TAKE 1 TABLET BY MOUTH EVERY DAY 90 tablet 0   cyclobenzaprine (FLEXERIL) 10 MG tablet Take 0.5-1 tablets (5-10 mg total) by mouth 3 (three) times daily as needed for muscle spasms. 30 tablet 0   lisdexamfetamine (VYVANSE) 30 MG capsule Take 1 capsule (30 mg total) by mouth daily. 30 capsule 0   metFORMIN (GLUCOPHAGE-XR) 500 MG 24 hr tablet TAKE 2 TABLETS BY MOUTH EVERY DAY WITH BREAKFAST 180 tablet 2   naproxen (NAPROSYN) 500 MG tablet Take 1 tablet (500 mg total) by mouth 2 (two) times daily with a meal. 30 tablet 0   ondansetron (ZOFRAN-ODT) 4 MG disintegrating tablet TAKE 1 TABLET BY MOUTH EVERY 8 HOURS AS NEEDED FOR NAUSEA AND VOMITING 30 tablet 1   rosuvastatin  (CRESTOR) 10 MG tablet Take 1 tablet (10 mg total) by mouth daily. 90 tablet 1   Semaglutide, 1 MG/DOSE, (OZEMPIC, 1 MG/DOSE,) 4 MG/3ML SOPN Inject 1 mg into the skin once a week. 3 mL 2   Semaglutide,0.25 or 0.5MG /DOS, (OZEMPIC, 0.25 OR 0.5 MG/DOSE,) 2 MG/1.5ML SOPN Inject 0.5 mg into the skin once a week. 1.5 mL 1   Semaglutide,0.25 or 0.5MG /DOS, (OZEMPIC, 0.25 OR 0.5 MG/DOSE,) 2 MG/1.5ML SOPN Inject 0.5 mg into the skin once a week. 1.5 mL 0   terbinafine (LAMISIL) 250 MG tablet Take 1 tablet (250 mg total) by mouth daily. 90 tablet 0   No current facility-administered medications on file prior to visit.   Past Medical History:  Diagnosis Date   Allergy    Anxiety    Depression    Diabetes mellitus without complication ([place])    Migraine headache    Nephrolithiasis    Past Surgical History:  Procedure Laterality Date   CESAREAN SECTION MULTI-GESTATIONAL N/A 02/26/2018   Procedure: CESAREAN SECTION MULTI-GESTATIONAL;  Surgeon: Linda Hedges, DO;  Location: [place];  Service: Obstetrics;  Laterality: N/A;    Family History  Problem Relation Age of Onset   Mental illness Mother    Hypertension Maternal Grandmother    Breast cancer Maternal Grandmother    Social History   Socioeconomic History   Marital status: Married    Spouse name: Not on file   Number of children: 2   Years of education: Not on file   Highest education level: Not on file  Occupational History   Occupation: 1rst grade teacher  Tobacco Use   Smoking status: Never   Smokeless tobacco: Never  Vaping Use   Vaping Use: Never used  Substance and Sexual Activity   Alcohol use: No   Drug use: No   Sexual activity: Not Currently    Birth control/protection: None  Other Topics Concern   Not on file  Social History Narrative   Not on file   Social Determinants of Health   Financial Resource Strain: Not on file  Food Insecurity: Not on file  Transportation Needs: Not on file  Physical  Activity: Not on file  Stress: Not on file  Social Connections: Not on file    Review of Systems  Constitutional:  Negative for chills, fatigue and fever.  HENT:  Negative for congestion, ear pain, rhinorrhea and sore throat.   Respiratory:  Negative for cough and shortness of breath.   Cardiovascular:  Negative for chest pain.  Gastrointestinal:  Negative for abdominal pain, constipation, diarrhea, nausea and vomiting.  Genitourinary:  Negative for dysuria  and urgency.  Musculoskeletal:  Negative for back pain and myalgias.  Neurological:  Negative for dizziness, weakness, light-headedness and headaches.  Psychiatric/Behavioral:  Negative for dysphoric mood. The patient is not nervous/anxious.     Objective:  BP 130/74    Pulse 70    Temp (!) 96.9 F (36.1 C)    Ht $R'5\' 8"'to$  (1.727 m)    Wt 227 lb (103 kg)    SpO2 99%    BMI 34.52 kg/m    BP/Weight 11/13/2020 08/05/2020 4/58/0998  Systolic BP 338 250 539  Diastolic BP 66 72 68  Wt. (Lbs) 242 251 254  BMI 36.8 38.16 38.62    Physical Exam Vitals reviewed.  Constitutional:      Appearance: She is obese.  HENT:     Head: Normocephalic.     Right Ear: Tympanic membrane normal.     Left Ear: Tympanic membrane normal.     Nose: Nose normal.     Mouth/Throat:     Mouth: Mucous membranes are moist.  Eyes:     Pupils: Pupils are equal, round, and reactive to light.     Comments: Eye glasses in place  Cardiovascular:     Rate and Rhythm: Normal rate and regular rhythm.     Pulses: Normal pulses.          Dorsalis pedis pulses are 2+ on the left side.       Posterior tibial pulses are 2+ on the right side and 2+ on the left side.     Heart sounds: Normal heart sounds.  Pulmonary:     Effort: Pulmonary effort is normal.     Breath sounds: Normal breath sounds.  Abdominal:     General: Bowel sounds are normal.     Palpations: Abdomen is soft.  Musculoskeletal:        General: Normal range of motion.     Cervical back: Neck  supple.  Feet:     Right foot:     Protective Sensation: 8 sites tested.  8 sites sensed.     Skin integrity: Skin integrity normal.     Toenail Condition: Right toenails are normal.     Left foot:     Protective Sensation: 8 sites tested.  8 sites sensed.     Skin integrity: Skin integrity normal.     Toenail Condition: Left toenails are normal.  Skin:    General: Skin is warm and dry.     Capillary Refill: Capillary refill takes less than 2 seconds.  Neurological:     General: No focal deficit present.     Mental Status: She is alert and oriented to person, place, and time.  Psychiatric:        Mood and Affect: Mood normal.        Behavior: Behavior normal.        Lab Results  Component Value Date   WBC 9.6 11/13/2020   HGB 12.5 11/13/2020   HCT 39.7 11/13/2020   PLT 404 11/13/2020   GLUCOSE 156 (H) 11/13/2020   CHOL 193 11/13/2020   TRIG 110 11/13/2020   HDL 43 11/13/2020   LDLCALC 130 (H) 11/13/2020   ALT 12 11/13/2020   AST 16 11/13/2020   NA 138 11/13/2020   K 4.7 11/13/2020   CL 100 11/13/2020   CREATININE 0.60 11/13/2020   BUN 10 11/13/2020   CO2 26 11/13/2020   TSH 1.650 04/07/2020   HGBA1C 7.7 (H) 11/13/2020   MICROALBUR 30  04/07/2020      Assessment & Plan:   1. Type 2 diabetes mellitus with hyperglycemia, without long-term current use of insulin (HCC)-well controlled - CBC with Differential/Platelet - Hemoglobin A1c - Microalbumin / creatinine urine ratio - Vitamin D, 25-hydroxy  2. Adult ADHD-well controlled -continue Vyvanse 30 mg daily  3. Mixed hyperlipidemia-not at goal - Comprehensive metabolic panel - Lipid panel -Continue Crestor 10 mg daily -heart healthy low fat diet -continue physical activity daily  4. Anxiety with depression-well controlled -continue Celexa 30 mg daily  5. Other fatigue - Vitamin D, 25-hydroxy   6.Class 1 obesity with serious comorbidity with BMI 34.0-34.9 -CBC -CMP -HgbA1C -Vit D,  25-hydroxy -Lipid panel    Continue medications We will call you with lab results Follow-up in 3-months, pap smear   Follow-up: 23-months, fasting; will do pap-smear next visit  An After Visit Summary was printed and given to the patient.  I, Rip Harbour, NP, have reviewed all documentation for this visit. The documentation on 02/19/21 for the exam, diagnosis, procedures, and orders are all accurate and complete.    Signed, Rip Harbour, NP [place] [phone]

## 2021-02-19 ENCOUNTER — Other Ambulatory Visit: Payer: Self-pay

## 2021-02-19 ENCOUNTER — Ambulatory Visit: Payer: Managed Care, Other (non HMO) | Admitting: Nurse Practitioner

## 2021-02-19 ENCOUNTER — Encounter: Payer: Self-pay | Admitting: Nurse Practitioner

## 2021-02-19 VITALS — BP 130/74 | HR 70 | Temp 96.9°F | Ht 68.0 in | Wt 227.0 lb

## 2021-02-19 DIAGNOSIS — Z6834 Body mass index (BMI) 34.0-34.9, adult: Secondary | ICD-10-CM

## 2021-02-19 DIAGNOSIS — F909 Attention-deficit hyperactivity disorder, unspecified type: Secondary | ICD-10-CM

## 2021-02-19 DIAGNOSIS — R5383 Other fatigue: Secondary | ICD-10-CM

## 2021-02-19 DIAGNOSIS — E1165 Type 2 diabetes mellitus with hyperglycemia: Secondary | ICD-10-CM

## 2021-02-19 DIAGNOSIS — E6609 Other obesity due to excess calories: Secondary | ICD-10-CM

## 2021-02-19 DIAGNOSIS — E782 Mixed hyperlipidemia: Secondary | ICD-10-CM

## 2021-02-19 DIAGNOSIS — F418 Other specified anxiety disorders: Secondary | ICD-10-CM | POA: Diagnosis not present

## 2021-02-19 NOTE — Patient Instructions (Addendum)
Continue medications We will call you with lab results Follow-up in 63-months, pap smear   Preventive Care 71-31 Years Old, Female Preventive care refers to lifestyle choices and visits with your health care provider that can promote health and wellness. Preventive care visits are also called wellness exams. What can I expect for my preventive care visit? Counseling During your preventive care visit, your health care provider may ask about your: Medical history, including: Past medical problems. Family medical history. Pregnancy history. Current health, including: Menstrual cycle. Method of birth control. Emotional well-being. Home life and relationship well-being. Sexual activity and sexual health. Lifestyle, including: Alcohol, nicotine or tobacco, and drug use. Access to firearms. Diet, exercise, and sleep habits. Work and work Statistician. Sunscreen use. Safety issues such as seatbelt and bike helmet use. Physical exam Your health care provider may check your: Height and weight. These may be used to calculate your BMI (body mass index). BMI is a measurement that tells if you are at a healthy weight. Waist circumference. This measures the distance around your waistline. This measurement also tells if you are at a healthy weight and may help predict your risk of certain diseases, such as type 2 diabetes and high blood pressure. Heart rate and blood pressure. Body temperature. Skin for abnormal spots. What immunizations do I need? Vaccines are usually given at various ages, according to a schedule. Your health care provider will recommend vaccines for you based on your age, medical history, and lifestyle or other factors, such as travel or where you work. What tests do I need? Screening Your health care provider may recommend screening tests for certain conditions. This may include: Pelvic exam and Pap test. Lipid and cholesterol levels. Diabetes screening. This is done by  checking your blood sugar (glucose) after you have not eaten for a while (fasting). Hepatitis B test. Hepatitis C test. HIV (human immunodeficiency virus) test. STI (sexually transmitted infection) testing, if you are at risk. BRCA-related cancer screening. This may be done if you have a family history of breast, ovarian, tubal, or peritoneal cancers. Talk with your health care provider about your test results, treatment options, and if necessary, the need for more tests. Follow these instructions at home: Eating and drinking  Eat a healthy diet that includes fresh fruits and vegetables, whole grains, lean protein, and low-fat dairy products. Take vitamin and mineral supplements as recommended by your health care provider. Do not drink alcohol if: Your health care provider tells you not to drink. You are pregnant, may be pregnant, or are planning to become pregnant. If you drink alcohol: Limit how much you have to 0-1 drink a day. Know how much alcohol is in your drink. In the [place] one drink equals one 12 oz bottle of beer (355 mL), one 5 oz glass of wine (148 mL), or one 1 oz glass of hard liquor (44 mL). Lifestyle Brush your teeth every morning and night with fluoride toothpaste. Floss one time each day. Exercise for at least 30 minutes 5 or more days each week. Do not use any products that contain nicotine or tobacco. These products include cigarettes, chewing tobacco, and vaping devices, such as e-cigarettes. If you need help quitting, ask your health care provider. Do not use drugs. If you are sexually active, practice safe sex. Use a condom or other form of protection to prevent STIs. If you do not wish to become pregnant, use a form of birth control. If you plan to become pregnant, see your health  care provider for a prepregnancy visit. Find healthy ways to manage stress, such as: Meditation, yoga, or listening to music. Journaling. Talking to a trusted person. Spending time  with friends and family. Minimize exposure to UV radiation to reduce your risk of skin cancer. Safety Always wear your seat belt while driving or riding in a vehicle. Do not drive: If you have been drinking alcohol. Do not ride with someone who has been drinking. If you have been using any mind-altering substances or drugs. While texting. When you are tired or distracted. Wear a helmet and other protective equipment during sports activities. If you have firearms in your house, make sure you follow all gun safety procedures. Seek help if you have been physically or sexually abused. What's next? Go to your health care provider once a year for an annual wellness visit. Ask your health care provider how often you should have your eyes and teeth checked. Stay up to date on all vaccines. This information is not intended to replace advice given to you by your health care provider. Make sure you discuss any questions you have with your health care provider. Document Revised: 06/24/2020 Document Reviewed: 06/24/2020 Elsevier Patient Education  2022 [place].    Preventing Vitamin D Deficiency Vitamin D is a nutrient that helps your body absorb calcium from food. It plays a key role in the health of bones and teeth, muscle function, and infection prevention. Our bodies make vitamin D when our skin is exposed to direct sunlight. However, for many people, this may not be enough vitamin D to meet the body's needs. When you get too little vitamin D, it is called a deficiency. How can this condition affect me? A vitamin D deficiency can put you at risk of developing conditions that cause bones to be brittle, such as rickets or osteoporosis. If you are over age 6, not having enough vitamin D may weaken your muscles and bones and increase your risk for falls and broken bones. What can increase my risk? You may be at risk for a vitamin D deficiency if you: Are pregnant. Are obese. Are over 46  years old. Have dark skin. Take certain medicines that affect the way vitamin D is absorbed. Have had gastric bypass surgery. Other risk factors include: Having a condition that limits your ability to absorb fat, such as cystic fibrosis, celiac disease, or inflammatory bowel disease. Having certain inherited conditions. Not having access to foods rich in vitamin D. Having limited ability to move. Living in areas that have fewer hours of sunlight. Spending most of your day indoors, or you cover your skin all the time when you are outdoors. Breastfed infants are also at risk for vitamin D deficiency. What actions can I take to reduce my risk of a vitamin D deficiency? Knowing the best sources of vitamin D You can meet your daily vitamin D needs from: Foods. Dietary supplements. Direct exposure to natural sunlight. Infant formula (for babies). Knowing how much vitamin D you need General recommendations for daily vitamin D intake vary by these categories: Infants: 400 International Units. Children over 11 year old: 600 International Units. Adults: 600 International Units. Pregnant and breastfeeding women: 600 International Units. Adults over 68 years old: 30 International Units. These are minimum levels of recommended amounts. Your health care provider may recommend a different amount of vitamin D intake based on your specific needs and your overall health. Getting sun exposure Get regular, safe exposure to natural sunlight. Expose your skin  to direct sunlight for at least 15 minutes every day. If you have dark skin, you may need to expose your skin for a longer period of time. Protect your skin from too much sun exposure. This helps to prevent skin cancer. Ask your health care provider if regular sun exposure is safe for you. Do not use a tanning bed. Eating and drinking  Eat foods that naturally contain vitamin D. These include: Beef liver. Egg yolk. Fatty fish, such as cod,  salmon, trout, swordfish, shrimp, sardines, and tuna. Cheese. Mushrooms. Oysters. Eat or drink products that have been fortified with vitamin D. Fortified means that vitamin D has been added to the food. These may include: Cereals. Dairy products, such as milk, yogurt, butter, or margarine. Orange juice. Alternative milks, such as soy milk or almond milk. When choosing foods, check the food label on the package to see: How much vitamin D is in the item. If the food is fortified with vitamin D. Although it is hard to get your vitamin D requirement from foods alone, you should eat a balanced diet each day that includes foods naturally higher in vitamin D or fortified with it. Try to include the following in your diet each day: 2-3 servings of meat or meat alternatives. 2-3 servings of dairy. Taking supplements If you are at risk for vitamin D deficiency, or if you have certain diseases, your health care provider may recommend that you take a vitamin D supplement. Make sure you: Talk with your health care provider before you start taking any vitamin D supplements. You may be more sensitive to the side effects of vitamin D supplements if you are on certain medicines or have certain medical conditions. Tell your health care provider about all medicines you are taking, including vitamin, mineral, and herbal supplements. Take medicines and supplements only as told by your health care provider. Summary Vitamin D is a nutrient that helps your body absorb calcium from food. A vitamin D deficiency can put you at risk of developing conditions that cause bones to be brittle, such as rickets or osteoporosis. Our bodies make vitamin D when our skin is exposed to direct sunlight. However, for many people, this may not be enough vitamin D to meet the body's needs. Some foods naturally contain vitamin D, including beef liver, egg yolk, and fatty fish. Products may also be fortified with vitamin D. Fortified  means that vitamin D has been added to the food. This information is not intended to replace advice given to you by your health care provider. Make sure you discuss any questions you have with your health care provider. Document Revised: 09/19/2018 Document Reviewed: 12/22/2017 Elsevier Patient Education  2022 [employer].

## 2021-02-20 LAB — COMPREHENSIVE METABOLIC PANEL
ALT: 11 IU/L (ref 0–32)
AST: 16 IU/L (ref 0–40)
Albumin/Globulin Ratio: 1.7 (ref 1.2–2.2)
Albumin: 4.4 g/dL (ref 3.9–5.0)
Alkaline Phosphatase: 71 IU/L (ref 44–121)
BUN/Creatinine Ratio: 23 (ref 9–23)
BUN: 15 mg/dL (ref 6–20)
Bilirubin Total: 0.3 mg/dL (ref 0.0–1.2)
CO2: 22 mmol/L (ref 20–29)
Calcium: 9.5 mg/dL (ref 8.7–10.2)
Chloride: 101 mmol/L (ref 96–106)
Creatinine, Ser: 0.66 mg/dL (ref 0.57–1.00)
Globulin, Total: 2.6 g/dL (ref 1.5–4.5)
Glucose: 113 mg/dL — ABNORMAL HIGH (ref 70–99)
Potassium: 4.7 mmol/L (ref 3.5–5.2)
Sodium: 138 mmol/L (ref 134–144)
Total Protein: 7 g/dL (ref 6.0–8.5)
eGFR: 121 mL/min/{1.73_m2} (ref >59)

## 2021-02-20 LAB — CBC WITH DIFFERENTIAL/PLATELET
Basophils Absolute: 0 10*3/uL (ref 0.0–0.2)
Basos: 0 %
EOS (ABSOLUTE): 0.3 10*3/uL (ref 0.0–0.4)
Eos: 3 %
Hematocrit: 38 % (ref 34.0–46.6)
Hemoglobin: 12.5 g/dL (ref 11.1–15.9)
Immature Grans (Abs): 0 10*3/uL (ref 0.0–0.1)
Immature Granulocytes: 0 %
Lymphocytes Absolute: 2.5 10*3/uL (ref 0.7–3.1)
Lymphs: 25 %
MCH: 27.4 pg (ref 26.6–33.0)
MCHC: 32.9 g/dL (ref 31.5–35.7)
MCV: 83 fL (ref 79–97)
Monocytes Absolute: 0.5 10*3/uL (ref 0.1–0.9)
Monocytes: 5 %
Neutrophils Absolute: 6.5 10*3/uL (ref 1.4–7.0)
Neutrophils: 67 %
Platelets: 431 10*3/uL (ref 150–450)
RBC: 4.57 x10E6/uL (ref 3.77–5.28)
RDW: 13.7 % (ref 11.7–15.4)
WBC: 9.8 10*3/uL (ref 3.4–10.8)

## 2021-02-20 LAB — MICROALBUMIN / CREATININE URINE RATIO
Creatinine, Urine: 95.9 mg/dL
Microalb/Creat Ratio: 7 mg/g creat (ref 0–29)
Microalbumin, Urine: 6.5 ug/mL

## 2021-02-20 LAB — VITAMIN D 25 HYDROXY (VIT D DEFICIENCY, FRACTURES): Vit D, 25-Hydroxy: 10.4 ng/mL — ABNORMAL LOW (ref 30.0–100.0)

## 2021-02-20 LAB — LIPID PANEL
Chol/HDL Ratio: 3.3 ratio (ref 0.0–4.4)
Cholesterol, Total: 167 mg/dL (ref 100–199)
HDL: 50 mg/dL (ref >39)
LDL Chol Calc (NIH): 97 mg/dL (ref 0–99)
Triglycerides: 111 mg/dL (ref 0–149)
VLDL Cholesterol Cal: 20 mg/dL (ref 5–40)

## 2021-02-20 LAB — HEMOGLOBIN A1C
Est. average glucose Bld gHb Est-mCnc: 131 mg/dL
Hgb A1c MFr Bld: 6.2 % — ABNORMAL HIGH (ref 4.8–5.6)

## 2021-02-20 LAB — CARDIOVASCULAR RISK ASSESSMENT

## 2021-02-21 ENCOUNTER — Other Ambulatory Visit: Payer: Self-pay | Admitting: Nurse Practitioner

## 2021-02-21 DIAGNOSIS — E782 Mixed hyperlipidemia: Secondary | ICD-10-CM

## 2021-02-21 DIAGNOSIS — E1165 Type 2 diabetes mellitus with hyperglycemia: Secondary | ICD-10-CM

## 2021-02-22 ENCOUNTER — Other Ambulatory Visit: Payer: Self-pay

## 2021-02-22 MED ORDER — VITAMIN D (ERGOCALCIFEROL) 1.25 MG (50000 UNIT) PO CAPS: 50000.0000 [IU] | ORAL_CAPSULE | ORAL | 0 refills | Status: DC

## 2021-02-23 ENCOUNTER — Other Ambulatory Visit: Payer: Self-pay

## 2021-02-23 MED ORDER — SEMAGLUTIDE (1 MG/DOSE) 4 MG/3ML ~~LOC~~ SOPN: 1.0000 mg | PEN_INJECTOR | SUBCUTANEOUS | 0 refills | Status: DC

## 2021-03-07 ENCOUNTER — Telehealth: Payer: Managed Care, Other (non HMO) | Admitting: Emergency Medicine

## 2021-03-07 ENCOUNTER — Encounter: Payer: Self-pay | Admitting: Emergency Medicine

## 2021-03-07 ENCOUNTER — Other Ambulatory Visit: Payer: Self-pay | Admitting: Nurse Practitioner

## 2021-03-07 DIAGNOSIS — M5440 Lumbago with sciatica, unspecified side: Secondary | ICD-10-CM | POA: Diagnosis not present

## 2021-03-07 DIAGNOSIS — F909 Attention-deficit hyperactivity disorder, unspecified type: Secondary | ICD-10-CM

## 2021-03-07 MED ORDER — PREDNISONE 10 MG (21) PO TBPK: ORAL_TABLET | Freq: Every day | ORAL | 0 refills | Status: DC

## 2021-03-07 MED ORDER — CYCLOBENZAPRINE HCL 10 MG PO TABS: 10.0000 mg | ORAL_TABLET | Freq: Every day | ORAL | 0 refills | Status: DC

## 2021-03-07 NOTE — Progress Notes (Signed)
I have spent 5 minutes in review of e-visit questionnaire, review and updating patient chart, medical decision making and response to patient.   Agnieszka Newhouse, PA-C    

## 2021-03-07 NOTE — Progress Notes (Signed)
We are sorry that you are not feeling well.  Here is how we plan to help!  Long term treatment for this problem would have to be addressed with a back specialist.  You would maybe benefit from physical therapy, back injections, and/or surgery.  Please make an appointment with a back specialist for further evaluation and management if this problem is reoccurring.    Based on what you have shared with me it looks like you mostly have acute back pain.  Acute back pain is defined as musculoskeletal pain that can resolve in 1-3 weeks with conservative treatment.  I have prescribed prednisone steroid pak as well as Flexeril 10 mg every eight hours as needed which is a muscle relaxer  Some patients experience stomach irritation or in increased heartburn with anti-inflammatory drugs.  Please keep in mind that muscle relaxer's can cause fatigue and should not be taken while at work or driving.  Back pain is very common.  The pain often gets better over time.  The cause of back pain is usually not dangerous.  Most people can learn to manage their back pain on their own.  Home Care Stay active.  Start with short walks on flat ground if you can.  Try to walk farther each day. Do not sit, drive or stand in one place for more than 30 minutes.  Do not stay in bed. Do not avoid exercise or work.  Activity can help your back heal faster. Be careful when you bend or lift an object.  Bend at your knees, keep the object close to you, and do not twist. Sleep on a firm mattress.  Lie on your side, and bend your knees.  If you lie on your back, put a pillow under your knees. Only take medicines as told by your doctor. Put ice on the injured area. Put ice in a plastic bag Place a towel between your skin and the bag Leave the ice on for 15-20 minutes, 3-4 times a day for the first 2-3 days. 210 After that, you can switch between ice and heat packs. Ask your doctor about back exercises or massage. Avoid feeling anxious  or stressed.  Find good ways to deal with stress, such as exercise.  Get Help Right Way If: Your pain does not go away with rest or medicine. Your pain does not go away in 1 week. You have new problems. You do not feel well. The pain spreads into your legs. You cannot control when you poop (bowel movement) or pee (urinate) You feel sick to your stomach (nauseous) or throw up (vomit) You have belly (abdominal) pain. You feel like you may pass out (faint). If you develop a fever.  Make Sure you: Understand these instructions. Will watch your condition Will get help right away if you are not doing well or get worse.  Your e-visit answers were reviewed by a board certified advanced clinical practitioner to complete your personal care plan.  Depending on the condition, your plan could have included both over the counter or prescription medications.  If there is a problem please reply  once you have received a response from your provider.  Your safety is important to [place].  If you have drug allergies check your prescription carefully.    You can use MyChart to ask questions about todays visit, request a non-urgent call back, or ask for a work or school excuse for 24 hours related to this e-Visit. If it has been greater than  24 hours you will need to follow up with your provider, or enter a new e-Visit to address those concerns.  You will get an e-mail in the next two days asking about your experience.  I hope that your e-visit has been valuable and will speed your recovery. Thank you for using e-visits.

## 2021-03-08 MED ORDER — LISDEXAMFETAMINE DIMESYLATE 30 MG PO CAPS: 30.0000 mg | ORAL_CAPSULE | Freq: Every day | ORAL | 0 refills | Status: DC

## 2021-03-17 ENCOUNTER — Encounter: Payer: Self-pay | Admitting: Nurse Practitioner

## 2021-03-17 ENCOUNTER — Other Ambulatory Visit: Payer: Self-pay | Admitting: Nurse Practitioner

## 2021-03-17 DIAGNOSIS — M5440 Lumbago with sciatica, unspecified side: Secondary | ICD-10-CM

## 2021-03-17 MED ORDER — CYCLOBENZAPRINE HCL 10 MG PO TABS: 10.0000 mg | ORAL_TABLET | Freq: Every day | ORAL | 0 refills | Status: DC

## 2021-03-19 ENCOUNTER — Other Ambulatory Visit: Payer: Self-pay | Admitting: Nurse Practitioner

## 2021-03-19 DIAGNOSIS — R110 Nausea: Secondary | ICD-10-CM

## 2021-04-02 ENCOUNTER — Other Ambulatory Visit: Payer: Self-pay | Admitting: Nurse Practitioner

## 2021-04-02 DIAGNOSIS — M5440 Lumbago with sciatica, unspecified side: Secondary | ICD-10-CM

## 2021-04-07 ENCOUNTER — Telehealth: Payer: Managed Care, Other (non HMO) | Admitting: Urgent Care

## 2021-04-07 DIAGNOSIS — R399 Unspecified symptoms and signs involving the genitourinary system: Secondary | ICD-10-CM

## 2021-04-07 NOTE — Progress Notes (Signed)
Based on the information that you have shared in the e-Visit Questionnaire, we recommend that you convert this visit to a video visit in order for the provider to better assess what is going on.  The provider will be able to give you a more accurate diagnosis and treatment plan if we can more freely discuss your symptoms and with the addition of a virtual examination.   If you convert to a video visit, we will bill your insurance (similar to an office visit) and you will not be charged for this e-Visit. You will be able to stay at home and speak with the first available [facility] advanced practice provider. The link to do a video visit is in the drop down Menu tab of your Welcome screen in MyChart.   

## 2021-04-13 ENCOUNTER — Other Ambulatory Visit: Payer: Self-pay | Admitting: Nurse Practitioner

## 2021-04-13 DIAGNOSIS — F909 Attention-deficit hyperactivity disorder, unspecified type: Secondary | ICD-10-CM

## 2021-04-14 ENCOUNTER — Telehealth: Payer: Managed Care, Other (non HMO)

## 2021-04-14 ENCOUNTER — Telehealth: Payer: Managed Care, Other (non HMO) | Admitting: Physician Assistant

## 2021-04-14 ENCOUNTER — Encounter: Payer: Self-pay | Admitting: Nurse Practitioner

## 2021-04-14 DIAGNOSIS — M5442 Lumbago with sciatica, left side: Secondary | ICD-10-CM

## 2021-04-14 MED ORDER — LISDEXAMFETAMINE DIMESYLATE 30 MG PO CAPS: 30.0000 mg | ORAL_CAPSULE | Freq: Every day | ORAL | 0 refills | Status: DC

## 2021-04-14 NOTE — Progress Notes (Signed)
Based on what you shared with me, I feel your condition warrants further evaluation and I recommend that you be seen for a face to face visit.  Please contact your primary care physician practice to be seen. Many offices offer virtual options to be seen via video if you are not comfortable going in person to a medical facility at this time. ? ?Since this has been a long standing issue for over 4 weeks and has not truly responded to medications, we highly recommend for you to have an in person visit. It is possible you may have a herniated disc more so than just muscle spasms and inflammation.  ? ?NOTE: You will NOT be charged for this eVisit. ? ?If you do not have a PCP, [facility] offers a free physician referral service available at [phone]. Our trained staff has the experience, knowledge and resources to put you in touch with a physician who is right for you.  ? ?If you are having a true medical emergency please call 911.   ?  ? For an urgent face to face visit, [facility] has six urgent care centers for your convenience:  ?  ? [facility] ?Get Driving Directions ?[phone] ?[phone] ?[place], [place] [address] ?  ? [facility]) ?Get Driving Directions ?[phone] ?[address] ?[place], [place] [address] ? ?[facility]) ?Get Driving Directions ?[phone] ?[address] ?[place],  [place]  [address] ? ?[facility] ?Get Driving Directions ?[phone] ?[address], [address] ?[place], [place] [address] ?  ? Urgent Care at [facility] [place] ?Get Driving Directions  ?[phone] ?[address] ?[address] ?[place], [place] [address] ?  ? Urgent Care at [facility] ?Get Driving Directions ?[phone] ?[address] ?[place], [place] [address] ? ?Your e-visit answers were reviewed by a board  certified advanced clinical practitioner to complete your personal care plan.  Thank you for using e-Visits. ? ? ?I provided 5 minutes of non face-to-face time during this encounter for chart review and documentation.  ? ?

## 2021-04-29 ENCOUNTER — Other Ambulatory Visit: Payer: Self-pay | Admitting: Nurse Practitioner

## 2021-04-29 DIAGNOSIS — F418 Other specified anxiety disorders: Secondary | ICD-10-CM

## 2021-04-29 DIAGNOSIS — E1165 Type 2 diabetes mellitus with hyperglycemia: Secondary | ICD-10-CM

## 2021-05-02 ENCOUNTER — Other Ambulatory Visit: Payer: Self-pay | Admitting: Nurse Practitioner

## 2021-05-09 ENCOUNTER — Other Ambulatory Visit: Payer: Self-pay | Admitting: Nurse Practitioner

## 2021-05-16 ENCOUNTER — Other Ambulatory Visit: Payer: Self-pay | Admitting: Nurse Practitioner

## 2021-05-16 DIAGNOSIS — F909 Attention-deficit hyperactivity disorder, unspecified type: Secondary | ICD-10-CM

## 2021-05-17 MED ORDER — LISDEXAMFETAMINE DIMESYLATE 30 MG PO CAPS: 30.0000 mg | ORAL_CAPSULE | Freq: Every day | ORAL | 0 refills | Status: DC

## 2021-05-19 NOTE — Progress Notes (Signed)
? ?Subjective:  ?Patient ID: Angela Powers, female    DOB: [birth year]  Age: 31 y.o. MRN: [id number] ? ?Chief Complaint  ?Patient presents with  ? Annual Exam  ? ? ?HPI ?Encounter for general adult medical examination without abnormal findings. Current menses, will defer pap smear per pt request  ?Physical ("At Risk" items are starred): Patient's last physical exam was 1 year ago .  ? ?   ?SDOH Screenings  ? ?Alcohol Screen: Low Risk   ? Last Alcohol Screening Score (AUDIT): 0  ?Depression (PHQ2-9): Low Risk   ? PHQ-2 Score: 0  ?Financial Resource Strain: Not on file  ?Food Insecurity: Not on file  ?Housing: Not on file  ?Physical Activity: Not on file  ?Social Connections: Not on file  ?Stress: Not on file  ?Tobacco Use: Low Risk   ? Smoking Tobacco Use: Never  ? Smokeless Tobacco Use: Never  ? Passive Exposure: Not on file  ?Transportation Needs: Not on file  ?  ? ?  11/13/2020  ?  7:57 AM 05/05/2020  ?  3:28 PM  ?Fall Risk   ?Falls in the past year? 0 0  ?Number falls in past yr: 0 0  ?Injury with Fall? 0 0  ?Risk for fall due to : No Fall Risks No Fall Risks  ?Follow up Falls evaluation completed Falls evaluation completed  ?  ? ?  05/20/2021  ? 10:03 AM 02/19/2021  ?  7:55 AM 11/13/2020  ?  7:57 AM 08/05/2020  ? 11:18 AM 05/05/2020  ?  3:28 PM  ?Depression screen PHQ 2/9  ?Decreased Interest 0 0 0 0 0  ?Down, Depressed, Hopeless 0 0 0 0 1  ?PHQ - 2 Score 0 0 0 0 1  ?Altered sleeping 0 3 0 0 2  ?Tired, decreased energy 0 0 0 0 2  ?Change in appetite 0 0 0 1 0  ?Feeling bad or failure about yourself  0 0 0 0 0  ?Trouble concentrating 0 0 0 0 2  ?Moving slowly or fidgety/restless 0 0 0 0 2  ?Suicidal thoughts 0 0 0 0 0  ?PHQ-9 Score 0 3 0 1 9  ?Difficult doing work/chores Not difficult at all Not difficult at all Not difficult at all Not difficult at all Somewhat difficult  ?  ?Functional Status Survey: ?   ? ?Safety: reviewed ;  ?Patient wears a seat belt. ?Patient's home has smoke detectors and carbon  monoxide detectors. ?Patient practices appropriate gun safety ?Patient wears sunscreen with extended sun exposure. ?Dental Care: biannual cleanings, brushes and flosses daily. ?Ophthalmology/Optometry: Annual visit.  ?Hearing loss: none ?Vision impairments: none ?Patient is not afflicted from Stress Incontinence and Urge Incontinence  ? ?Current Outpatient Medications on File Prior to Visit  ?Medication Sig Dispense Refill  ? citalopram (CELEXA) 20 MG tablet TAKE 1 TABLET BY MOUTH EVERY DAY 90 tablet 1  ? lisdexamfetamine (VYVANSE) 30 MG capsule Take 1 capsule (30 mg total) by mouth daily. 30 capsule 0  ? metFORMIN (GLUCOPHAGE-XR) 500 MG 24 hr tablet TAKE 2 TABLETS BY MOUTH EVERY DAY WITH BREAKFAST 180 tablet 2  ? ondansetron (ZOFRAN-ODT) 4 MG disintegrating tablet TAKE 1 TABLET BY MOUTH EVERY 8 HOURS AS NEEDED FOR NAUSEA AND VOMITING 30 tablet 1  ? OZEMPIC, 1 MG/DOSE, 4 MG/3ML SOPN INJECT 1 MG ONCE A WEEK AS DIRECTED 9 mL 1  ? rosuvastatin (CRESTOR) 10 MG tablet TAKE 1 TABLET BY MOUTH EVERY DAY 90 tablet 1  ? Vitamin  D, Ergocalciferol, (DRISDOL) 1.25 MG (50000 UNIT) CAPS capsule TAKE 1 CAPSULE (50,000 UNITS TOTAL) BY MOUTH TWO TIMES A WEEK 24 capsule 0  ? ?No current facility-administered medications on file prior to visit.  ?  ?Social Hx   ?Social History  ? ?Socioeconomic History  ? Marital status: Married  ?  Spouse name: Not on file  ? Number of children: 2  ? Years of education: Not on file  ? Highest education level: Not on file  ?Occupational History  ? Occupation: 1rst grade teacher  ?Tobacco Use  ? Smoking status: Never  ? Smokeless tobacco: Never  ?Vaping Use  ? Vaping Use: Never used  ?Substance and Sexual Activity  ? Alcohol use: No  ? Drug use: No  ? Sexual activity: Not Currently  ?  Birth control/protection: None  ?Other Topics Concern  ? Not on file  ?Social History Narrative  ? Not on file  ? ?Social Determinants of Health  ? ?Financial Resource Strain: Not on file  ?Food Insecurity: Not on file   ?Transportation Needs: Not on file  ?Physical Activity: Not on file  ?Stress: Not on file  ?Social Connections: Not on file  ? ?Past Medical History:  ?Diagnosis Date  ? Allergy   ? Anxiety   ? Depression   ? Diabetes mellitus without complication ([facility])   ? Migraine headache   ? Nephrolithiasis   ? ?Family History  ?Problem Relation Age of Onset  ? Mental illness Mother   ? Hypertension Maternal Grandmother   ? Breast cancer Maternal Grandmother   ? ? ?Review of Systems  ?Constitutional:  Negative for chills, fatigue and fever.  ?HENT:  Negative for congestion, ear pain, rhinorrhea and sore throat.   ?Respiratory:  Negative for cough and shortness of breath.   ?Cardiovascular:  Negative for chest pain.  ?Gastrointestinal:  Negative for abdominal pain, constipation, diarrhea, nausea and vomiting.  ?Genitourinary:  Negative for dysuria and urgency.  ?Musculoskeletal:  Negative for back pain and myalgias.  ?Neurological:  Negative for dizziness, weakness, light-headedness and headaches.  ?Psychiatric/Behavioral:  Negative for dysphoric mood. The patient is not nervous/anxious.   ? ? ?Objective:  ?BP 122/78 (BP Location: Left Arm, Patient Position: Sitting)   Pulse 76   Temp (!) 97.3 ?F (36.3 ?C) (Temporal)   Ht 5\' 8"  (1.727 m)   Wt 226 lb (102.5 kg)   SpO2 96%   BMI 34.36 kg/m?  ? ? ?  05/20/2021  ? 10:03 AM 02/19/2021  ?  7:52 AM 11/13/2020  ?  7:53 AM  ?BP/Weight  ?Systolic BP [id number]  ?Diastolic BP 78 74 66  ?Wt. (Lbs) 226 227 242  ?BMI 34.36 kg/m2 34.52 kg/m2 36.8 kg/m2  ? ? ?Physical Exam ?Vitals reviewed.  ?Constitutional:   ?   Appearance: Normal appearance.  ?HENT:  ?   Head: Normocephalic.  ?   Right Ear: Tympanic membrane normal.  ?   Left Ear: Tympanic membrane normal.  ?   Nose: Nose normal.  ?   Mouth/Throat:  ?   Mouth: Mucous membranes are moist.  ?Eyes:  ?   Pupils: Pupils are equal, round, and reactive to light.  ?Cardiovascular:  ?   Rate and Rhythm: Normal rate and regular rhythm.  ?    Pulses: Normal pulses.  ?   Heart sounds: Normal heart sounds.  ?Pulmonary:  ?   Effort: Pulmonary effort is normal.  ?   Breath sounds: Normal breath sounds.  ?Abdominal:  ?  General: Bowel sounds are normal.  ?   Palpations: Abdomen is soft.  ?Musculoskeletal:     ?   General: Normal range of motion.  ?   Cervical back: Neck supple.  ?Skin: ?   General: Skin is warm and dry.  ?   Capillary Refill: Capillary refill takes less than 2 seconds.  ?Neurological:  ?   General: No focal deficit present.  ?   Mental Status: She is alert and oriented to person, place, and time.  ?Psychiatric:     ?   Mood and Affect: Mood normal.     ?   Behavior: Behavior normal.  ? ? ?Lab Results  ?Component Value Date  ? WBC 9.8 02/19/2021  ? HGB 12.5 02/19/2021  ? HCT 38.0 02/19/2021  ? PLT 431 02/19/2021  ? GLUCOSE 113 (H) 02/19/2021  ? CHOL 167 02/19/2021  ? TRIG 111 02/19/2021  ? HDL 50 02/19/2021  ? [place] 97 02/19/2021  ? ALT 11 02/19/2021  ? AST 16 02/19/2021  ? NA 138 02/19/2021  ? K 4.7 02/19/2021  ? CL 101 02/19/2021  ? CREATININE 0.66 02/19/2021  ? BUN 15 02/19/2021  ? CO2 22 02/19/2021  ? TSH 1.650 04/07/2020  ? HGBA1C 6.2 (H) 02/19/2021  ? MICROALBUR 30 04/07/2020  ? ? ? ? ?Assessment & Plan:  ?  ?1. Routine medical exam ?- CBC with Differential/Platelet ?- Comprehensive metabolic panel ?- TSH ? ?2. Need for pneumococcal 20-valent conjugate vaccination ?- Pneumococcal conjugate vaccine 20-valent (Prevnar 20) ?  ? ? ?These are the goals we discussed: ? Goals   ?Weight loss ?  ?  ? ?This is a list of the screening recommended for you and due dates:  ?Health Maintenance  ?Topic Date Due  ? COVID-19 Vaccine (1) Never done  ? Pap Smear  Never done  ? Hemoglobin A1C  08/19/2021  ? Eye exam for diabetics  09/09/2021  ? Complete foot exam   02/19/2022  ? Urine Protein Check  02/19/2022  ? Tetanus Vaccine  11/25/2027  ? HPV Vaccine  Aged Out  ? Flu Shot  Discontinued  ? Hepatitis C Screening: USPSTF Recommendation to screen - Ages  29-79 yo.  Discontinued  ? HIV Screening  Discontinued  ?  ? ? ?AN INDIVIDUALIZED CARE PLAN: was established or reinforced today.   ?SELF MANAGEMENT: The patient and I together assessed ways to personally work towards o

## 2021-05-20 ENCOUNTER — Encounter: Payer: Self-pay | Admitting: Nurse Practitioner

## 2021-05-20 ENCOUNTER — Ambulatory Visit: Payer: Managed Care, Other (non HMO) | Admitting: Nurse Practitioner

## 2021-05-20 VITALS — BP 122/78 | HR 76 | Temp 97.3°F | Ht 68.0 in | Wt 226.0 lb

## 2021-05-20 DIAGNOSIS — Z124 Encounter for screening for malignant neoplasm of cervix: Secondary | ICD-10-CM

## 2021-05-20 DIAGNOSIS — Z23 Encounter for immunization: Secondary | ICD-10-CM

## 2021-05-20 DIAGNOSIS — Z0000 Encounter for general adult medical examination without abnormal findings: Secondary | ICD-10-CM

## 2021-05-20 NOTE — Patient Instructions (Addendum)
We will call you with lab results ?Pneumo-20 vaccine given to prevent pneumonia ?Continue medications ?Follow-up in 32-months and pap smear ? ?Pneumococcal Conjugate Vaccine (Prevnar 20) Suspension for Injection ?What is this medication? ?PNEUMOCOCCAL VACCINE (NEU mo KOK al vak SEEN) is a vaccine. It prevents pneumococcus bacterial infections. These bacteria can cause serious infections like pneumonia, meningitis, and blood infections. This vaccine will not treat an infection and will not cause infection. This vaccine is recommended for adults 18 years and older. ?This medicine may be used for other purposes; ask your health care provider or pharmacist if you have questions. ?COMMON BRAND NAME(S): Prevnar 20 ?What should I tell my care team before I take this medication? ?They need to know if you have any of these conditions: ?bleeding disorder ?fever ?immune system problems ?an unusual or allergic reaction to pneumococcal vaccine, diphtheria toxoid, other vaccines, other medicines, foods, dyes, or preservatives ?pregnant or trying to get pregnant ?breast-feeding ?How should I use this medication? ?This vaccine is injected into a muscle. It is given by a health care provider. ?A copy of Vaccine Information Statements will be given before each vaccination. Be sure to read this information carefully each time. This sheet may change often. ?Talk to your health care provider about the use of this medicine in children. Special care may be needed. ?Overdosage: If you think you have taken too much of this medicine contact a poison control center or emergency room at once. ?NOTE: This medicine is only for you. Do not share this medicine with others. ?What if I miss a dose? ?This does not apply. This medicine is not for regular use. ?What may interact with this medication? ?medicines for cancer chemotherapy ?medicines that suppress your immune function ?steroid medicines like prednisone or cortisone ?This list may not  describe all possible interactions. Give your health care provider a list of all the medicines, herbs, non-prescription drugs, or dietary supplements you use. Also tell them if you smoke, drink alcohol, or use illegal drugs. Some items may interact with your medicine. ?What should I watch for while using this medication? ?Mild fever and pain should go away in 3 days or less. Report any unusual symptoms to your health care provider. ?What side effects may I notice from receiving this medication? ?Side effects that you should report to your doctor or health care professional as soon as possible: ?allergic reactions (skin rash, itching or hives; swelling of the face, lips, or tongue) ?confusion ?fast, irregular heartbeat ?fever over 102 degrees F ?muscle weakness ?seizures ?trouble breathing ?unusual bruising or bleeding ?Side effects that usually do not require medical attention (report to your doctor or health care professional if they continue or are bothersome): ?fever of 102 degrees F or less ?headache ?joint pain ?muscle cramps, pain ?pain, tender at site where injected ?This list may not describe all possible side effects. Call your doctor for medical advice about side effects. You may report side effects to FDA at 1-800-FDA-1088. ?Where should I keep my medication? ?This vaccine is only given by a health care provider. It will not be stored at home. ?NOTE: This sheet is a summary. It may not cover all possible information. If you have questions about this medicine, talk to your doctor, pharmacist, or health care provider. ?? 2023 Elsevier/Gold Standard (2019-09-12 00:00:00) ? ?Preventive Care 36-72 Years Old, Female ?Preventive care refers to lifestyle choices and visits with your health care provider that can promote health and wellness. Preventive care visits are also called wellness exams. ?  What can I expect for my preventive care visit? ?Counseling ?During your preventive care visit, your health care  provider may ask about your: ?Medical history, including: ?Past medical problems. ?Family medical history. ?Pregnancy history. ?Current health, including: ?Menstrual cycle. ?Method of birth control. ?Emotional well-being. ?Home life and relationship well-being. ?Sexual activity and sexual health. ?Lifestyle, including: ?Alcohol, nicotine or tobacco, and drug use. ?Access to firearms. ?Diet, exercise, and sleep habits. ?Work and work Statistician. ?Sunscreen use. ?Safety issues such as seatbelt and bike helmet use. ?Physical exam ?Your health care provider may check your: ?Height and weight. These may be used to calculate your BMI (body mass index). BMI is a measurement that tells if you are at a healthy weight. ?Waist circumference. This measures the distance around your waistline. This measurement also tells if you are at a healthy weight and may help predict your risk of certain diseases, such as type 2 diabetes and high blood pressure. ?Heart rate and blood pressure. ?Body temperature. ?Skin for abnormal spots. ?What immunizations do I need? ? ?Vaccines are usually given at various ages, according to a schedule. Your health care provider will recommend vaccines for you based on your age, medical history, and lifestyle or other factors, such as travel or where you work. ?What tests do I need? ?Screening ?Your health care provider may recommend screening tests for certain conditions. This may include: ?Pelvic exam and Pap test. ?Lipid and cholesterol levels. ?Diabetes screening. This is done by checking your blood sugar (glucose) after you have not eaten for a while (fasting). ?Hepatitis B test. ?Hepatitis C test. ?HIV (human immunodeficiency virus) test. ?STI (sexually transmitted infection) testing, if you are at risk. ?BRCA-related cancer screening. This may be done if you have a family history of breast, ovarian, tubal, or peritoneal cancers. ?Talk with your health care provider about your test results,  treatment options, and if necessary, the need for more tests. ?Follow these instructions at home: ?Eating and drinking ? ?Eat a healthy diet that includes fresh fruits and vegetables, whole grains, lean protein, and low-fat dairy products. ?Take vitamin and mineral supplements as recommended by your health care provider. ?Do not drink alcohol if: ?Your health care provider tells you not to drink. ?You are pregnant, may be pregnant, or are planning to become pregnant. ?If you drink alcohol: ?Limit how much you have to 0-1 drink a day. ?Know how much alcohol is in your drink. In the [place] one drink equals one 12 oz bottle of beer (355 mL), one 5 oz glass of wine (148 mL), or one 1? oz glass of hard liquor (44 mL). ?Lifestyle ?Brush your teeth every morning and night with fluoride toothpaste. Floss one time each day. ?Exercise for at least 30 minutes 5 or more days each week. ?Do not use any products that contain nicotine or tobacco. These products include cigarettes, chewing tobacco, and vaping devices, such as e-cigarettes. If you need help quitting, ask your health care provider. ?Do not use drugs. ?If you are sexually active, practice safe sex. Use a condom or other form of protection to prevent STIs. ?If you do not wish to become pregnant, use a form of birth control. If you plan to become pregnant, see your health care provider for a prepregnancy visit. ?Find healthy ways to manage stress, such as: ?Meditation, yoga, or listening to music. ?Journaling. ?Talking to a trusted person. ?Spending time with friends and family. ?Minimize exposure to UV radiation to reduce your risk of skin cancer. ?Safety ?Always wear  your seat belt while driving or riding in a vehicle. ?Do not drive: ?If you have been drinking alcohol. Do not ride with someone who has been drinking. ?If you have been using any mind-altering substances or drugs. ?While texting. ?When you are tired or distracted. ?Wear a helmet and other protective  equipment during sports activities. ?If you have firearms in your house, make sure you follow all gun safety procedures. ?Seek help if you have been physically or sexually abused. ?What's next? ?Go to your health care provider

## 2021-05-21 LAB — COMPREHENSIVE METABOLIC PANEL
ALT: 13 IU/L (ref 0–32)
AST: 18 IU/L (ref 0–40)
Albumin/Globulin Ratio: 1.4 (ref 1.2–2.2)
Albumin: 4 g/dL (ref 3.9–5.0)
Alkaline Phosphatase: 85 IU/L (ref 44–121)
BUN/Creatinine Ratio: 16 (ref 9–23)
BUN: 11 mg/dL (ref 6–20)
Bilirubin Total: 0.3 mg/dL (ref 0.0–1.2)
CO2: 25 mmol/L (ref 20–29)
Calcium: 9.3 mg/dL (ref 8.7–10.2)
Chloride: 101 mmol/L (ref 96–106)
Creatinine, Ser: 0.7 mg/dL (ref 0.57–1.00)
Globulin, Total: 2.9 g/dL (ref 1.5–4.5)
Glucose: 90 mg/dL (ref 70–99)
Potassium: 4.3 mmol/L (ref 3.5–5.2)
Sodium: 139 mmol/L (ref 134–144)
Total Protein: 6.9 g/dL (ref 6.0–8.5)
eGFR: 119 mL/min/{1.73_m2} (ref >59)

## 2021-05-21 LAB — CBC WITH DIFFERENTIAL/PLATELET
Basophils Absolute: 0 10*3/uL (ref 0.0–0.2)
Basos: 0 %
EOS (ABSOLUTE): 0.1 10*3/uL (ref 0.0–0.4)
Eos: 1 %
Hematocrit: 40.2 % (ref 34.0–46.6)
Hemoglobin: 12.9 g/dL (ref 11.1–15.9)
Immature Grans (Abs): 0 10*3/uL (ref 0.0–0.1)
Immature Granulocytes: 0 %
Lymphocytes Absolute: 2.4 10*3/uL (ref 0.7–3.1)
Lymphs: 25 %
MCH: 27.4 pg (ref 26.6–33.0)
MCHC: 32.1 g/dL (ref 31.5–35.7)
MCV: 86 fL (ref 79–97)
Monocytes Absolute: 0.5 10*3/uL (ref 0.1–0.9)
Monocytes: 5 %
Neutrophils Absolute: 6.4 10*3/uL (ref 1.4–7.0)
Neutrophils: 69 %
Platelets: 467 10*3/uL — ABNORMAL HIGH (ref 150–450)
RBC: 4.7 x10E6/uL (ref 3.77–5.28)
RDW: 13.5 % (ref 11.7–15.4)
WBC: 9.3 10*3/uL (ref 3.4–10.8)

## 2021-05-21 LAB — TSH: TSH: 0.749 u[IU]/mL (ref 0.450–4.500)

## 2021-06-13 ENCOUNTER — Other Ambulatory Visit: Payer: Self-pay | Admitting: Nurse Practitioner

## 2021-06-13 DIAGNOSIS — F909 Attention-deficit hyperactivity disorder, unspecified type: Secondary | ICD-10-CM

## 2021-06-16 MED ORDER — LISDEXAMFETAMINE DIMESYLATE 30 MG PO CAPS: 30.0000 mg | ORAL_CAPSULE | Freq: Every day | ORAL | 0 refills | Status: DC

## 2021-06-25 ENCOUNTER — Other Ambulatory Visit: Payer: Self-pay | Admitting: Nurse Practitioner

## 2021-06-25 DIAGNOSIS — R110 Nausea: Secondary | ICD-10-CM

## 2021-07-17 ENCOUNTER — Other Ambulatory Visit: Payer: Self-pay | Admitting: Nurse Practitioner

## 2021-07-17 DIAGNOSIS — F909 Attention-deficit hyperactivity disorder, unspecified type: Secondary | ICD-10-CM

## 2021-07-19 MED ORDER — LISDEXAMFETAMINE DIMESYLATE 30 MG PO CAPS: 30.0000 mg | ORAL_CAPSULE | Freq: Every day | ORAL | 0 refills | Status: DC

## 2021-07-24 ENCOUNTER — Other Ambulatory Visit: Payer: Self-pay | Admitting: Nurse Practitioner

## 2021-07-27 ENCOUNTER — Telehealth: Payer: Self-pay

## 2021-07-27 NOTE — Telephone Encounter (Signed)
I left a message on the number(s) listed in the patients chart requesting the patient to call back regarding the upcomming appointment for 08/24/2021. The provider is out of the office that day. The appointment has been canceled. Waiting for the patient to return the call.

## 2021-08-13 ENCOUNTER — Other Ambulatory Visit: Payer: Self-pay | Admitting: Nurse Practitioner

## 2021-08-13 DIAGNOSIS — F909 Attention-deficit hyperactivity disorder, unspecified type: Secondary | ICD-10-CM

## 2021-08-13 DIAGNOSIS — F418 Other specified anxiety disorders: Secondary | ICD-10-CM

## 2021-08-15 ENCOUNTER — Other Ambulatory Visit: Payer: Self-pay | Admitting: Nurse Practitioner

## 2021-08-15 DIAGNOSIS — F909 Attention-deficit hyperactivity disorder, unspecified type: Secondary | ICD-10-CM

## 2021-08-16 MED ORDER — LISDEXAMFETAMINE DIMESYLATE 30 MG PO CAPS: 30.0000 mg | ORAL_CAPSULE | Freq: Every day | ORAL | 0 refills | Status: DC

## 2021-08-24 ENCOUNTER — Ambulatory Visit: Payer: Managed Care, Other (non HMO) | Admitting: Nurse Practitioner

## 2021-08-30 ENCOUNTER — Other Ambulatory Visit: Payer: Self-pay | Admitting: Nurse Practitioner

## 2021-08-30 DIAGNOSIS — R110 Nausea: Secondary | ICD-10-CM

## 2021-09-16 ENCOUNTER — Ambulatory Visit: Payer: Managed Care, Other (non HMO) | Admitting: Nurse Practitioner

## 2021-09-19 ENCOUNTER — Other Ambulatory Visit: Payer: Self-pay | Admitting: Nurse Practitioner

## 2021-09-19 DIAGNOSIS — F909 Attention-deficit hyperactivity disorder, unspecified type: Secondary | ICD-10-CM

## 2021-09-20 ENCOUNTER — Other Ambulatory Visit: Payer: Self-pay | Admitting: Nurse Practitioner

## 2021-09-20 DIAGNOSIS — F909 Attention-deficit hyperactivity disorder, unspecified type: Secondary | ICD-10-CM

## 2021-09-20 MED ORDER — LISDEXAMFETAMINE DIMESYLATE 30 MG PO CAPS: 30.0000 mg | ORAL_CAPSULE | Freq: Every day | ORAL | 0 refills | Status: DC

## 2021-09-21 ENCOUNTER — Encounter: Payer: Self-pay | Admitting: Nurse Practitioner

## 2021-09-27 ENCOUNTER — Other Ambulatory Visit: Payer: Self-pay | Admitting: Nurse Practitioner

## 2021-09-27 ENCOUNTER — Encounter: Payer: Self-pay | Admitting: Nurse Practitioner

## 2021-09-27 ENCOUNTER — Ambulatory Visit: Payer: BC Managed Care – PPO | Admitting: Nurse Practitioner

## 2021-09-27 VITALS — BP 116/74 | HR 67 | Temp 97.2°F | Ht 68.0 in | Wt 218.0 lb

## 2021-09-27 DIAGNOSIS — E782 Mixed hyperlipidemia: Secondary | ICD-10-CM | POA: Diagnosis not present

## 2021-09-27 DIAGNOSIS — F418 Other specified anxiety disorders: Secondary | ICD-10-CM | POA: Diagnosis not present

## 2021-09-27 DIAGNOSIS — R5383 Other fatigue: Secondary | ICD-10-CM | POA: Diagnosis not present

## 2021-09-27 DIAGNOSIS — F909 Attention-deficit hyperactivity disorder, unspecified type: Secondary | ICD-10-CM

## 2021-09-27 DIAGNOSIS — E1165 Type 2 diabetes mellitus with hyperglycemia: Secondary | ICD-10-CM

## 2021-09-27 DIAGNOSIS — R110 Nausea: Secondary | ICD-10-CM

## 2021-09-27 DIAGNOSIS — E538 Deficiency of other specified B group vitamins: Secondary | ICD-10-CM

## 2021-09-27 MED ORDER — CYANOCOBALAMIN 1000 MCG/ML IJ SOLN: 1000.0000 ug | INTRAMUSCULAR | 2 refills | Status: DC

## 2021-09-27 MED ORDER — SEMAGLUTIDE (2 MG/DOSE) 8 MG/3ML ~~LOC~~ SOPN: 2.0000 mg | PEN_INJECTOR | SUBCUTANEOUS | 1 refills | Status: DC

## 2021-09-27 MED ORDER — METHYLPHENIDATE HCL ER (CD) 30 MG PO CPCR: 30.0000 mg | ORAL_CAPSULE | ORAL | 0 refills | Status: DC

## 2021-09-27 MED ORDER — ONDANSETRON 4 MG PO TBDP: ORAL_TABLET | ORAL | 1 refills | Status: DC

## 2021-09-27 NOTE — Progress Notes (Signed)
Subjective:  Patient ID: Angela Powers, female    DOB: 1990-10-11  Age: 31 y.o. MRN: [id number]  CC: Type 2 DM Hyperlipidemia ADHD  HPI  Pt presents for follow-up of type 2 DM, hyperlipidemia, and ADHD. She tells me she is experiencing mild sore throat today. Denies fever, chills, or rash. Pt states she has chronic allergic rhinitis. She is not currently taking any allergy medications.   Diabetes Mellitus Type II, Follow-up  Lab Results  Component Value Date   HGBA1C 6.2 (H) 02/19/2021   HGBA1C 7.7 (H) 11/13/2020   HGBA1C 7.6 (H) 08/05/2020   Wt Readings from Last 3 Encounters:  09/27/21 218 lb (98.9 kg)  05/20/21 226 lb (102.5 kg)  02/19/21 227 lb (103 kg)   Last seen for diabetes 7 months ago.  Management  includes Metformin and Ozempic. She reports excellent compliance with treatment. She is not having side effects.   Most Recent Eye Exam: Need appt but is UTD Current exercise: none Current diet habits: in general, a "healthy" diet    Pertinent Labs: Lab Results  Component Value Date   CHOL 167 02/19/2021   HDL 50 02/19/2021   LDLCALC 97 02/19/2021   TRIG 111 02/19/2021   CHOLHDL 3.3 02/19/2021   Lab Results  Component Value Date   NA 139 05/20/2021   K 4.3 05/20/2021   CREATININE 0.70 05/20/2021   EGFR 119 05/20/2021   GFRNONAA >60 02/27/2018   GLUCOSE 90 05/20/2021      Lipid/Cholesterol, Follow-up  Last lipid panel Other pertinent labs  Lab Results  Component Value Date   CHOL 167 02/19/2021   HDL 50 02/19/2021   LDLCALC 97 02/19/2021   TRIG 111 02/19/2021   CHOLHDL 3.3 02/19/2021   Lab Results  Component Value Date   ALT 13 05/20/2021   AST 18 05/20/2021   PLT 467 (H) 05/20/2021   TSH 0.749 05/20/2021     Management includes Crestor.  She reports excellent compliance with treatment. She is not having side effects. =   Depression, Follow-up  Current treatment includes Citalopram.  She reports excellent compliance  with treatment. She is not having side effects.          She reports excellent tolerance of treatment. Current symptoms include: fatigue She feels she is Improved since last visit.     09/27/2021    3:23 PM 05/20/2021   10:03 AM 02/19/2021    7:55 AM  Depression screen PHQ 2/9  Decreased Interest 0 0 0  Down, Depressed, Hopeless 0 0 0  PHQ - 2 Score 0 0 0  Altered sleeping 0 0 3  Tired, decreased energy 3 0 0  Change in appetite 0 0 0  Feeling bad or failure about yourself  0 0 0  Trouble concentrating 1 0 0  Moving slowly or fidgety/restless 0 0 0  Suicidal thoughts 0 0 0  PHQ-9 Score 4 0 3  Difficult doing work/chores Not difficult at all Not difficult at all Not difficult at all     Anxiety, Follow-up  Current treatment includes Citalopram. She reports excellent compliance with treatment. She reports excellent tolerance of treatment. She is not having side effects.  She feels her anxiety is mild and Unchanged since last visit.   GAD-7 Results    09/27/2021    3:24 PM 02/19/2021    7:56 AM 08/05/2020   11:31 AM  GAD-7 Generalized Anxiety Disorder Screening Tool  1. Feeling Nervous, Anxious, or on Edge 0  0 0  2. Not Being Able to Stop or Control Worrying 0 0 0  3. Worrying Too Much About Different Things 0 0 0  4. Trouble Relaxing 0 0 1  5. Being So Restless it's Hard To Sit Still 0 0 0  6. Becoming Easily Annoyed or Irritable 0 0 0  7. Feeling Afraid As If Something Awful Might Happen 0 0 0  Total GAD-7 Score 0 0 1  Difficulty At Work, Home, or Getting  Along With Others? Not difficult at all Not difficult at all Not difficult at all    PHQ-9 Scores    09/27/2021    3:23 PM 05/20/2021   10:03 AM 02/19/2021    7:55 AM  PHQ9 SCORE ONLY  PHQ-9 Total Score 4 0 3    Vitamin D deficiency, follow-up  Lab Results  Component Value Date   VD25OH 10.4 (L) 02/19/2021   CALCIUM 9.3 05/20/2021   CALCIUM 9.5 02/19/2021   Wt Readings from Last 3 Encounters:  09/27/21  218 lb (98.9 kg)  05/20/21 226 lb (102.5 kg)  02/19/21 227 lb (103 kg)    Management since that visit includes Vitamin D 50,000 units weekly. She reports excellent compliance with treatment. She is not having side effects.    ADHD, follow-up: Pt has a history of ADHD for several years. Current treatment includes Vyvanse 30 mg QD. She tells me she is having difficulty obtaining medication due to supply chain issues. She has requested an alternative medication.   Current Outpatient Medications on File Prior to Visit  Medication Sig Dispense Refill   citalopram (CELEXA) 20 MG tablet TAKE 1 TABLET BY MOUTH EVERY DAY 90 tablet 1   lisdexamfetamine (VYVANSE) 30 MG capsule Take 1 capsule (30 mg total) by mouth daily. 30 capsule 0   metFORMIN (GLUCOPHAGE-XR) 500 MG 24 hr tablet TAKE 2 TABLETS BY MOUTH EVERY DAY WITH BREAKFAST 180 tablet 2   ondansetron (ZOFRAN-ODT) 4 MG disintegrating tablet TAKE 1 TABLET BY MOUTH EVERY 8 HOURS AS NEEDED FOR NAUSEA AND VOMITING 30 tablet 1   OZEMPIC, 1 MG/DOSE, 4 MG/3ML SOPN INJECT 1 MG ONCE A WEEK AS DIRECTED 3 mL 1   rosuvastatin (CRESTOR) 10 MG tablet TAKE 1 TABLET BY MOUTH EVERY DAY 90 tablet 1   Vitamin D, Ergocalciferol, (DRISDOL) 1.25 MG (50000 UNIT) CAPS capsule TAKE 1 CAPSULE TWICE WEEKLY 24 capsule 0   No current facility-administered medications on file prior to visit.   Past Medical History:  Diagnosis Date   Allergy    Anxiety    Depression    Diabetes mellitus without complication ([place])    Migraine headache    Nephrolithiasis    Past Surgical History:  Procedure Laterality Date   CESAREAN SECTION MULTI-GESTATIONAL N/A 02/26/2018   Procedure: CESAREAN SECTION MULTI-GESTATIONAL;  Surgeon: Linda Hedges, DO;  Location: [facility];  Service: Obstetrics;  Laterality: N/A;    Family History  Problem Relation Age of Onset   Mental illness Mother    Hypertension Maternal Grandmother    Breast cancer Maternal Grandmother    Social  History   Socioeconomic History   Marital status: Married    Spouse name: Not on file   Number of children: 2   Years of education: Not on file   Highest education level: Not on file  Occupational History   Occupation: 1rst grade teacher  Tobacco Use   Smoking status: Never   Smokeless tobacco: Never  Vaping Use   Vaping Use:  Never used  Substance and Sexual Activity   Alcohol use: No   Drug use: No   Sexual activity: Not Currently    Birth control/protection: None  Other Topics Concern   Not on file  Social History Narrative   Not on file   Social Determinants of Health   Financial Resource Strain: Low Risk  (09/27/2021)   Overall Financial Resource Strain (CARDIA)    Difficulty of Paying Living Expenses: Not hard at all  Food Insecurity: No Food Insecurity (09/27/2021)   Hunger Vital Sign    Worried About Running Out of Food in the Last Year: Never true    Ran Out of Food in the Last Year: Never true  Transportation Needs: No Transportation Needs (09/27/2021)   PRAPARE - Hydrologist (Medical): No    Lack of Transportation (Non-Medical): No  Physical Activity: Inactive (09/27/2021)   Exercise Vital Sign    Days of Exercise per Week: 0 days    Minutes of Exercise per Session: 0 min  Stress: No Stress Concern Present (09/27/2021)   [place]    Feeling of Stress : Not at all  Social Connections: Moderately Isolated (09/27/2021)   Social Connection and Isolation Panel [NHANES]    Frequency of Communication with Friends and Family: More than three times a week    Frequency of Social Gatherings with Friends and Family: More than three times a week    Attends Religious Services: Never    Marine scientist or Organizations: No    Attends Archivist Meetings: Never    Marital Status: Married    Review of Systems  Constitutional:  Negative for chills, fatigue and  fever.  HENT:  Positive for sore throat. Negative for congestion, ear pain and rhinorrhea.   Respiratory:  Negative for cough and shortness of breath.   Cardiovascular:  Negative for chest pain.  Gastrointestinal:  Negative for abdominal pain, constipation, diarrhea, nausea and vomiting.  Genitourinary:  Negative for dysuria and urgency.  Musculoskeletal:  Negative for back pain and myalgias.  Allergic/Immunologic: Positive for environmental allergies.  Neurological:  Positive for dizziness. Negative for weakness, light-headedness and headaches.  Psychiatric/Behavioral:  Negative for dysphoric mood. The patient is not nervous/anxious.      Objective:  BP 116/74   Pulse 67   Temp (!) 97.2 F (36.2 C)   Ht $R'5\' 8"'XW$  (1.727 m)   Wt 218 lb (98.9 kg)   SpO2 98%   BMI 33.15 kg/m       09/27/2021    3:19 PM 05/20/2021   10:03 AM 02/19/2021    7:52 AM  BP/Weight  Systolic BP  465 035  Diastolic BP  78 74  Wt. (Lbs) 218 226 227  BMI 33.15 kg/m2 34.36 kg/m2 34.52 kg/m2    Physical Exam Vitals reviewed.  Constitutional:      Appearance: Normal appearance.  HENT:     Right Ear: Tympanic membrane normal.     Left Ear: Tympanic membrane normal.     Nose: Nose normal.     Mouth/Throat:     Pharynx: Posterior oropharyngeal erythema present.  Eyes:     Pupils: Pupils are equal, round, and reactive to light.     Comments: Eyeglasses in place  Cardiovascular:     Rate and Rhythm: Normal rate and regular rhythm.     Pulses: Normal pulses.     Heart sounds: Normal heart sounds.  Pulmonary:     Effort: Pulmonary effort is normal.     Breath sounds: Normal breath sounds.  Abdominal:     General: Bowel sounds are normal.     Palpations: Abdomen is soft.  Skin:    General: Skin is warm and dry.     Capillary Refill: Capillary refill takes less than 2 seconds.  Neurological:     General: No focal deficit present.     Mental Status: She is alert and oriented to person, place, and time.   Psychiatric:        Mood and Affect: Mood normal.        Behavior: Behavior normal.    Lab Results  Component Value Date   WBC 9.3 05/20/2021   HGB 12.9 05/20/2021   HCT 40.2 05/20/2021   PLT 467 (H) 05/20/2021   GLUCOSE 90 05/20/2021   CHOL 167 02/19/2021   TRIG 111 02/19/2021   HDL 50 02/19/2021   LDLCALC 97 02/19/2021   ALT 13 05/20/2021   AST 18 05/20/2021   NA 139 05/20/2021   K 4.3 05/20/2021   CL 101 05/20/2021   CREATININE 0.70 05/20/2021   BUN 11 05/20/2021   CO2 25 05/20/2021   TSH 0.749 05/20/2021   HGBA1C 6.2 (H) 02/19/2021   MICROALBUR 30 04/07/2020      Assessment & Plan:   1. Type 2 diabetes mellitus with hyperglycemia, without long-term current use of insulin (HCC) - CBC with Differential/Platelet - Comprehensive metabolic panel - Hemoglobin A1c - Semaglutide, 2 MG/DOSE, 8 MG/3ML SOPN; Inject 2 mg as directed once a week.  Dispense: 3 mL; Refill: 1 - B12 and Folate Panel - cyanocobalamin (VITAMIN B12) 1000 MCG/ML injection; Inject 1 mL (1,000 mcg total) into the muscle once a week. Inject 1 mL (1,000 mcg total) into muscle once weekly for 4 weeks then every 30 days  Dispense: 4 mL; Refill: 2  2. Mixed hyperlipidemia-well controlled - CBC with Differential/Platelet - Comprehensive metabolic panel -continue Crestor 10 mg QD  3. Adult ADHD-well controlled - CBC with Differential/Platelet - Comprehensive metabolic panel - methylphenidate (METADATE CD) 30 MG CR capsule; Take 1 capsule (30 mg total) by mouth every morning.  Dispense: 30 capsule; Refill: 0 -stop Vyvanse 30 mg QD  4. Anxiety with depression-well controlled - CBC with Differential/Platelet - Comprehensive metabolic panel -continue Citalopram 20 mg QD  5. B12 deficiency - cyanocobalamin (VITAMIN B12) 1000 MCG/ML injection; Inject 1 mL (1,000 mcg total) into the muscle once a week. Inject 1 mL (1,000 mcg total) into muscle once weekly for 4 weeks then every 30 days  Dispense: 4 mL;  Refill: 2  6. Fatigue, unspecified type - B12 and Folate Panel  7. Nausea - ondansetron (ZOFRAN-ODT) 4 MG disintegrating tablet; TAKE 1 TABLET BY MOUTH EVERY 8 HOURS AS NEEDED FOR NAUSEA AND VOMITING  Dispense: 30 tablet; Refill: 1   We will call you with lab results Continue medications Stop Vyvanse Begin Methylphenidate 30 mg daily for ADHD Increase Ozempic to 2 mg injection weekly Begin B12 injection weekly for 4 weeks, then monthly Follow-up in 40-months    Follow-up: 28-months  An After Visit Summary was printed and given to the patient.  I, Rip Harbour, NP, have reviewed all documentation for this visit. The documentation on 09/27/21 for the exam, diagnosis, procedures, and orders are all accurate and complete.    Signed, Rip Harbour, NP [place] [phone]

## 2021-09-27 NOTE — Patient Instructions (Addendum)
We will call you with lab results Continue medications Stop Vyvanse Begin Methylphenidate 30 mg daily for ADHD Increase Ozempic to 2 mg injection weekly Begin B12 injection weekly for 4 weeks, then monthly Follow-up in 64-months   Preventing Vitamin D Deficiency Vitamin D deficiency is when your body does not have enough vitamin D. Vitamin D is important because it helps your body maintain calcium and phosphorus levels. It plays a key role in the health of bones and teeth, reduces inflammation, and improves the body's defense system (immune system). Our bodies make vitamin D when our skin is exposed to direct sunlight. However, for many people, this may not be enough vitamin D to meet the body's needs. How can this condition affect me? If vitamin D deficiency is severe, it can cause a condition in which a person's bones become softer than normal. In adults, this condition is called osteomalacia. In children, this condition is called rickets. Vitamin D deficiency can also cause weak or thin bones (osteoporosis) in adults. What can increase my risk? You may be at risk for a vitamin D deficiency if you: Are pregnant. Are obese. Are an older adult. Have dark skin. Take certain medicines that affect the way vitamin D is absorbed. Have had a surgery in which a part of the stomach or a part of the small intestine was removed. Other risk factors include: Having a condition that limits your ability to absorb fat, such as Crohn's disease, long-term (chronic) pancreatitis, or cystic fibrosis. Having certain conditions that are passed from parent to child (inherited). Not having access to foods rich in vitamin D. Having limited ability to move and go outside safely. Living in areas that have fewer hours of sunlight. Spending most of your day indoors, or covering your skin all the time when you are outdoors. What actions can I take to reduce my risk of a vitamin D deficiency? Knowing the best  sources of vitamin D You can meet your daily vitamin D needs from: Foods. Dietary supplements. Direct exposure to natural sunlight. Infant formula, for infants. Knowing how much vitamin D you need General recommendations for daily vitamin D intake vary by these categories: Infants: 400 international units (IU). Children older than 1 year: 600 international units. Adults: 600 international units. Pregnant and breastfeeding women: 600 international units. Adults older than 70 years: 800 international units. These are minimum levels of recommended amounts. Your health care provider may recommend a different amount of vitamin D intake based on your specific needs and your overall health. Getting sun exposure Get regular, safe exposure to natural sunlight. Expose your skin to direct sunlight for at least 15 minutes every day. If you have dark skin, you may need to expose your skin for a longer period of time. Protect your skin from too much sun exposure. This helps to prevent skin cancer. Ask your health care provider if regular sun exposure is safe for you. Do not use a tanning bed. Eating and drinking  Eat foods that naturally contain vitamin D. These include: Beef liver. Eggs. The vitamin D is in the yolk. Fish, such as salmon or trout. Mushrooms that were treated with UV light. Eat or drink products that have vitamin D added to them (are fortified). These may include: Cereals. Milk, including plant-based alternatives such as almond, soy, or oat milks. Orange juice. Margarine. When choosing foods, check the food label on the package to see: How much vitamin D is in the item. If the food is fortified  with vitamin D. The items listed above may not be a complete list of foods and beverages you can eat and drink. Contact a dietitian for more information. Taking supplements and medicines If you are at risk for vitamin D deficiency, or if you have certain diseases, your health care  provider may recommend that you take a vitamin D supplement. Make sure you: Talk with your health care provider before you start taking any vitamin D supplements. You may be more sensitive to the side effects of vitamin D supplements if you are on certain medicines or have certain medical conditions. Tell your health care provider about all medicines you are taking, including vitamins, herbs, eye drops, creams, and over-the-counter medicines. Take over-the-counter and prescription medicines only as told by your health care provider. Take supplements only as told by your health care provider. To increase absorption of your supplement, take it with a meal or snack. Summary Vitamin D plays a key role in the health of bones and teeth, reduces inflammation, and improves the body's defense system (immune system). A vitamin D deficiency can put you at risk of developing conditions such as rickets or osteoporosis. Our bodies make vitamin D when our skin is exposed to direct sunlight. However, for many people, this may not be enough vitamin D to meet the body's needs. Some foods naturally contain vitamin D, including beef liver, egg yolk, and fish. Eat or drink products that have vitamin D added to them (are fortified). This information is not intended to replace advice given to you by your health care provider. Make sure you discuss any questions you have with your health care provider. Document Revised: 10/02/2020 Document Reviewed: 10/02/2020 Elsevier Patient Education  2023 [employer].

## 2021-09-28 ENCOUNTER — Telehealth: Payer: Self-pay

## 2021-09-28 LAB — CBC WITH DIFFERENTIAL/PLATELET
Basophils Absolute: 0 10*3/uL (ref 0.0–0.2)
Basos: 0 %
EOS (ABSOLUTE): 0.1 10*3/uL (ref 0.0–0.4)
Eos: 1 %
Hematocrit: 36.3 % (ref 34.0–46.6)
Hemoglobin: 11.6 g/dL (ref 11.1–15.9)
Immature Grans (Abs): 0 10*3/uL (ref 0.0–0.1)
Immature Granulocytes: 0 %
Lymphocytes Absolute: 3.7 10*3/uL — ABNORMAL HIGH (ref 0.7–3.1)
Lymphs: 31 %
MCH: 26.2 pg — ABNORMAL LOW (ref 26.6–33.0)
MCHC: 32 g/dL (ref 31.5–35.7)
MCV: 82 fL (ref 79–97)
Monocytes Absolute: 0.6 10*3/uL (ref 0.1–0.9)
Monocytes: 5 %
Neutrophils Absolute: 7.4 10*3/uL — ABNORMAL HIGH (ref 1.4–7.0)
Neutrophils: 63 %
Platelets: 402 10*3/uL (ref 150–450)
RBC: 4.42 x10E6/uL (ref 3.77–5.28)
RDW: 13.2 % (ref 11.7–15.4)
WBC: 11.9 10*3/uL — ABNORMAL HIGH (ref 3.4–10.8)

## 2021-09-28 LAB — COMPREHENSIVE METABOLIC PANEL
ALT: 22 IU/L (ref 0–32)
AST: 27 IU/L (ref 0–40)
Albumin/Globulin Ratio: 1.7 (ref 1.2–2.2)
Albumin: 4.3 g/dL (ref 4.0–5.0)
Alkaline Phosphatase: 71 IU/L (ref 44–121)
BUN/Creatinine Ratio: 13 (ref 9–23)
BUN: 9 mg/dL (ref 6–20)
Bilirubin Total: 0.3 mg/dL (ref 0.0–1.2)
CO2: 24 mmol/L (ref 20–29)
Calcium: 9.9 mg/dL (ref 8.7–10.2)
Chloride: 100 mmol/L (ref 96–106)
Creatinine, Ser: 0.71 mg/dL (ref 0.57–1.00)
Globulin, Total: 2.5 g/dL (ref 1.5–4.5)
Glucose: 83 mg/dL (ref 70–99)
Potassium: 4.3 mmol/L (ref 3.5–5.2)
Sodium: 138 mmol/L (ref 134–144)
Total Protein: 6.8 g/dL (ref 6.0–8.5)
eGFR: 117 mL/min/{1.73_m2} (ref >59)

## 2021-09-28 LAB — HEMOGLOBIN A1C
Est. average glucose Bld gHb Est-mCnc: 128 mg/dL
Hgb A1c MFr Bld: 6.1 % — ABNORMAL HIGH (ref 4.8–5.6)

## 2021-09-28 LAB — B12 AND FOLATE PANEL
Folate: 7.3 ng/mL (ref >3.0)
Vitamin B-12: 717 pg/mL (ref 232–1245)

## 2021-09-28 NOTE — Telephone Encounter (Signed)
PA submitted and approved via covermeds for Ozempic 2 mg.

## 2021-10-25 ENCOUNTER — Other Ambulatory Visit: Payer: Self-pay | Admitting: Nurse Practitioner

## 2021-10-29 ENCOUNTER — Telehealth: Payer: Self-pay

## 2021-10-29 ENCOUNTER — Other Ambulatory Visit: Payer: Self-pay | Admitting: Nurse Practitioner

## 2021-10-29 DIAGNOSIS — E1165 Type 2 diabetes mellitus with hyperglycemia: Secondary | ICD-10-CM

## 2021-10-29 MED ORDER — SEMAGLUTIDE (1 MG/DOSE) 4 MG/3ML ~~LOC~~ SOPN: 1.0000 mg | PEN_INJECTOR | SUBCUTANEOUS | 1 refills | Status: DC

## 2021-10-29 NOTE — Telephone Encounter (Signed)
Ozempic 2 mg is not available and she is requesting that 1 mg be sent to the pharmacy please.

## 2021-11-09 ENCOUNTER — Telehealth: Payer: Self-pay

## 2021-11-09 NOTE — Telephone Encounter (Signed)
PA submitted and approved for Ozempic via covermymeds. 

## 2021-11-14 ENCOUNTER — Other Ambulatory Visit: Payer: Self-pay | Admitting: Nurse Practitioner

## 2021-11-14 DIAGNOSIS — F909 Attention-deficit hyperactivity disorder, unspecified type: Secondary | ICD-10-CM

## 2021-11-15 MED ORDER — METHYLPHENIDATE HCL ER (CD) 30 MG PO CPCR: 30.0000 mg | ORAL_CAPSULE | ORAL | 0 refills | Status: DC

## 2021-12-19 ENCOUNTER — Other Ambulatory Visit: Payer: Self-pay | Admitting: Nurse Practitioner

## 2021-12-19 DIAGNOSIS — F909 Attention-deficit hyperactivity disorder, unspecified type: Secondary | ICD-10-CM

## 2021-12-20 ENCOUNTER — Other Ambulatory Visit: Payer: Self-pay | Admitting: Nurse Practitioner

## 2021-12-20 DIAGNOSIS — F909 Attention-deficit hyperactivity disorder, unspecified type: Secondary | ICD-10-CM

## 2021-12-21 MED ORDER — METHYLPHENIDATE HCL ER (CD) 30 MG PO CPCR: 30.0000 mg | ORAL_CAPSULE | ORAL | 0 refills | Status: DC

## 2021-12-27 NOTE — Progress Notes (Unsigned)
Subjective:  Patient ID: Angela Powers, female    DOB: 30-Mar-1990  Age: 31 y.o. MRN: [id number]  No chief complaint on file.   HPI   ADHD, follow-up: Pt has a history of ADHD for several years. Current treatment includes Methylphenidate 30 mg QD.   Vitamin D Def follow up: Management since that visit includes Vitamin D 50,000 units weekly. She reports excellent compliance with treatment. She is not having side effects.   Diabetes Mellitus Type II, Follow-up  Lab Results  Component Value Date   HGBA1C 6.1 (H) 09/27/2021   HGBA1C 6.2 (H) 02/19/2021   HGBA1C 7.7 (H) 11/13/2020   Wt Readings from Last 3 Encounters:  09/27/21 218 lb (98.9 kg)  05/20/21 226 lb (102.5 kg)  02/19/21 227 lb (103 kg)   Last seen for diabetes {1-12:18279} {days/wks/mos/yrs:310907} ago.  Management  includes metformin and ozempic. She reports excellent compliance with treatment. She is not having side effects.   Most Recent Eye Exam: *** Current exercise: none Current diet habits: in general, a "healthy" diet    Pertinent Labs: Lab Results  Component Value Date   CHOL 167 02/19/2021   HDL 50 02/19/2021   LDLCALC 97 02/19/2021   TRIG 111 02/19/2021   CHOLHDL 3.3 02/19/2021   Lab Results  Component Value Date   NA 138 09/27/2021   K 4.3 09/27/2021   CREATININE 0.71 09/27/2021   EGFR 117 09/27/2021   GFRNONAA >60 02/27/2018   GLUCOSE 83 09/27/2021      Lipid/Cholesterol, Follow-up  Last lipid panel Other pertinent labs  Lab Results  Component Value Date   CHOL 167 02/19/2021   HDL 50 02/19/2021   LDLCALC 97 02/19/2021   TRIG 111 02/19/2021   CHOLHDL 3.3 02/19/2021   Lab Results  Component Value Date   ALT 22 09/27/2021   AST 27 09/27/2021   PLT 402 09/27/2021   TSH 0.749 05/20/2021     Management includes crestor.  She reports excellent compliance with treatment. She is not having side effects.   Depression, Follow-up   Current treatment includes  citalopram.   She reports excellent compliance with treatment. She is not having side effects.   She reports {DESC; GOOD/FAIR/POOR:18685} tolerance of treatment. Current symptoms include: {Symptoms; depression:1002} She feels she is {improved/worse/unchanged:3041574} since last visit.     09/27/2021    3:23 PM 05/20/2021   10:03 AM 02/19/2021    7:55 AM  Depression screen PHQ 2/9  Decreased Interest 0 0 0  Down, Depressed, Hopeless 0 0 0  PHQ - 2 Score 0 0 0  Altered sleeping 0 0 3  Tired, decreased energy 3 0 0  Change in appetite 0 0 0  Feeling bad or failure about yourself  0 0 0  Trouble concentrating 1 0 0  Moving slowly or fidgety/restless 0 0 0  Suicidal thoughts 0 0 0  PHQ-9 Score 4 0 3  Difficult doing work/chores Not difficult at all Not difficult at all Not difficult at all      Current Outpatient Medications on File Prior to Visit  Medication Sig Dispense Refill   citalopram (CELEXA) 20 MG tablet TAKE 1 TABLET BY MOUTH EVERY DAY 90 tablet 1   cyanocobalamin (VITAMIN B12) 1000 MCG/ML injection Inject 1 mL (1,000 mcg total) into the muscle once a week. Inject 1 mL (1,000 mcg total) into muscle once weekly for 4 weeks then every 30 days 4 mL 2   metFORMIN (GLUCOPHAGE-XR) 500 MG 24 hr  tablet TAKE 2 TABLETS BY MOUTH EVERY DAY WITH BREAKFAST 180 tablet 2   methylphenidate (METADATE CD) 30 MG CR capsule Take 1 capsule (30 mg total) by mouth every morning. 30 capsule 0   ondansetron (ZOFRAN-ODT) 4 MG disintegrating tablet TAKE 1 TABLET BY MOUTH EVERY 8 HOURS AS NEEDED FOR NAUSEA AND VOMITING 30 tablet 1   rosuvastatin (CRESTOR) 10 MG tablet TAKE 1 TABLET BY MOUTH EVERY DAY 90 tablet 1   Semaglutide, 1 MG/DOSE, 4 MG/3ML SOPN Inject 1 mg as directed once a week. 3 mL 1   Semaglutide, 2 MG/DOSE, 8 MG/3ML SOPN Inject 2 mg as directed once a week. 3 mL 1   Vitamin D, Ergocalciferol, (DRISDOL) 1.25 MG (50000 UNIT) CAPS capsule TAKE 1 CAPSULE BY MOUTH TWICE WEEKLY 24 capsule 0    No current facility-administered medications on file prior to visit.   Past Medical History:  Diagnosis Date   Allergy    Anxiety    Depression    Diabetes mellitus without complication ([place])    Migraine headache    Nephrolithiasis    Past Surgical History:  Procedure Laterality Date   CESAREAN SECTION MULTI-GESTATIONAL N/A 02/26/2018   Procedure: CESAREAN SECTION MULTI-GESTATIONAL;  Surgeon: Linda Hedges, DO;  Location: [facility];  Service: Obstetrics;  Laterality: N/A;    Family History  Problem Relation Age of Onset   Mental illness Mother    Hypertension Maternal Grandmother    Breast cancer Maternal Grandmother    Social History   Socioeconomic History   Marital status: Married    Spouse name: Not on file   Number of children: 2   Years of education: Not on file   Highest education level: Not on file  Occupational History   Occupation: 1rst grade teacher  Tobacco Use   Smoking status: Never   Smokeless tobacco: Never  Vaping Use   Vaping Use: Never used  Substance and Sexual Activity   Alcohol use: No   Drug use: No   Sexual activity: Not Currently    Birth control/protection: None  Other Topics Concern   Not on file  Social History Narrative   Not on file   Social Determinants of Health   Financial Resource Strain: Low Risk  (09/27/2021)   Overall Financial Resource Strain (CARDIA)    Difficulty of Paying Living Expenses: Not hard at all  Food Insecurity: No Food Insecurity (09/27/2021)   Hunger Vital Sign    Worried About Running Out of Food in the Last Year: Never true    Ran Out of Food in the Last Year: Never true  Transportation Needs: No Transportation Needs (09/27/2021)   PRAPARE - Hydrologist (Medical): No    Lack of Transportation (Non-Medical): No  Physical Activity: Inactive (09/27/2021)   Exercise Vital Sign    Days of Exercise per Week: 0 days    Minutes of Exercise per Session: 0 min  Stress: No  Stress Concern Present (09/27/2021)   [place]    Feeling of Stress : Not at all  Social Connections: Moderately Isolated (09/27/2021)   Social Connection and Isolation Panel [NHANES]    Frequency of Communication with Friends and Family: More than three times a week    Frequency of Social Gatherings with Friends and Family: More than three times a week    Attends Religious Services: Never    Marine scientist or Organizations: No  Attends Archivist Meetings: Never    Marital Status: Married    Review of Systems  Constitutional:  Negative for chills, fatigue and fever.  HENT:  Negative for congestion, ear pain, rhinorrhea and sore throat.   Respiratory:  Negative for cough and shortness of breath.   Cardiovascular:  Negative for chest pain.  Gastrointestinal:  Negative for abdominal pain, constipation, diarrhea, nausea and vomiting.  Genitourinary:  Negative for dysuria and urgency.  Musculoskeletal:  Negative for back pain and myalgias.  Neurological:  Negative for dizziness, weakness, light-headedness and headaches.  Psychiatric/Behavioral:  Negative for dysphoric mood. The patient is not nervous/anxious.      Objective:  There were no vitals taken for this visit.     09/27/2021    3:19 PM 05/20/2021   10:03 AM 02/19/2021    7:52 AM  BP/Weight  Systolic BP 244 010 272  Diastolic BP 74 78 74  Wt. (Lbs) 218 226 227  BMI 33.15 kg/m2 34.36 kg/m2 34.52 kg/m2    Physical Exam  Diabetic Foot Exam - Simple   No data filed      Lab Results  Component Value Date   WBC 11.9 (H) 09/27/2021   HGB 11.6 09/27/2021   HCT 36.3 09/27/2021   PLT 402 09/27/2021   GLUCOSE 83 09/27/2021   CHOL 167 02/19/2021   TRIG 111 02/19/2021   HDL 50 02/19/2021   LDLCALC 97 02/19/2021   ALT 22 09/27/2021   AST 27 09/27/2021   NA 138 09/27/2021   K 4.3 09/27/2021   CL 100 09/27/2021   CREATININE 0.71  09/27/2021   BUN 9 09/27/2021   CO2 24 09/27/2021   TSH 0.749 05/20/2021   HGBA1C 6.1 (H) 09/27/2021   MICROALBUR 30 04/07/2020      Assessment & Plan:   Problem List Items Addressed This Visit       Endocrine   Type 2 diabetes mellitus ([place]) - Primary   Other Visit Diagnoses     Mixed hyperlipidemia       Adult ADHD       Anxiety with depression         .  No orders of the defined types were placed in this encounter.   No orders of the defined types were placed in this encounter.    Follow-up: No follow-ups on file.  An After Visit Summary was printed and given to the patient.  Rip Harbour, NP [place] [phone]

## 2021-12-28 ENCOUNTER — Ambulatory Visit: Payer: BC Managed Care – PPO | Admitting: Nurse Practitioner

## 2021-12-28 ENCOUNTER — Encounter: Payer: Self-pay | Admitting: Nurse Practitioner

## 2021-12-28 VITALS — BP 118/68 | HR 94 | Temp 97.2°F | Ht 68.0 in | Wt 214.0 lb

## 2021-12-28 DIAGNOSIS — E1165 Type 2 diabetes mellitus with hyperglycemia: Secondary | ICD-10-CM | POA: Diagnosis not present

## 2021-12-28 DIAGNOSIS — F418 Other specified anxiety disorders: Secondary | ICD-10-CM

## 2021-12-28 DIAGNOSIS — E559 Vitamin D deficiency, unspecified: Secondary | ICD-10-CM

## 2021-12-28 DIAGNOSIS — E782 Mixed hyperlipidemia: Secondary | ICD-10-CM

## 2021-12-28 DIAGNOSIS — F909 Attention-deficit hyperactivity disorder, unspecified type: Secondary | ICD-10-CM

## 2021-12-28 MED ORDER — SEMAGLUTIDE (1 MG/DOSE) 4 MG/3ML ~~LOC~~ SOPN: 1.0000 mg | PEN_INJECTOR | SUBCUTANEOUS | 1 refills | Status: DC

## 2021-12-28 MED ORDER — CITALOPRAM HYDROBROMIDE 10 MG PO TABS: 10.0000 mg | ORAL_TABLET | Freq: Every day | ORAL | 0 refills | Status: DC

## 2021-12-28 NOTE — Patient Instructions (Addendum)
We will call you with lab results Continue medications Follow-up in 32-months, fasting   Preventing Vitamin D Deficiency Vitamin D deficiency is when your body does not have enough vitamin D. Vitamin D is important because it helps your body maintain calcium and phosphorus levels. It plays a key role in the health of bones and teeth, reduces inflammation, and improves the body's defense system (immune system). Our bodies make vitamin D when our skin is exposed to direct sunlight. However, for many people, this may not be enough vitamin D to meet the body's needs. How can this condition affect me? If vitamin D deficiency is severe, it can cause a condition in which a person's bones become softer than normal. In adults, this condition is called osteomalacia. In children, this condition is called rickets. Vitamin D deficiency can also cause weak or thin bones (osteoporosis) in adults. What can increase my risk? You may be at risk for a vitamin D deficiency if you: Are pregnant. Are obese. Are an older adult. Have dark skin. Take certain medicines that affect the way vitamin D is absorbed. Have had a surgery in which a part of the stomach or a part of the small intestine was removed. Other risk factors include: Having a condition that limits your ability to absorb fat, such as Crohn's disease, long-term (chronic) pancreatitis, or cystic fibrosis. Having certain conditions that are passed from parent to child (inherited). Not having access to foods rich in vitamin D. Having limited ability to move and go outside safely. Living in areas that have fewer hours of sunlight. Spending most of your day indoors, or covering your skin all the time when you are outdoors. What actions can I take to reduce my risk of a vitamin D deficiency? Knowing the best sources of vitamin D You can meet your daily vitamin D needs from: Foods. Dietary supplements. Direct exposure to natural sunlight. Infant formula,  for infants. Knowing how much vitamin D you need General recommendations for daily vitamin D intake vary by these categories: Infants: 400 international units (IU). Children older than 1 year: 600 international units. Adults: 600 international units. Pregnant and breastfeeding women: 600 international units. Adults older than 70 years: 800 international units. These are minimum levels of recommended amounts. Your health care provider may recommend a different amount of vitamin D intake based on your specific needs and your overall health. Getting sun exposure Get regular, safe exposure to natural sunlight. Expose your skin to direct sunlight for at least 15 minutes every day. If you have dark skin, you may need to expose your skin for a longer period of time. Protect your skin from too much sun exposure. This helps to prevent skin cancer. Ask your health care provider if regular sun exposure is safe for you. Do not use a tanning bed. Eating and drinking  Eat foods that naturally contain vitamin D. These include: Beef liver. Eggs. The vitamin D is in the yolk. Fish, such as salmon or trout. Mushrooms that were treated with UV light. Eat or drink products that have vitamin D added to them (are fortified). These may include: Cereals. Milk, including plant-based alternatives such as almond, soy, or oat milks. Orange juice. Margarine. When choosing foods, check the food label on the package to see: How much vitamin D is in the item. If the food is fortified with vitamin D. The items listed above may not be a complete list of foods and beverages you can eat and drink. Contact a  dietitian for more information. Taking supplements and medicines If you are at risk for vitamin D deficiency, or if you have certain diseases, your health care provider may recommend that you take a vitamin D supplement. Make sure you: Talk with your health care provider before you start taking any vitamin D  supplements. You may be more sensitive to the side effects of vitamin D supplements if you are on certain medicines or have certain medical conditions. Tell your health care provider about all medicines you are taking, including vitamins, herbs, eye drops, creams, and over-the-counter medicines. Take over-the-counter and prescription medicines only as told by your health care provider. Take supplements only as told by your health care provider. To increase absorption of your supplement, take it with a meal or snack. Summary Vitamin D plays a key role in the health of bones and teeth, reduces inflammation, and improves the body's defense system (immune system). A vitamin D deficiency can put you at risk of developing conditions such as rickets or osteoporosis. Our bodies make vitamin D when our skin is exposed to direct sunlight. However, for many people, this may not be enough vitamin D to meet the body's needs. Some foods naturally contain vitamin D, including beef liver, egg yolk, and fish. Eat or drink products that have vitamin D added to them (are fortified). This information is not intended to replace advice given to you by your health care provider. Make sure you discuss any questions you have with your health care provider. Document Revised: 10/02/2020 Document Reviewed: 10/02/2020 Elsevier Patient Education  [place].    Diabetes Mellitus and Nutrition, Adult When you have diabetes, or diabetes mellitus, it is very important to have healthy eating habits because your blood sugar (glucose) levels are greatly affected by what you eat and drink. Eating healthy foods in the right amounts, at about the same times every day, can help you: Manage your blood glucose. Lower your risk of heart disease. Improve your blood pressure. Reach or maintain a healthy weight. What can affect my meal plan? Every person with diabetes is different, and each person has different needs for a meal  plan. Your health care provider may recommend that you work with a dietitian to make a meal plan that is best for you. Your meal plan may vary depending on factors such as: The calories you need. The medicines you take. Your weight. Your blood glucose, blood pressure, and cholesterol levels. Your activity level. Other health conditions you have, such as heart or kidney disease. How do carbohydrates affect me? Carbohydrates, also called carbs, affect your blood glucose level more than any other type of food. Eating carbs raises the amount of glucose in your blood. It is important to know how many carbs you can safely have in each meal. This is different for every person. Your dietitian can help you calculate how many carbs you should have at each meal and for each snack. How does alcohol affect me? Alcohol can cause a decrease in blood glucose (hypoglycemia), especially if you use insulin or take certain diabetes medicines by mouth. Hypoglycemia can be a life-threatening condition. Symptoms of hypoglycemia, such as sleepiness, dizziness, and confusion, are similar to symptoms of having too much alcohol. Do not drink alcohol if: Your health care provider tells you not to drink. You are pregnant, may be pregnant, or are planning to become pregnant. If you drink alcohol: Limit how much you have to: 0-1 drink a day for women. 0-2 drinks  a day for men. Know how much alcohol is in your drink. In the [place] one drink equals one 12 oz bottle of beer (355 mL), one 5 oz glass of wine (148 mL), or one 1 oz glass of hard liquor (44 mL). Keep yourself hydrated with water, diet soda, or unsweetened iced tea. Keep in mind that regular soda, juice, and other mixers may contain a lot of sugar and must be counted as carbs. What are tips for following this plan?  Reading food labels Start by checking the serving size on the Nutrition Facts label of packaged foods and drinks. The number of calories and the amount  of carbs, fats, and other nutrients listed on the label are based on one serving of the item. Many items contain more than one serving per package. Check the total grams (g) of carbs in one serving. Check the number of grams of saturated fats and trans fats in one serving. Choose foods that have a low amount or none of these fats. Check the number of milligrams (mg) of salt (sodium) in one serving. Most people should limit total sodium intake to less than 2,300 mg per day. Always check the nutrition information of foods labeled as "low-fat" or "nonfat." These foods may be higher in added sugar or refined carbs and should be avoided. Talk to your dietitian to identify your daily goals for nutrients listed on the label. Shopping Avoid buying canned, pre-made, or processed foods. These foods tend to be high in fat, sodium, and added sugar. Shop around the outside edge of the grocery store. This is where you will most often find fresh fruits and vegetables, bulk grains, fresh meats, and fresh dairy products. Cooking Use low-heat cooking methods, such as baking, instead of high-heat cooking methods, such as deep frying. Cook using healthy oils, such as olive, canola, or sunflower oil. Avoid cooking with butter, cream, or high-fat meats. Meal planning Eat meals and snacks regularly, preferably at the same times every day. Avoid going long periods of time without eating. Eat foods that are high in fiber, such as fresh fruits, vegetables, beans, and whole grains. Eat 4-6 oz (112-168 g) of lean protein each day, such as lean meat, chicken, fish, eggs, or tofu. One ounce (oz) (28 g) of lean protein is equal to: 1 oz (28 g) of meat, chicken, or fish. 1 egg.  cup (62 g) of tofu. Eat some foods each day that contain healthy fats, such as avocado, nuts, seeds, and fish. What foods should I eat? Fruits Berries. Apples. Oranges. Peaches. Apricots. Plums. Grapes. Mangoes. Papayas. Pomegranates. Kiwi.  Cherries. Vegetables Leafy greens, including lettuce, spinach, kale, chard, collard greens, mustard greens, and cabbage. Beets. Cauliflower. Broccoli. Carrots. Green beans. Tomatoes. Peppers. Onions. Cucumbers. Brussels sprouts. Grains Whole grains, such as whole-wheat or whole-grain bread, crackers, tortillas, cereal, and pasta. Unsweetened oatmeal. Quinoa. Brown or wild rice. Meats and other proteins Seafood. Poultry without skin. Lean cuts of poultry and beef. Tofu. Nuts. Seeds. Dairy Low-fat or fat-free dairy products such as milk, yogurt, and cheese. The items listed above may not be a complete list of foods and beverages you can eat and drink. Contact a dietitian for more information. What foods should I avoid? Fruits Fruits canned with syrup. Vegetables Canned vegetables. Frozen vegetables with butter or cream sauce. Grains Refined white flour and flour products such as bread, pasta, snack foods, and cereals. Avoid all processed foods. Meats and other proteins Fatty cuts of meat. Poultry with skin. Breaded  or fried meats. Processed meat. Avoid saturated fats. Dairy Full-fat yogurt, cheese, or milk. Beverages Sweetened drinks, such as soda or iced tea. The items listed above may not be a complete list of foods and beverages you should avoid. Contact a dietitian for more information. Questions to ask a health care provider Do I need to meet with a certified diabetes care and education specialist? Do I need to meet with a dietitian? What number can I call if I have questions? When are the best times to check my blood glucose? Where to find more information: American Diabetes Association: diabetes.org Academy of Nutrition and Dietetics: eatright.[employer] of Diabetes and Digestive and Kidney Diseases: [employer] Association of Diabetes Care & Education Specialists: diabeteseducator.org Summary It is important to have healthy eating habits because your blood sugar  (glucose) levels are greatly affected by what you eat and drink. It is important to use alcohol carefully. A healthy meal plan will help you manage your blood glucose and lower your risk of heart disease. Your health care provider may recommend that you work with a dietitian to make a meal plan that is best for you. This information is not intended to replace advice given to you by your health care provider. Make sure you discuss any questions you have with your health care provider. Document Revised: 07/31/2019 Document Reviewed: 07/31/2019 Elsevier Patient Education  [place].

## 2021-12-29 LAB — COMPREHENSIVE METABOLIC PANEL
ALT: 14 IU/L (ref 0–32)
AST: 17 IU/L (ref 0–40)
Albumin/Globulin Ratio: 1.4 (ref 1.2–2.2)
Albumin: 4.2 g/dL (ref 3.9–4.9)
Alkaline Phosphatase: 82 IU/L (ref 44–121)
BUN/Creatinine Ratio: 14 (ref 9–23)
BUN: 8 mg/dL (ref 6–20)
Bilirubin Total: 0.2 mg/dL (ref 0.0–1.2)
CO2: 24 mmol/L (ref 20–29)
Calcium: 9.2 mg/dL (ref 8.7–10.2)
Chloride: 101 mmol/L (ref 96–106)
Creatinine, Ser: 0.58 mg/dL (ref 0.57–1.00)
Globulin, Total: 2.9 g/dL (ref 1.5–4.5)
Glucose: 73 mg/dL (ref 70–99)
Potassium: 4.6 mmol/L (ref 3.5–5.2)
Sodium: 138 mmol/L (ref 134–144)
Total Protein: 7.1 g/dL (ref 6.0–8.5)
eGFR: 124 mL/min/{1.73_m2} (ref >59)

## 2021-12-29 LAB — CBC WITH DIFFERENTIAL/PLATELET
Basophils Absolute: 0 10*3/uL (ref 0.0–0.2)
Basos: 0 %
EOS (ABSOLUTE): 0.1 10*3/uL (ref 0.0–0.4)
Eos: 1 %
Hematocrit: 35.3 % (ref 34.0–46.6)
Hemoglobin: 10.9 g/dL — ABNORMAL LOW (ref 11.1–15.9)
Immature Grans (Abs): 0 10*3/uL (ref 0.0–0.1)
Immature Granulocytes: 0 %
Lymphocytes Absolute: 2.6 10*3/uL (ref 0.7–3.1)
Lymphs: 24 %
MCH: 25.8 pg — ABNORMAL LOW (ref 26.6–33.0)
MCHC: 30.9 g/dL — ABNORMAL LOW (ref 31.5–35.7)
MCV: 84 fL (ref 79–97)
Monocytes Absolute: 0.6 10*3/uL (ref 0.1–0.9)
Monocytes: 6 %
Neutrophils Absolute: 7.3 10*3/uL — ABNORMAL HIGH (ref 1.4–7.0)
Neutrophils: 69 %
Platelets: 521 10*3/uL — ABNORMAL HIGH (ref 150–450)
RBC: 4.23 x10E6/uL (ref 3.77–5.28)
RDW: 14.2 % (ref 11.7–15.4)
WBC: 10.7 10*3/uL (ref 3.4–10.8)

## 2021-12-29 LAB — LIPID PANEL
Chol/HDL Ratio: 3 ratio (ref 0.0–4.4)
Cholesterol, Total: 159 mg/dL (ref 100–199)
HDL: 53 mg/dL (ref >39)
LDL Chol Calc (NIH): 83 mg/dL (ref 0–99)
Triglycerides: 131 mg/dL (ref 0–149)
VLDL Cholesterol Cal: 23 mg/dL (ref 5–40)

## 2021-12-29 LAB — HEMOGLOBIN A1C
Est. average glucose Bld gHb Est-mCnc: 120 mg/dL
Hgb A1c MFr Bld: 5.8 % — ABNORMAL HIGH (ref 4.8–5.6)

## 2021-12-29 LAB — CARDIOVASCULAR RISK ASSESSMENT

## 2021-12-29 LAB — VITAMIN D 25 HYDROXY (VIT D DEFICIENCY, FRACTURES): Vit D, 25-Hydroxy: 84.4 ng/mL (ref 30.0–100.0)

## 2022-01-18 ENCOUNTER — Other Ambulatory Visit: Payer: Self-pay | Admitting: Nurse Practitioner

## 2022-01-20 ENCOUNTER — Other Ambulatory Visit: Payer: Self-pay | Admitting: Nurse Practitioner

## 2022-01-20 DIAGNOSIS — F909 Attention-deficit hyperactivity disorder, unspecified type: Secondary | ICD-10-CM

## 2022-01-21 MED ORDER — METHYLPHENIDATE HCL ER (CD) 30 MG PO CPCR: 30.0000 mg | ORAL_CAPSULE | ORAL | 0 refills | Status: DC

## 2022-02-02 ENCOUNTER — Other Ambulatory Visit: Payer: Self-pay | Admitting: Nurse Practitioner

## 2022-02-02 DIAGNOSIS — E1165 Type 2 diabetes mellitus with hyperglycemia: Secondary | ICD-10-CM

## 2022-02-21 ENCOUNTER — Other Ambulatory Visit: Payer: Self-pay | Admitting: Nurse Practitioner

## 2022-02-21 ENCOUNTER — Other Ambulatory Visit: Payer: Self-pay

## 2022-02-21 DIAGNOSIS — E1165 Type 2 diabetes mellitus with hyperglycemia: Secondary | ICD-10-CM

## 2022-02-21 DIAGNOSIS — F909 Attention-deficit hyperactivity disorder, unspecified type: Secondary | ICD-10-CM

## 2022-02-21 MED ORDER — SEMAGLUTIDE (2 MG/DOSE) 8 MG/3ML ~~LOC~~ SOPN: 2.0000 mg | PEN_INJECTOR | SUBCUTANEOUS | 1 refills | Status: DC

## 2022-02-21 MED ORDER — METHYLPHENIDATE HCL ER (CD) 30 MG PO CPCR: 30.0000 mg | ORAL_CAPSULE | ORAL | 0 refills | Status: DC

## 2022-02-21 NOTE — Telephone Encounter (Signed)
Angela Powers's methylphenidate was not available in [facility].  Can you please resend to [facility].

## 2022-02-22 ENCOUNTER — Other Ambulatory Visit: Payer: Self-pay

## 2022-02-22 DIAGNOSIS — E1165 Type 2 diabetes mellitus with hyperglycemia: Secondary | ICD-10-CM

## 2022-03-07 ENCOUNTER — Ambulatory Visit: Admit: 2022-03-07 | Discharge status: Against Medical Advice | Payer: BC Managed Care – PPO

## 2022-03-08 ENCOUNTER — Other Ambulatory Visit: Payer: Self-pay

## 2022-03-08 ENCOUNTER — Ambulatory Visit
Admission: RE | Admit: 2022-03-08 | Discharge: 2022-03-08 | Disposition: A | Discharge status: Home / Self Care | Payer: BC Managed Care – PPO | Source: Ambulatory Visit | Attending: Physician Assistant | Admitting: Physician Assistant

## 2022-03-08 VITALS — BP 138/77 | HR 76 | Temp 99.2°F | Resp 18

## 2022-03-08 DIAGNOSIS — J029 Acute pharyngitis, unspecified: Secondary | ICD-10-CM | POA: Diagnosis not present

## 2022-03-08 DIAGNOSIS — J069 Acute upper respiratory infection, unspecified: Secondary | ICD-10-CM | POA: Diagnosis not present

## 2022-03-08 LAB — POCT RAPID STREP A (OFFICE): Rapid Strep A Screen: NEGATIVE

## 2022-03-08 MED ORDER — DM-GUAIFENESIN ER 30-600 MG PO TB12: 1.0000 | ORAL_TABLET | Freq: Two times a day (BID) | ORAL | 0 refills | Status: DC

## 2022-03-08 NOTE — ED Provider Notes (Signed)
[facility]    CSN: [id number] Arrival date & time: 03/08/22  0909      History   Chief Complaint Chief Complaint  Patient presents with   Cough    Cough, sore throat - Entered by patient    HPI Angela Powers is a 32 y.o. female.   32 year old female presents with sore throat, cough and congestion.  Patient indicates for the past 4 days she has been having increased upper respiratory congestion with rhinitis and postnasal drip mainly clear to yellow production.  Patient also indicates she has been having sore throat with progressive pain on swallowing over the past couple days.  She indicates she has had mild chest congestion with intermittent cough mainly clear to yellow production.  She is without wheezing or shortness of breath.  She indicates she has not been having fever or chills.  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  He has been taking OTC medications with minimal improvement of symptoms.   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 Authorizing Provider  dextromethorphan-guaiFENesin (MUCINEX DM) 30-600 MG 12hr tablet Take 1 tablet by mouth 2 (two) times daily. 03/08/22  Yes Nyoka Lint, PA-C  citalopram (CELEXA) 10 MG tablet Take 1 tablet (10 mg total) by mouth daily. Take with Citalopram 20 mg to equal 30 mg. 12/28/21   Rip Harbour, NP  citalopram (CELEXA) 20 MG tablet TAKE 1 TABLET BY MOUTH EVERY DAY 08/14/21   Rip Harbour, NP  cyanocobalamin (VITAMIN B12) 1000 MCG/ML injection Inject 1 mL (1,000 mcg total) into the muscle once a week. Inject 1 mL (1,000 mcg total) into muscle once weekly for 4 weeks then every 30 days 09/27/21   Rip Harbour, NP  metFORMIN (GLUCOPHAGE-XR) 500 MG 24 hr tablet TAKE 2 TABLETS BY MOUTH EVERY DAY WITH BREAKFAST 02/02/22   Rip Harbour, NP  methylphenidate (METADATE CD) 30 MG CR capsule Take 1 capsule (30 mg total) by mouth every morning. 02/21/22   Cox, Elnita Maxwell, MD  ondansetron (ZOFRAN-ODT) 4 MG disintegrating tablet TAKE 1 TABLET BY MOUTH EVERY 8 HOURS AS NEEDED FOR NAUSEA AND VOMITING 09/27/21   Rip Harbour, NP  rosuvastatin (  CRESTOR) 10 MG tablet TAKE 1 TABLET BY MOUTH EVERY DAY 08/14/21   Rip Harbour, NP  Semaglutide, 1 MG/DOSE, 4 MG/3ML SOPN Inject 1 mg as directed once a week. 12/28/21   Rip Harbour, NP  Semaglutide, 2 MG/DOSE, 8 MG/3ML SOPN Inject 2 mg as directed once a week. 02/21/22   Neil Crouch, FNP  Vitamin D, Ergocalciferol, (DRISDOL) 1.25 MG (50000 UNIT) CAPS capsule TAKE 1 CAPSULE BY MOUTH TWICE WEEKLY 01/20/22   Rip Harbour, NP    Family History Family History  Problem Relation Age of Onset   Mental illness Mother    Hypertension Maternal Grandmother    Breast cancer Maternal Grandmother     Social History Social History   Tobacco Use   Smoking status: Never   Smokeless tobacco: Never  Vaping Use   Vaping Use: Never used  Substance Use Topics    Alcohol use: No   Drug use: No     Allergies   Patient has no known allergies.   Review of Systems Review of Systems  HENT:  Positive for sore throat.   Respiratory:  Positive for cough.      Physical Exam Triage Vital Signs ED Triage Vitals  Enc Vitals Group     BP 03/08/22 0933 138/77     Pulse Rate 03/08/22 0933 76     Resp 03/08/22 0933 18     Temp 03/08/22 0933 99.2 F (37.3 C)     Temp Source 03/08/22 0933 Oral     SpO2 03/08/22 0933 98 %     Weight --      Height --      Head Circumference --      Peak Flow --      Pain Score 03/08/22 0934 3     Pain Loc --      Pain Edu? --      Excl. in [place]? --    No data found.  Updated Vital Signs BP 138/77 (BP Location: Left Arm)   Pulse 76   Temp 99.2 F (37.3 C) (Oral)   Resp 18   SpO2 98%   Visual Acuity Right Eye Distance:   Left Eye Distance:   Bilateral Distance:    Right Eye Near:   Left Eye Near:    Bilateral Near:     Physical Exam Constitutional:      Appearance: Normal appearance.  HENT:     Right Ear: Tympanic membrane and ear canal normal.     Left Ear: Tympanic membrane and ear canal normal.     Mouth/Throat:     Mouth: Mucous membranes are moist.     Pharynx: Posterior oropharyngeal erythema present. No oropharyngeal exudate.  Cardiovascular:     Rate and Rhythm: Normal rate and regular rhythm.     Heart sounds: Normal heart sounds.  Pulmonary:     Effort: Pulmonary effort is normal.     Breath sounds: Normal breath sounds and air entry. No wheezing, rhonchi or rales.  Lymphadenopathy:     Cervical: No cervical adenopathy.  Neurological:     Mental Status: She is alert.      UC Treatments / Results  Labs (all labs ordered are listed, but only abnormal results are displayed) Labs Reviewed  CULTURE, GROUP A STREP [employer])  POCT RAPID STREP A (OFFICE)    EKG   Radiology No results found.  Procedures Procedures (including critical care time)  Medications Ordered in  UC Medications - No  data to display  Initial Impression / Assessment and Plan / UC Course  I have reviewed the triage vital signs and the nursing notes.  Pertinent labs & imaging results that were available during my care of the patient were reviewed by me and considered in my medical decision making (see chart for details).    Plan: The diagnosis will be treated with the following: 1.  Upper respiratory infection: A.  Mucinex DM every 12 hours to treat cough and congestion. 2.  Sore throat: A.  Advised to use ibuprofen or Motrin to help relieve throat pain along with lozenges and gargles. 3.  Advised follow-up PCP or return to urgent care as needed. Final Clinical Impressions(s) / UC Diagnoses   Final diagnoses:  Acute upper respiratory infection  Sore throat     Discharge Instructions      Advised take Mucinex DM every 12 hours to help relieve cough and congestion. Advised take Tylenol or ibuprofen to help relieve pain from the sore throat. Advised to use salt water gargles, warm tea gargles along with lozenges to help soothe the pain from the sore throat. Advised follow-up PCP or return to urgent care as needed.     ED Prescriptions     Medication Sig Dispense Auth. Provider   dextromethorphan-guaiFENesin (MUCINEX DM) 30-600 MG 12hr tablet Take 1 tablet by mouth 2 (two) times daily. 20 tablet Nyoka Lint, PA-C      PDMP not reviewed this encounter.   Nyoka Lint, PA-C 03/08/22 1012

## 2022-03-08 NOTE — ED Triage Notes (Signed)
Pt here for URI sx x 4 days

## 2022-03-08 NOTE — Discharge Instructions (Addendum)
Advised take Mucinex DM every 12 hours to help relieve cough and congestion. Advised take Tylenol or ibuprofen to help relieve pain from the sore throat. Advised to use salt water gargles, warm tea gargles along with lozenges to help soothe the pain from the sore throat. Advised follow-up PCP or return to urgent care as needed.

## 2022-03-10 LAB — CULTURE, GROUP A STREP (THRC)

## 2022-03-23 ENCOUNTER — Other Ambulatory Visit: Payer: Self-pay

## 2022-03-23 DIAGNOSIS — F418 Other specified anxiety disorders: Secondary | ICD-10-CM

## 2022-03-23 MED ORDER — CITALOPRAM HYDROBROMIDE 20 MG PO TABS: 20.0000 mg | ORAL_TABLET | Freq: Every day | ORAL | 1 refills | Status: DC

## 2022-03-24 ENCOUNTER — Other Ambulatory Visit: Payer: Self-pay | Admitting: Family Medicine

## 2022-03-24 DIAGNOSIS — F909 Attention-deficit hyperactivity disorder, unspecified type: Secondary | ICD-10-CM

## 2022-03-25 ENCOUNTER — Other Ambulatory Visit: Payer: Self-pay

## 2022-03-25 DIAGNOSIS — E1165 Type 2 diabetes mellitus with hyperglycemia: Secondary | ICD-10-CM

## 2022-03-25 MED ORDER — METHYLPHENIDATE HCL ER (CD) 30 MG PO CPCR: 30.0000 mg | ORAL_CAPSULE | ORAL | 0 refills | Status: DC

## 2022-03-25 MED ORDER — SEMAGLUTIDE (1 MG/DOSE) 4 MG/3ML ~~LOC~~ SOPN: 1.0000 mg | PEN_INJECTOR | SUBCUTANEOUS | 0 refills | Status: DC

## 2022-03-28 ENCOUNTER — Other Ambulatory Visit: Payer: Self-pay

## 2022-03-28 DIAGNOSIS — R110 Nausea: Secondary | ICD-10-CM

## 2022-03-28 DIAGNOSIS — F418 Other specified anxiety disorders: Secondary | ICD-10-CM

## 2022-03-28 MED ORDER — ONDANSETRON 4 MG PO TBDP: ORAL_TABLET | ORAL | 1 refills | Status: DC

## 2022-03-28 MED ORDER — CITALOPRAM HYDROBROMIDE 10 MG PO TABS: 10.0000 mg | ORAL_TABLET | Freq: Every day | ORAL | 0 refills | Status: DC

## 2022-03-30 NOTE — Progress Notes (Unsigned)
Subjective:  Patient ID: Angela Powers, female    DOB: [birth year]  Age: 32 y.o. MRN: [id number]  No chief complaint on file.    History of Present illness: Pt presents for follow-up of chronic medical conditions of Type 2 DM, Vit D deficiency, and ADHD. Heathr denies any acute medical problems today.    ADHD, follow-up: Pt has a history of ADHD for several years. Current treatment includes Methylphenidate 30 mg QD.    Vitamin D Def follow up: Management since that visit includes Vitamin D 50,000 units weekly. She reports excellent compliance with treatment. She is not having side effects.   Diabetes Mellitus Type II, Follow-up  Lab Results  Component Value Date   HGBA1C 5.8 (H) 12/28/2021   HGBA1C 6.1 (H) 09/27/2021   HGBA1C 6.2 (H) 02/19/2021   Wt Readings from Last 3 Encounters:  12/28/21 214 lb (97.1 kg)  09/27/21 218 lb (98.9 kg)  05/20/21 226 lb (102.5 kg)   Last seen for diabetes 3 months ago.  Management  includes metformin 500 mg OD and Ozempic 1 mg injection weekly. She reports excellent compliance with treatment. She is not having side effects.   Most Recent Eye Exam: overdue Current exercise: none Current diet habits: in general, a "healthy" diet    Anxiety with Depression, Follow-up:   Current treatment includes Citalopram 30 mg QD.        She reports excellent compliance with treatment. She is not having side effects.          She reports excellent tolerance of treatment.   Lipid/Cholesterol, Follow-up                               She was last seen for this 3 months ago.  Management includes Crestor 10 mg OD.  She reports excellent compliance with treatment. She is not having side effects.   The ASCVD Risk score (Arnett DK, et al., 2019) failed to calculate for the following reasons:   The 2019 ASCVD risk score is only valid for ages 21 to 8   Pertinent Labs: Lab Results  Component Value Date   CHOL 159 12/28/2021   HDL 53  12/28/2021   LDLCALC 83 12/28/2021   TRIG 131 12/28/2021   CHOLHDL 3.0 12/28/2021   Lab Results  Component Value Date   NA 138 12/28/2021   K 4.6 12/28/2021   CREATININE 0.58 12/28/2021   EGFR 124 12/28/2021   GFRNONAA >60 02/27/2018   GLUCOSE 73 12/28/2021        Current Outpatient Medications on File Prior to Visit  Medication Sig Dispense Refill   citalopram (CELEXA) 10 MG tablet Take 1 tablet (10 mg total) by mouth daily. Take with Citalopram 20 mg to equal 30 mg. 90 tablet 0   citalopram (CELEXA) 20 MG tablet Take 1 tablet (20 mg total) by mouth daily. 90 tablet 1   cyanocobalamin (VITAMIN B12) 1000 MCG/ML injection Inject 1 mL (1,000 mcg total) into the muscle once a week. Inject 1 mL (1,000 mcg total) into muscle once weekly for 4 weeks then every 30 days 4 mL 2   dextromethorphan-guaiFENesin (MUCINEX DM) 30-600 MG 12hr tablet Take 1 tablet by mouth 2 (two) times daily. 20 tablet 0   metFORMIN (GLUCOPHAGE-XR) 500 MG 24 hr tablet TAKE 2 TABLETS BY MOUTH EVERY DAY WITH BREAKFAST 180 tablet 2   methylphenidate (METADATE CD) 30 MG CR capsule Take 1 capsule (  30 mg total) by mouth every morning. 30 capsule 0   ondansetron (ZOFRAN-ODT) 4 MG disintegrating tablet TAKE 1 TABLET BY MOUTH EVERY 8 HOURS AS NEEDED FOR NAUSEA AND VOMITING 30 tablet 1   rosuvastatin (CRESTOR) 10 MG tablet TAKE 1 TABLET BY MOUTH EVERY DAY 90 tablet 1   Semaglutide, 1 MG/DOSE, 4 MG/3ML SOPN Inject 1 mg as directed once a week. 3 mL 0   Semaglutide, 2 MG/DOSE, 8 MG/3ML SOPN Inject 2 mg as directed once a week. 3 mL 1   Vitamin D, Ergocalciferol, (DRISDOL) 1.25 MG (50000 UNIT) CAPS capsule TAKE 1 CAPSULE BY MOUTH TWICE WEEKLY 24 capsule 0   No current facility-administered medications on file prior to visit.   Past Medical History:  Diagnosis Date   Allergy    Anxiety    Depression    Diabetes mellitus without complication ([place])    Migraine headache    Nephrolithiasis    Past Surgical History:  Procedure  Laterality Date   CESAREAN SECTION MULTI-GESTATIONAL N/A 02/26/2018   Procedure: CESAREAN SECTION MULTI-GESTATIONAL;  Surgeon: Linda Hedges, DO;  Location: [facility];  Service: Obstetrics;  Laterality: N/A;    Family History  Problem Relation Age of Onset   Mental illness Mother    Hypertension Maternal Grandmother    Breast cancer Maternal Grandmother    Social History   Socioeconomic History   Marital status: Married    Spouse name: Not on file   Number of children: 2   Years of education: Not on file   Highest education level: Not on file  Occupational History   Occupation: 1rst grade teacher  Tobacco Use   Smoking status: Never   Smokeless tobacco: Never  Vaping Use   Vaping Use: Never used  Substance and Sexual Activity   Alcohol use: No   Drug use: No   Sexual activity: Not Currently    Birth control/protection: None  Other Topics Concern   Not on file  Social History Narrative   Not on file   Social Determinants of Health   Financial Resource Strain: Low Risk  (09/27/2021)   Overall Financial Resource Strain (CARDIA)    Difficulty of Paying Living Expenses: Not hard at all  Food Insecurity: No Food Insecurity (09/27/2021)   Hunger Vital Sign    Worried About Running Out of Food in the Last Year: Never true    Ran Out of Food in the Last Year: Never true  Transportation Needs: No Transportation Needs (09/27/2021)   PRAPARE - Hydrologist (Medical): No    Lack of Transportation (Non-Medical): No  Physical Activity: Inactive (09/27/2021)   Exercise Vital Sign    Days of Exercise per Week: 0 days    Minutes of Exercise per Session: 0 min  Stress: No Stress Concern Present (09/27/2021)   [place]    Feeling of Stress : Not at all  Social Connections: Moderately Isolated (09/27/2021)   Social Connection and Isolation Panel [NHANES]    Frequency of Communication  with Friends and Family: More than three times a week    Frequency of Social Gatherings with Friends and Family: More than three times a week    Attends Religious Services: Never    Marine scientist or Organizations: No    Attends Archivist Meetings: Never    Marital Status: Married    Review of Systems   Objective:  There were  no vitals taken for this visit.     03/08/2022    9:33 AM 12/28/2021   11:08 AM 09/27/2021    3:19 PM  BP/Weight  Systolic BP 0000000 [id number]  Diastolic BP 77 68 74  Wt. (Lbs)  214 218  BMI  32.54 kg/m2 33.15 kg/m2    Physical Exam  Diabetic Foot Exam - Simple   No data filed        12/28/2021   11:11 AM 09/27/2021    3:23 PM 05/20/2021   10:03 AM 02/19/2021    7:55 AM 11/13/2020    7:57 AM  Depression screen PHQ 2/9  Decreased Interest 0 0 0 0 0  Down, Depressed, Hopeless 0 0 0 0 0  PHQ - 2 Score 0 0 0 0 0  Altered sleeping 0 0 0 3 0  Tired, decreased energy 0 3 0 0 0  Change in appetite 0 0 0 0 0  Feeling bad or failure about yourself  0 0 0 0 0  Trouble concentrating 0 1 0 0 0  Moving slowly or fidgety/restless 0 0 0 0 0  Suicidal thoughts 0 0 0 0 0  PHQ-9 Score 0 4 0 3 0  Difficult doing work/chores Not difficult at all Not difficult at all Not difficult at all Not difficult at all Not difficult at all       06/02/2019    9:24 PM 05/05/2020    3:28 PM 11/13/2020    7:57 AM 09/27/2021    3:23 PM 12/28/2021   11:11 AM  Fall Risk  Falls in the past year?  0 0 0 0  Was there an injury with Fall?  0 0 0 0  Fall Risk Category Calculator  0 0 0 0  Fall Risk Category (Retired)  Low Low Low Low  (RETIRED) Patient Fall Risk Level Low fall risk Low fall risk Low fall risk Low fall risk Low fall risk  Patient at Risk for Falls Due to  No Fall Risks No Fall Risks No Fall Risks No Fall Risks  Fall risk Follow up  Falls evaluation completed Falls evaluation completed Falls evaluation completed Falls evaluation completed    Lab  Results  Component Value Date   WBC 10.7 12/28/2021   HGB 10.9 (L) 12/28/2021   HCT 35.3 12/28/2021   PLT 521 (H) 12/28/2021   GLUCOSE 73 12/28/2021   CHOL 159 12/28/2021   TRIG 131 12/28/2021   HDL 53 12/28/2021   LDLCALC 83 12/28/2021   ALT 14 12/28/2021   AST 17 12/28/2021   NA 138 12/28/2021   K 4.6 12/28/2021   CL 101 12/28/2021   CREATININE 0.58 12/28/2021   BUN 8 12/28/2021   CO2 24 12/28/2021   TSH 0.749 05/20/2021   HGBA1C 5.8 (H) 12/28/2021   MICROALBUR 30 04/07/2020      Assessment & Plan:   There are no diagnoses linked to this encounter.   Follow-up: No follow-ups on file.  An After Visit Summary was printed and given to the patient.  I, Neil Crouch have reviewed all documentation for this visit. The documentation on 03/30/22   for the exam, diagnosis, procedures, and orders are all accurate and complete.    Neil Crouch, DNP, [facility] [phone]

## 2022-03-31 ENCOUNTER — Ambulatory Visit: Payer: BC Managed Care – PPO | Admitting: Nurse Practitioner

## 2022-03-31 VITALS — BP 110/70 | HR 76 | Temp 97.2°F | Resp 16 | Ht 68.0 in | Wt 214.0 lb

## 2022-03-31 DIAGNOSIS — E782 Mixed hyperlipidemia: Secondary | ICD-10-CM

## 2022-03-31 DIAGNOSIS — F411 Generalized anxiety disorder: Secondary | ICD-10-CM | POA: Diagnosis not present

## 2022-03-31 DIAGNOSIS — E559 Vitamin D deficiency, unspecified: Secondary | ICD-10-CM | POA: Diagnosis not present

## 2022-03-31 DIAGNOSIS — Z6836 Body mass index (BMI) 36.0-36.9, adult: Secondary | ICD-10-CM | POA: Diagnosis not present

## 2022-03-31 DIAGNOSIS — E1165 Type 2 diabetes mellitus with hyperglycemia: Secondary | ICD-10-CM

## 2022-03-31 DIAGNOSIS — F909 Attention-deficit hyperactivity disorder, unspecified type: Secondary | ICD-10-CM

## 2022-03-31 NOTE — Assessment & Plan Note (Signed)
Well controlled but patient wants to increase the dose of Metadate 30 mg to 40 mg from next refill. Last refill 03/25/2022

## 2022-03-31 NOTE — Assessment & Plan Note (Signed)
PHQ 0, GAD 0 Well controlled Continue Celexa 30 mg OD

## 2022-03-31 NOTE — Assessment & Plan Note (Signed)
Continue taking Vitamin D two times weekly Discussed Vitamin D rich diet and encouraged to take Vitamin D rich diet.

## 2022-03-31 NOTE — Patient Instructions (Addendum)
Will call you with lab results Follow up in 3 months  Vitamin D Deficiency Vitamin D deficiency is when your body does not have enough vitamin D. Vitamin D is important because: It helps your body use certain minerals. It helps to keep your bones healthy. It lessens irritation and swelling (inflammation). It helps the body's defense system (immune system) work better. Not getting enough vitamin D can make your bones soft. What are the causes? Not eating enough foods that have vitamin D in them. Not getting enough sun. Having diseases that make it hard for your body to take in vitamin D. Having had part of your stomach or part of your small intestine taken out. What increases the risk? Being an older adult. Not spending much time outdoors. Living in a long-term care center. Having dark skin. Taking certain medicines. Being overweight or very overweight (obese). Having long-term (chronic) kidney or liver disease. What are the signs or symptoms? In mild cases, there may be no symptoms. If the condition is very bad, symptoms may include: Bone pain. Muscle pain. Not being able to walk normally. Bones that break easily. Joint pain. How is this treated? Treatment may include taking supplements as told by your doctor. Your doctor will tell you what dose is best for you. This may include taking: Vitamin D. Calcium. Follow these instructions at home: Eating and drinking Eat foods that have vitamin D in them, such as: Dairy products, cereals, or juices that have vitamin D added to them (are fortified). Check the label. Fish, such as salmon or trout. Eggs. The vitamin D is in the yolk. Mushrooms that were treated with UV light. Beef liver. The items listed above may not be a complete list of foods and beverages you can eat and drink. Contact a dietitian for more information. General instructions Take over-the-counter and prescription medicines only as told by your doctor. Take  supplements only as told by your doctor. Get sunlight in a safe way. Do not use a tanning bed. Stay at a healthy weight. Lose weight if you need to. Keep all follow-up visits. How is this prevented? Eating foods that naturally have vitamin D in them. Eating or drinking foods and drinks that have vitamin D added to them, such as cereals, juices, and milk. Taking vitamin D or a multivitamin that has vitamin D in it. Being in the sun. Your body makes vitamin D when your skin gets sunlight. Contact a doctor if: Your symptoms do not go away. You feel like you may vomit (nauseous). You vomit. You poop less often than normal, or you have trouble pooping (constipation). Summary Vitamin D deficiency is when your body does not have enough vitamin D. Vitamin D helps to keep your bones healthy. This condition is often treated by taking a supplement. Your doctor will tell you what dose is best for you. This information is not intended to replace advice given to you by your health care provider. Make sure you discuss any questions you have with your health care provider. Document Revised: 10/02/2020 Document Reviewed: 10/02/2020 Elsevier Patient Education  [place].

## 2022-03-31 NOTE — Assessment & Plan Note (Signed)
Well controlled  Continue metformin 500 mg two tabs daily  A1C will be checked today Continue taking low carb diet and exercise

## 2022-03-31 NOTE — Assessment & Plan Note (Signed)
Well control Continue Crestor 10 mg Lipid profile will be checked today  Nutrition: Stressed importance of moderation in sodium intake, saturated fat and cholesterol, caloric balance, sufficient intake of complex carbohydrates, fiber, calcium and iron.   Exercise: Stressed the importance of regular exercise.

## 2022-03-31 NOTE — Assessment & Plan Note (Signed)
Continue Ozempic 1 mg weekly Continue heart healthy diet and regular aerobic exercise 150 min/week Labs will be drawn today

## 2022-04-01 ENCOUNTER — Other Ambulatory Visit: Payer: Self-pay | Admitting: Nurse Practitioner

## 2022-04-01 DIAGNOSIS — R7989 Other specified abnormal findings of blood chemistry: Secondary | ICD-10-CM

## 2022-04-01 DIAGNOSIS — D509 Iron deficiency anemia, unspecified: Secondary | ICD-10-CM

## 2022-04-01 LAB — CBC WITH DIFFERENTIAL/PLATELET
Basophils Absolute: 0 10*3/uL (ref 0.0–0.2)
Basos: 0 %
EOS (ABSOLUTE): 0.1 10*3/uL (ref 0.0–0.4)
Eos: 1 %
Hematocrit: 32.6 % — ABNORMAL LOW (ref 34.0–46.6)
Hemoglobin: 10 g/dL — ABNORMAL LOW (ref 11.1–15.9)
Immature Grans (Abs): 0 10*3/uL (ref 0.0–0.1)
Immature Granulocytes: 0 %
Lymphocytes Absolute: 2.5 10*3/uL (ref 0.7–3.1)
Lymphs: 23 %
MCH: 22.6 pg — ABNORMAL LOW (ref 26.6–33.0)
MCHC: 30.7 g/dL — ABNORMAL LOW (ref 31.5–35.7)
MCV: 74 fL — ABNORMAL LOW (ref 79–97)
Monocytes Absolute: 0.7 10*3/uL (ref 0.1–0.9)
Monocytes: 7 %
Neutrophils Absolute: 7.3 10*3/uL — ABNORMAL HIGH (ref 1.4–7.0)
Neutrophils: 69 %
Platelets: 475 10*3/uL — ABNORMAL HIGH (ref 150–450)
RBC: 4.43 x10E6/uL (ref 3.77–5.28)
RDW: 15.3 % (ref 11.7–15.4)
WBC: 10.6 10*3/uL (ref 3.4–10.8)

## 2022-04-01 LAB — LIPID PANEL
Chol/HDL Ratio: 2.8 ratio (ref 0.0–4.4)
Cholesterol, Total: 165 mg/dL (ref 100–199)
HDL: 58 mg/dL (ref >39)
LDL Chol Calc (NIH): 88 mg/dL (ref 0–99)
Triglycerides: 105 mg/dL (ref 0–149)
VLDL Cholesterol Cal: 19 mg/dL (ref 5–40)

## 2022-04-01 LAB — COMPREHENSIVE METABOLIC PANEL
ALT: 12 IU/L (ref 0–32)
AST: 16 IU/L (ref 0–40)
Albumin/Globulin Ratio: 1.3 (ref 1.2–2.2)
Albumin: 4.1 g/dL (ref 3.9–4.9)
Alkaline Phosphatase: 80 IU/L (ref 44–121)
BUN/Creatinine Ratio: 18 (ref 9–23)
BUN: 11 mg/dL (ref 6–20)
Bilirubin Total: 0.3 mg/dL (ref 0.0–1.2)
CO2: 21 mmol/L (ref 20–29)
Calcium: 9.3 mg/dL (ref 8.7–10.2)
Chloride: 102 mmol/L (ref 96–106)
Creatinine, Ser: 0.61 mg/dL (ref 0.57–1.00)
Globulin, Total: 3.1 g/dL (ref 1.5–4.5)
Glucose: 92 mg/dL (ref 70–99)
Potassium: 5.2 mmol/L (ref 3.5–5.2)
Sodium: 138 mmol/L (ref 134–144)
Total Protein: 7.2 g/dL (ref 6.0–8.5)
eGFR: 122 mL/min/{1.73_m2} (ref >59)

## 2022-04-01 LAB — CARDIOVASCULAR RISK ASSESSMENT

## 2022-04-01 LAB — TSH: TSH: 0.767 u[IU]/mL (ref 0.450–4.500)

## 2022-04-01 LAB — HEMOGLOBIN A1C
Est. average glucose Bld gHb Est-mCnc: 128 mg/dL
Hgb A1c MFr Bld: 6.1 % — ABNORMAL HIGH (ref 4.8–5.6)

## 2022-04-01 LAB — VITAMIN D 25 HYDROXY (VIT D DEFICIENCY, FRACTURES): Vit D, 25-Hydroxy: 93.3 ng/mL (ref 30.0–100.0)

## 2022-04-01 MED ORDER — IRON (FERROUS SULFATE) 325 (65 FE) MG PO TABS: 325.0000 mg | ORAL_TABLET | Freq: Every day | ORAL | 1 refills | Status: DC

## 2022-04-05 ENCOUNTER — Inpatient Hospital Stay: Payer: BC Managed Care – PPO | Attending: Oncology | Admitting: Oncology

## 2022-04-05 ENCOUNTER — Inpatient Hospital Stay: Payer: BC Managed Care – PPO

## 2022-04-05 ENCOUNTER — Encounter: Payer: Self-pay | Admitting: Oncology

## 2022-04-05 ENCOUNTER — Other Ambulatory Visit: Payer: Self-pay | Admitting: Oncology

## 2022-04-05 VITALS — BP 136/71 | HR 70 | Temp 99.1°F | Resp 16 | Ht 68.0 in | Wt 212.6 lb

## 2022-04-05 DIAGNOSIS — D509 Iron deficiency anemia, unspecified: Secondary | ICD-10-CM | POA: Diagnosis not present

## 2022-04-05 DIAGNOSIS — R7989 Other specified abnormal findings of blood chemistry: Secondary | ICD-10-CM

## 2022-04-05 DIAGNOSIS — F32A Depression, unspecified: Secondary | ICD-10-CM | POA: Insufficient documentation

## 2022-04-05 DIAGNOSIS — E119 Type 2 diabetes mellitus without complications: Secondary | ICD-10-CM | POA: Insufficient documentation

## 2022-04-05 DIAGNOSIS — Z7984 Long term (current) use of oral hypoglycemic drugs: Secondary | ICD-10-CM | POA: Insufficient documentation

## 2022-04-05 DIAGNOSIS — D500 Iron deficiency anemia secondary to blood loss (chronic): Secondary | ICD-10-CM

## 2022-04-05 DIAGNOSIS — Z79899 Other long term (current) drug therapy: Secondary | ICD-10-CM | POA: Insufficient documentation

## 2022-04-05 LAB — CMP (CANCER CENTER ONLY)
ALT: 13 U/L (ref 0–44)
AST: 17 U/L (ref 15–41)
Albumin: 3.9 g/dL (ref 3.5–5.0)
Alkaline Phosphatase: 69 U/L (ref 38–126)
Anion gap: 8 (ref 5–15)
BUN: 12 mg/dL (ref 6–20)
CO2: 26 mmol/L (ref 22–32)
Calcium: 8.6 mg/dL — ABNORMAL LOW (ref 8.9–10.3)
Chloride: 104 mmol/L (ref 98–111)
Creatinine: 0.6 mg/dL (ref 0.44–1.00)
GFR, Estimated: >60 mL/min (ref >60)
Glucose, Bld: 80 mg/dL (ref 70–99)
Potassium: 3.8 mmol/L (ref 3.5–5.1)
Sodium: 138 mmol/L (ref 135–145)
Total Bilirubin: 0.3 mg/dL (ref 0.3–1.2)
Total Protein: 7.8 g/dL (ref 6.5–8.1)

## 2022-04-05 LAB — VITAMIN B12: Vitamin B-12: 466 pg/mL (ref 180–914)

## 2022-04-05 LAB — CBC AND DIFFERENTIAL
HCT: 33 — AB (ref 36–46)
Hemoglobin: 10.4 — AB (ref 12.0–16.0)
Neutrophils Absolute: 7.62
Platelets: 473 10*3/uL — AB (ref 150–400)
WBC: 11.9

## 2022-04-05 LAB — CBC: RBC: 4.53 (ref 3.87–5.11)

## 2022-04-05 LAB — IRON AND TIBC
Iron: 28 ug/dL (ref 28–170)
Saturation Ratios: 5 % — ABNORMAL LOW (ref 10.4–31.8)
TIBC: 550 ug/dL — ABNORMAL HIGH (ref 250–450)
UIBC: 522 ug/dL

## 2022-04-05 LAB — FERRITIN: Ferritin: 3 ng/mL — ABNORMAL LOW (ref 11–307)

## 2022-04-05 LAB — FOLATE: Folate: 10.1 ng/mL (ref >5.9)

## 2022-04-05 NOTE — Progress Notes (Signed)
[facility]  [address] [place],  [place]  [address] [phone]  Clinic Day:  04/05/2022  Referring physician: Neil Crouch, FNP   HISTORY OF PRESENT ILLNESS:  The patient is a 32 y.o. female  who I was asked to consult upon for having elevated platelets.  Recent labs at her primary care office showed an elevated platelet count of 475.  However, she was also anemic, with a hemoglobin of 10 and a low MCV of 74.  The patient was told that she is likely iron deficient.  She claims her menstrual cycles usually last 7 days; however, she denies that they are particularly heavy.  She denies having other overt forms of blood loss.  Of note, this patient has had extreme cravings for ice for numerous months.  To her knowledge, there is no family history of anemia or other hematologic disorders.  PAST MEDICAL HISTORY:   Past Medical History:  Diagnosis Date   Allergy    Anxiety    Depression    Diabetes mellitus without complication (Accident)    Migraine headache    Nephrolithiasis     PAST SURGICAL HISTORY:   Past Surgical History:  Procedure Laterality Date   CESAREAN SECTION MULTI-GESTATIONAL N/A 02/26/2018   Procedure: CESAREAN SECTION MULTI-GESTATIONAL;  Surgeon: Linda Hedges, DO;  Location: [facility];  Service: Obstetrics;  Laterality: N/A;    CURRENT MEDICATIONS:   Current Outpatient Medications  Medication Sig Dispense Refill   citalopram (CELEXA) 10 MG tablet Take 1 tablet (10 mg total) by mouth daily. Take with Citalopram 20 mg to equal 30 mg. 90 tablet 0   citalopram (CELEXA) 20 MG tablet Take 1 tablet (20 mg total) by mouth daily. 90 tablet 1   cyanocobalamin (VITAMIN B12) 1000 MCG/ML injection Inject 1 mL (1,000 mcg total) into the muscle once a week. Inject 1 mL (1,000 mcg total) into muscle once weekly for 4 weeks then every 30 days 4 mL 2   Iron, Ferrous Sulfate, 325 (65 Fe) MG TABS Take 325 mg by mouth daily. 90 tablet 1    metFORMIN (GLUCOPHAGE-XR) 500 MG 24 hr tablet TAKE 2 TABLETS BY MOUTH EVERY DAY WITH BREAKFAST 180 tablet 2   methylphenidate (METADATE CD) 30 MG CR capsule Take 1 capsule (30 mg total) by mouth every morning. 30 capsule 0   ondansetron (ZOFRAN-ODT) 4 MG disintegrating tablet TAKE 1 TABLET BY MOUTH EVERY 8 HOURS AS NEEDED FOR NAUSEA AND VOMITING 30 tablet 1   rosuvastatin (CRESTOR) 10 MG tablet TAKE 1 TABLET BY MOUTH EVERY DAY 90 tablet 1   Semaglutide, 1 MG/DOSE, 4 MG/3ML SOPN Inject 1 mg as directed once a week. 3 mL 0   Semaglutide, 2 MG/DOSE, 8 MG/3ML SOPN Inject 2 mg as directed once a week. (Patient not taking: Reported on 03/31/2022) 3 mL 1   Vitamin D, Ergocalciferol, (DRISDOL) 1.25 MG (50000 UNIT) CAPS capsule TAKE 1 CAPSULE BY MOUTH TWICE WEEKLY 24 capsule 0   No current facility-administered medications for this visit.    ALLERGIES:  No Known Allergies  FAMILY HISTORY:   Family History  Problem Relation Age of Onset   Mental illness Mother    Hypertension Maternal Grandmother    Breast cancer Maternal Grandmother     SOCIAL HISTORY:  The patient was born and raised in [place].  She currently lives in [place] with her husband of 8 years.  They have 2 twin daughters.  She is an Automotive engineer.  There is no history of alcoholism or tobacco use.  REVIEW OF SYSTEMS:  Review of Systems  Constitutional:  Negative for fatigue and fever.  HENT:   Negative for hearing loss and sore throat.   Eyes:  Negative for eye problems.  Respiratory:  Positive for cough. Negative for chest tightness and hemoptysis.   Cardiovascular:  Negative for chest pain and palpitations.  Gastrointestinal:  Negative for abdominal distention, abdominal pain, blood in stool, constipation, diarrhea, nausea and vomiting.  Endocrine: Negative for hot flashes.  Genitourinary:  Negative for difficulty urinating, dysuria, frequency, hematuria and nocturia.   Musculoskeletal:  Negative for  arthralgias, back pain, gait problem and myalgias.  Skin: Negative.  Negative for itching and rash.  Neurological:  Positive for headaches. Negative for dizziness, extremity weakness, gait problem, light-headedness and numbness.  Hematological: Negative.   Psychiatric/Behavioral:  Positive for depression. Negative for suicidal ideas. The patient is nervous/anxious.     PHYSICAL EXAM:  Blood pressure 136/71, pulse 70, temperature 99.1 F (37.3 C), resp. rate 16, height 5\' 8"  (1.727 m), weight 212 lb 9.6 oz (96.4 kg), SpO2 99 %. Wt Readings from Last 3 Encounters:  04/05/22 212 lb 9.6 oz (96.4 kg)  03/31/22 214 lb (97.1 kg)  12/28/21 214 lb (97.1 kg)   Body mass index is 32.33 kg/m. Performance status (ECOG): 0 - Asymptomatic Physical Exam Constitutional:      Appearance: Normal appearance. She is not ill-appearing.  HENT:     Mouth/Throat:     Mouth: Mucous membranes are moist.     Pharynx: Oropharynx is clear. No oropharyngeal exudate or posterior oropharyngeal erythema.  Cardiovascular:     Rate and Rhythm: Normal rate and regular rhythm.     Heart sounds: No murmur heard.    No friction rub. No gallop.  Pulmonary:     Effort: Pulmonary effort is normal. No respiratory distress.     Breath sounds: Normal breath sounds. No wheezing, rhonchi or rales.  Abdominal:     General: Bowel sounds are normal. There is no distension.     Palpations: Abdomen is soft. There is no mass.     Tenderness: There is no abdominal tenderness.  Musculoskeletal:        General: No swelling.     Right lower leg: No edema.     Left lower leg: No edema.  Lymphadenopathy:     Cervical: No cervical adenopathy.     Upper Body:     Right upper body: No supraclavicular or axillary adenopathy.     Left upper body: No supraclavicular or axillary adenopathy.     Lower Body: No right inguinal adenopathy. No left inguinal adenopathy.  Skin:    General: Skin is warm.     Coloration: Skin is not jaundiced.      Findings: No lesion or rash.  Neurological:     General: No focal deficit present.     Mental Status: She is alert and oriented to person, place, and time. Mental status is at baseline.  Psychiatric:        Mood and Affect: Mood normal.        Behavior: Behavior normal.        Thought Content: Thought content normal.     LABS:      Latest Ref Rng & Units 04/05/2022   12:00 AM 03/31/2022    8:30 AM 12/28/2021   11:31 AM  CBC  WBC  11.9     10.6  10.7  Hemoglobin 12.0 - 16.0 10.4     10.0  10.9   Hematocrit 36 - 46 33     32.6  35.3   Platelets 150 - 400 K/uL 473     475  521      This result is from an external source.      Latest Ref Rng & Units 04/05/2022    2:48 PM 03/31/2022    8:30 AM 12/28/2021   11:31 AM  CMP  Glucose 70 - 99 mg/dL 80  92  73   BUN 6 - 20 mg/dL 12  11  8    Creatinine 0.44 - 1.00 mg/dL 0.60  0.61  0.58   Sodium 135 - 145 mmol/L 138  138  138   Potassium 3.5 - 5.1 mmol/L 3.8  5.2  4.6   Chloride 98 - 111 mmol/L 104  102  101   CO2 22 - 32 mmol/L 26  21  24    Calcium 8.9 - 10.3 mg/dL 8.6  9.3  9.2   Total Protein 6.5 - 8.1 g/dL 7.8  7.2  7.1   Total Bilirubin 0.3 - 1.2 mg/dL 0.3  0.3  0.2   Alkaline Phos 38 - 126 U/L 69  80  82   AST 15 - 41 U/L 17  16  17    ALT 0 - 44 U/L 13  12  14      Latest Reference Range & Units 04/05/22 14:48  Iron 28 - 170 ug/dL 28  UIBC ug/dL 522  TIBC 250 - 450 ug/dL 550 (H)  Saturation Ratios 10.4 - 31.8 % 5 (L)  Ferritin 11 - 307 ng/mL 3 (L)  Vitamin B12 180 - 914 pg/mL 466  (H): Data is abnormally high (L): Data is abnormally low   ASSESSMENT & PLAN:  A 32 y.o. female who I was asked to consult upon for elevated platelets, which clearly appear to be due to her underlying iron deficiency anemia.  Based upon this, I will arrange for her to receive IV iron over these next few weeks to rapidly replenish her iron stores and normalize both her hemoglobin and platelets.  She knows it will be important for her to  undergo a Pap smear/pelvic exam to ensure there is no abnormal gynecologic disease behind her iron deficiency anemia.  Otherwise, I will see her back in 3 months to reassess her iron and hemoglobin levels to see how well they responded to her upcoming IV iron.  The patient understands all the plans discussed today and is in agreement with them.  I do appreciate Neil Crouch, FNP for his new consult.   Irem Stoneham Macarthur Critchley, MD

## 2022-04-07 ENCOUNTER — Telehealth: Payer: BC Managed Care – PPO | Admitting: Physician Assistant

## 2022-04-07 DIAGNOSIS — K0889 Other specified disorders of teeth and supporting structures: Secondary | ICD-10-CM

## 2022-04-07 MED FILL — Ferumoxytol Inj 510 MG/17ML (30 MG/ML) (Elemental Fe): INTRAVENOUS | Qty: 17 | Status: AC

## 2022-04-08 ENCOUNTER — Ambulatory Visit: Payer: BC Managed Care – PPO

## 2022-04-08 ENCOUNTER — Inpatient Hospital Stay: Payer: BC Managed Care – PPO

## 2022-04-08 VITALS — BP 127/67 | HR 70 | Temp 98.5°F | Resp 16 | Ht 68.0 in | Wt 213.1 lb

## 2022-04-08 DIAGNOSIS — Z79899 Other long term (current) drug therapy: Secondary | ICD-10-CM | POA: Diagnosis not present

## 2022-04-08 DIAGNOSIS — Z7984 Long term (current) use of oral hypoglycemic drugs: Secondary | ICD-10-CM | POA: Diagnosis not present

## 2022-04-08 DIAGNOSIS — D509 Iron deficiency anemia, unspecified: Secondary | ICD-10-CM | POA: Diagnosis not present

## 2022-04-08 DIAGNOSIS — E119 Type 2 diabetes mellitus without complications: Secondary | ICD-10-CM | POA: Diagnosis not present

## 2022-04-08 DIAGNOSIS — F32A Depression, unspecified: Secondary | ICD-10-CM | POA: Diagnosis not present

## 2022-04-08 DIAGNOSIS — D500 Iron deficiency anemia secondary to blood loss (chronic): Secondary | ICD-10-CM

## 2022-04-08 MED ORDER — SODIUM CHLORIDE 0.9 % IV SOLN: Freq: Once | INTRAVENOUS | Status: AC

## 2022-04-08 MED ORDER — SODIUM CHLORIDE 0.9 % IV SOLN
510.0000 mg | Freq: Once | INTRAVENOUS | Status: AC
  Administered 2022-04-08: 510 mg via INTRAVENOUS
  Filled 2022-04-08: qty 510

## 2022-04-08 MED ORDER — NAPROXEN 500 MG PO TABS: 500.0000 mg | ORAL_TABLET | Freq: Two times a day (BID) | ORAL | 0 refills | Status: DC

## 2022-04-08 NOTE — Patient Instructions (Signed)

## 2022-04-08 NOTE — Progress Notes (Signed)
E-Visit for Dental Pain  We are sorry that you are not feeling well.  Here is how we plan to help!  Based on what you have shared with me in the questionnaire, it sounds like you have a dental infection/abscess causing pain.  Naprosyn 500mg  2 times a day for 7 days for discomfort  You can also use ground clove. Take ground clove and place a small amount on a plate. Add a few drops of water to the ground clove to make a paste. You can then use a q-tip to place the paste around the tooth that is causing the issue and on the gumline in that area as well.   It is imperative that you see a dentist within 10 days of this eVisit to determine the cause of the dental pain and be sure it is adequately treated  A toothache or tooth pain is caused when the nerve in the root of a tooth or surrounding a tooth is irritated. Dental (tooth) infection, decay, injury, or loss of a tooth are the most common causes of dental pain. Pain may also occur after an extraction (tooth is pulled out). Pain sometimes originates from other areas and radiates to the jaw, thus appearing to be tooth pain.Bacteria growing inside your mouth can contribute to gum disease and dental decay, both of which can cause pain. A toothache occurs from inflammation of the central portion of the tooth called pulp. The pulp contains nerve endings that are very sensitive to pain. Inflammation to the pulp or pulpitis may be caused by dental cavities, trauma, and infection.    HOME CARE:   For toothaches: Over-the-counter pain medications such as acetaminophen or ibuprofen may be used. Take these as directed on the package while you arrange for a dental appointment. Avoid very cold or hot foods, because they may make the pain worse. You may get relief from biting on a cotton ball soaked in oil of cloves. You can get oil of cloves at most drug stores.  For jaw pain:  Aspirin may be helpful for problems in the joint of the jaw in adults. If pain  happens every time you open your mouth widely, the temporomandibular joint (TMJ) may be the source of the pain. Yawning or taking a large bite of food may worsen the pain. An appointment with your doctor or dentist will help you find the cause.     GET HELP RIGHT AWAY IF:  You have a high fever or chills If you have had a recent head or face injury and develop headache, light headedness, nausea, vomiting, or other symptoms that concern you after an injury to your face or mouth, you could have a more serious injury in addition to your dental injury. A facial rash associated with a toothache: This condition may improve with medication. Contact your doctor for them to decide what is appropriate. Any jaw pain occurring with chest pain: Although jaw pain is most commonly caused by dental disease, it is sometimes referred pain from other areas. People with heart disease, especially people who have had stents placed, people with diabetes, or those who have had heart surgery may have jaw pain as a symptom of heart attack or angina. If your jaw or tooth pain is associated with lightheadedness, sweating, or shortness of breath, you should see a doctor as soon as possible. Trouble swallowing or excessive pain or bleeding from gums: If you have a history of a weakened immune system, diabetes, or steroid use,  you may be more susceptible to infections. Infections can often be more severe and extensive or caused by unusual organisms. Dental and gum infections in people with these conditions may require more aggressive treatment. An abscess may need draining or IV antibiotics, for example.  MAKE SURE YOU   Understand these instructions. Will watch your condition. Will get help right away if you are not doing well or get worse.  Thank you for choosing an e-visit.  Your e-visit answers were reviewed by a board certified advanced clinical practitioner to complete your personal care plan. Depending upon the  condition, your plan could have included both over the counter or prescription medications.  Please review your pharmacy choice. Make sure the pharmacy is open so you can pick up prescription now. If there is a problem, you may contact your provider through [employer] and have the prescription routed to another pharmacy.  Your safety is important to [place]. If you have drug allergies check your prescription carefully.   For the next 24 hours you can use MyChart to ask questions about today's visit, request a non-urgent call back, or ask for a work or school excuse. You will get an email in the next two days asking about your experience. I hope that your e-visit has been valuable and will speed your recovery.  I have spent 5 minutes in review of e-visit questionnaire, review and updating patient chart, medical decision making and response to patient.   Mar Daring, PA-C

## 2022-04-12 MED FILL — Ferumoxytol Inj 510 MG/17ML (30 MG/ML) (Elemental Fe): INTRAVENOUS | Qty: 17 | Status: AC

## 2022-04-13 ENCOUNTER — Inpatient Hospital Stay: Payer: BC Managed Care – PPO | Attending: Oncology

## 2022-04-13 VITALS — BP 122/54 | HR 66 | Temp 98.5°F | Resp 16

## 2022-04-13 DIAGNOSIS — D509 Iron deficiency anemia, unspecified: Secondary | ICD-10-CM | POA: Insufficient documentation

## 2022-04-13 DIAGNOSIS — D500 Iron deficiency anemia secondary to blood loss (chronic): Secondary | ICD-10-CM

## 2022-04-13 MED ORDER — SODIUM CHLORIDE 0.9 % IV SOLN
510.0000 mg | Freq: Once | INTRAVENOUS | Status: AC
  Administered 2022-04-13: 510 mg via INTRAVENOUS
  Filled 2022-04-13: qty 17

## 2022-04-13 MED ORDER — SODIUM CHLORIDE 0.9 % IV SOLN: Freq: Once | INTRAVENOUS | Status: AC

## 2022-04-13 NOTE — Patient Instructions (Signed)

## 2022-04-20 ENCOUNTER — Other Ambulatory Visit: Payer: Self-pay

## 2022-04-20 MED ORDER — ROSUVASTATIN CALCIUM 10 MG PO TABS: 10.0000 mg | ORAL_TABLET | Freq: Every day | ORAL | 0 refills | Status: DC

## 2022-04-20 MED ORDER — VITAMIN D (ERGOCALCIFEROL) 1.25 MG (50000 UNIT) PO CAPS: 50000.0000 [IU] | ORAL_CAPSULE | ORAL | 0 refills | Status: DC

## 2022-04-23 ENCOUNTER — Encounter: Payer: Self-pay | Admitting: Physician Assistant

## 2022-04-29 ENCOUNTER — Other Ambulatory Visit: Payer: Self-pay

## 2022-04-29 DIAGNOSIS — F909 Attention-deficit hyperactivity disorder, unspecified type: Secondary | ICD-10-CM

## 2022-04-29 MED ORDER — METHYLPHENIDATE HCL ER (CD) 40 MG PO CPCR: 40.0000 mg | ORAL_CAPSULE | ORAL | 0 refills | Status: DC

## 2022-05-19 LAB — HM DIABETES EYE EXAM

## 2022-05-20 ENCOUNTER — Other Ambulatory Visit: Payer: Self-pay | Admitting: Family Medicine

## 2022-05-20 DIAGNOSIS — E1165 Type 2 diabetes mellitus with hyperglycemia: Secondary | ICD-10-CM

## 2022-05-20 MED ORDER — SEMAGLUTIDE (1 MG/DOSE) 4 MG/3ML ~~LOC~~ SOPN
1.0000 mg | PEN_INJECTOR | SUBCUTANEOUS | 0 refills | Status: DC
Start: 2022-05-20 — End: 2022-06-13

## 2022-05-28 ENCOUNTER — Other Ambulatory Visit: Payer: Self-pay | Admitting: Family Medicine

## 2022-05-28 DIAGNOSIS — F909 Attention-deficit hyperactivity disorder, unspecified type: Secondary | ICD-10-CM

## 2022-05-30 MED ORDER — METHYLPHENIDATE HCL ER (CD) 40 MG PO CPCR: 40.0000 mg | ORAL_CAPSULE | ORAL | 0 refills | Status: DC

## 2022-06-11 ENCOUNTER — Other Ambulatory Visit: Payer: Self-pay | Admitting: Physician Assistant

## 2022-06-11 DIAGNOSIS — E1165 Type 2 diabetes mellitus with hyperglycemia: Secondary | ICD-10-CM

## 2022-06-13 ENCOUNTER — Other Ambulatory Visit: Payer: Self-pay

## 2022-06-13 DIAGNOSIS — R110 Nausea: Secondary | ICD-10-CM

## 2022-06-13 MED ORDER — ONDANSETRON 4 MG PO TBDP
ORAL_TABLET | ORAL | 0 refills | Status: DC
Start: 2022-06-13 — End: 2022-07-25

## 2022-06-14 DIAGNOSIS — K136 Irritative hyperplasia of oral mucosa: Secondary | ICD-10-CM | POA: Diagnosis not present

## 2022-06-29 NOTE — Progress Notes (Unsigned)
[facility]  [address] [place],  [place]  [address] [phone]  Clinic Day:  06/30/2022  Referring physician: Marianne Sofia, PA-C   HISTORY OF PRESENT ILLNESS:  The patient is a 32 y.o. 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  She comes in today to reassess her labs after receiving a course of IV iron.  Overall, she feels much better since getting her IV iron.  Her ice cravings have dissipated.  Her menstrual cycles remain moderately heavy, but not as heavy and frequent as they were previously.  She denies having other overt forms of blood loss.   PHYSICAL EXAM:  Blood pressure 133/69, pulse 64, temperature 98.7 F (37.1 C), resp. rate 16, height 5\' 8"  (1.727 m), weight 219 lb 4.8 oz (99.5 kg), SpO2 97 %. Wt Readings from Last 3 Encounters:  06/30/22 219 lb 4.8 oz (99.5 kg)  04/08/22 213 lb 1 oz (96.6 kg)  04/05/22 212 lb 9.6 oz (96.4 kg)   Body mass index is 33.34 kg/m. Performance status (ECOG): 0 - Asymptomatic Physical Exam Constitutional:      Appearance: Normal appearance. She is not ill-appearing.  HENT:     Mouth/Throat:     Mouth: Mucous membranes are moist.     Pharynx: Oropharynx is clear. No oropharyngeal exudate or posterior oropharyngeal erythema.  Cardiovascular:     Rate and Rhythm: Normal rate and regular rhythm.     Heart sounds: No murmur heard.    No friction rub. No gallop.  Pulmonary:     Effort: Pulmonary effort is normal. No respiratory distress.     Breath sounds: Normal breath sounds. No wheezing, rhonchi or rales.  Abdominal:     General: Bowel sounds are normal. There is no distension.     Palpations: Abdomen is soft. There is no mass.     Tenderness: There is no abdominal tenderness.  Musculoskeletal:        General: No swelling.     Right lower leg: No edema.     Left lower leg: No edema.  Lymphadenopathy:     Cervical: No cervical adenopathy.     Upper Body:      Right upper body: No supraclavicular or axillary adenopathy.     Left upper body: No supraclavicular or axillary adenopathy.     Lower Body: No right inguinal adenopathy. No left inguinal adenopathy.  Skin:    General: Skin is warm.     Coloration: Skin is not jaundiced.     Findings: No lesion or rash.  Neurological:     General: No focal deficit present.     Mental Status: She is alert and oriented to person, place, and time. Mental status is at baseline.  Psychiatric:        Mood and Affect: Mood normal.        Behavior: Behavior normal.        Thought Content: Thought content normal.    LABS:      Latest Ref Rng & Units 06/30/2022   12:00 AM 04/05/2022   12:00 AM 03/31/2022    8:30 AM  CBC  WBC  10.8     11.9     10.6   Hemoglobin 12.0 - 16.0 13.3     10.4     10.0   Hematocrit 36 - 46 39     33     32.6   Platelets 150 - 400 K/uL 402  473     475      This result is from an external source.      Latest Ref Rng & Units 04/05/2022    2:48 PM 03/31/2022    8:30 AM 12/28/2021   11:31 AM  CMP  Glucose 70 - 99 mg/dL 80  92  73   BUN 6 - 20 mg/dL 12  11  8    Creatinine 0.44 - 1.00 mg/dL 0.98  1.19  1.47   Sodium 135 - 145 mmol/L 138  138  138   Potassium 3.5 - 5.1 mmol/L 3.8  5.2  4.6   Chloride 98 - 111 mmol/L 104  102  101   CO2 22 - 32 mmol/L 26  21  24    Calcium 8.9 - 10.3 mg/dL 8.6  9.3  9.2   Total Protein 6.5 - 8.1 g/dL 7.8  7.2  7.1   Total Bilirubin 0.3 - 1.2 mg/dL 0.3  0.3  0.2   Alkaline Phos 38 - 126 U/L 69  80  82   AST 15 - 41 U/L 17  16  17    ALT 0 - 44 U/L 13  12  14      Latest Reference Range & Units 04/05/22 14:48 06/30/22 10:21  Iron 28 - 170 ug/dL 28 54  UIBC ug/dL 829 562  TIBC 130 - 865 ug/dL 784 (H) 696  Saturation Ratios 10.4 - 31.8 % 5 (L) 14  Ferritin 11 - 307 ng/mL 3 (L) 26  (H): Data is abnormally high (L): Data is abnormally low   ASSESSMENT & PLAN:  A 32 y.o. female with iron deficiency anemia.  I am pleased with the  normalization of her hemoglobin and platelets since she received her IV iron.  Clinically, she is doing very well.  She knows it remains important for her to undergo a Pap smear/pelvic exam to ensure there is no abnormal gynecologic disease behind her iron deficiency anemia.  Otherwise, I will see her back in 6 months for repeat clinical assessment.  The patient understands all the plans discussed today and is in agreement with them.  Doyal Saric Kirby Funk, MD

## 2022-06-30 ENCOUNTER — Inpatient Hospital Stay: Payer: BC Managed Care – PPO | Attending: Oncology

## 2022-06-30 ENCOUNTER — Inpatient Hospital Stay: Payer: BC Managed Care – PPO | Admitting: Oncology

## 2022-06-30 ENCOUNTER — Other Ambulatory Visit: Payer: Self-pay | Admitting: Oncology

## 2022-06-30 VITALS — BP 133/69 | HR 64 | Temp 98.7°F | Resp 16 | Ht 68.0 in | Wt 219.3 lb

## 2022-06-30 DIAGNOSIS — D500 Iron deficiency anemia secondary to blood loss (chronic): Secondary | ICD-10-CM

## 2022-06-30 DIAGNOSIS — D509 Iron deficiency anemia, unspecified: Secondary | ICD-10-CM | POA: Diagnosis not present

## 2022-06-30 DIAGNOSIS — D75839 Thrombocytosis, unspecified: Secondary | ICD-10-CM | POA: Diagnosis not present

## 2022-06-30 LAB — CBC AND DIFFERENTIAL
HCT: 39 (ref 36–46)
Hemoglobin: 13.3 (ref 12.0–16.0)
Neutrophils Absolute: 7.13
Platelets: 402 10*3/uL — AB (ref 150–400)
WBC: 10.8

## 2022-06-30 LAB — IRON AND TIBC
Iron: 54 ug/dL (ref 28–170)
Saturation Ratios: 14 % (ref 10.4–31.8)
TIBC: 384 ug/dL (ref 250–450)
UIBC: 330 ug/dL

## 2022-06-30 LAB — CBC: RBC: 4.65 (ref 3.87–5.11)

## 2022-06-30 LAB — FERRITIN: Ferritin: 26 ng/mL (ref 11–307)

## 2022-07-05 ENCOUNTER — Telehealth: Payer: Self-pay | Admitting: Oncology

## 2022-07-05 NOTE — Telephone Encounter (Signed)
Contacted pt to schedule an appt. Unable to reach via phone, voicemail was left.    Scheduling Message Entered by Rennis Harding A on 06/30/2022 at  5:33 PM Priority: Routine <No visit type provided>  Department: [facility]  Provider:  Scheduling Notes:  Labs/appt on 12-30-22

## 2022-07-07 ENCOUNTER — Other Ambulatory Visit: Payer: BC Managed Care – PPO

## 2022-07-07 ENCOUNTER — Ambulatory Visit: Payer: BC Managed Care – PPO | Admitting: Oncology

## 2022-07-11 ENCOUNTER — Ambulatory Visit: Payer: BC Managed Care – PPO | Admitting: Physician Assistant

## 2022-07-11 ENCOUNTER — Encounter: Payer: Self-pay | Admitting: Physician Assistant

## 2022-07-11 VITALS — BP 128/74 | HR 72 | Temp 97.0°F | Ht 68.0 in | Wt 220.2 lb

## 2022-07-11 DIAGNOSIS — F411 Generalized anxiety disorder: Secondary | ICD-10-CM

## 2022-07-11 DIAGNOSIS — E782 Mixed hyperlipidemia: Secondary | ICD-10-CM | POA: Diagnosis not present

## 2022-07-11 DIAGNOSIS — F909 Attention-deficit hyperactivity disorder, unspecified type: Secondary | ICD-10-CM | POA: Diagnosis not present

## 2022-07-11 DIAGNOSIS — D509 Iron deficiency anemia, unspecified: Secondary | ICD-10-CM

## 2022-07-11 DIAGNOSIS — E559 Vitamin D deficiency, unspecified: Secondary | ICD-10-CM | POA: Diagnosis not present

## 2022-07-11 DIAGNOSIS — E1165 Type 2 diabetes mellitus with hyperglycemia: Secondary | ICD-10-CM | POA: Diagnosis not present

## 2022-07-11 MED ORDER — AMPHETAMINE-DEXTROAMPHETAMINE 20 MG PO TABS
20.0000 mg | ORAL_TABLET | Freq: Every day | ORAL | 0 refills | Status: DC
Start: 2022-07-11 — End: 2022-08-07

## 2022-07-11 NOTE — Progress Notes (Signed)
Subjective:  Patient ID: Angela Powers, female    DOB: 05-Sep-1990  Age: 32 y.o. MRN: [id number]  Chief Complaint  Patient presents with   Medical Management of Chronic Issues   PT NEW TO ME _ [facility] HPI Mixed hyperlipidemia  Pt presents with hyperlipidemia. Patient was diagnosed in 2022  Compliance with treatment has been good -The patient is compliant with medications, maintains a low cholesterol diet , follows up as directed , and maintains an exercise regimen . The patient denies experiencing any hypercholesterolemia related symptoms.  Is currently taking crestor 10mg  every day  Pt with anxiety with depression.  States her symptoms are well controlled with citalopram and is currently on 30mg  daily dosing (she prefers 2 separate doses)  Pt with NIDDM - was diagnosed about 5-6 years ago.  Currently taking metformin bid and ozempic 1mg  weekly.  She is not taking glucose at home.  Is up to date with eye exam Due for labwork  Pt with ADD.  She tried vyvanse in the past and has also tried Marine scientist.  Neither of these medications have seemed to work well for her .  Would like to try another type of medication  Pt with vit d def - is taking weekly supplement -- due for labwork  Pt has been diagnosed with iron def anemia.  She is currently following with Dr Melvyn Neth.  Last iron studies done in June and she will follow up with him every 6 months.  She did have iron infusions done last month as well.  She does states the first few days of her period are heavy but no prolonged bleeding.  No history of melena or hematochezia but will give her hemoccult cards to rule that out     07/11/2022   10:02 AM 04/05/2022    3:53 PM 03/31/2022    8:13 AM 12/28/2021   11:11 AM 09/27/2021    3:23 PM  Depression screen PHQ 2/9  Decreased Interest 0 0 0 0 0  Down, Depressed, Hopeless 0 0 0 0 0  PHQ - 2 Score 0 0 0 0 0  Altered sleeping 1  0 0 0  Tired, decreased energy 0  0 0 3  Change in  appetite 0  0 0 0  Feeling bad or failure about yourself  0  0 0 0  Trouble concentrating 2  0 0 1  Moving slowly or fidgety/restless 2  0 0 0  Suicidal thoughts 0  0 0 0  PHQ-9 Score 5  0 0 4  Difficult doing work/chores Not difficult at all  Not difficult at all Not difficult at all Not difficult at all        05/05/2020    3:28 PM 11/13/2020    7:57 AM 09/27/2021    3:23 PM 12/28/2021   11:11 AM 07/11/2022   10:02 AM  Fall Risk  Falls in the past year? 0 0 0 0 0  Was there an injury with Fall? 0 0 0 0 0  Fall Risk Category Calculator 0 0 0 0 0  Fall Risk Category (Retired) Low Low Low Low   (RETIRED) Patient Fall Risk Level Low fall risk Low fall risk Low fall risk Low fall risk   Patient at Risk for Falls Due to No Fall Risks No Fall Risks No Fall Risks No Fall Risks No Fall Risks  Fall risk Follow up Falls evaluation completed Falls evaluation completed Falls evaluation completed Falls evaluation completed Falls  evaluation completed     ROS CONSTITUTIONAL: Negative for chills, fatigue, fever, unintentional weight gain and unintentional weight loss.  E/N/T: Negative for ear pain, nasal congestion and sore throat.  CARDIOVASCULAR: Negative for chest pain, dizziness, palpitations and pedal edema.  RESPIRATORY: Negative for recent cough and dyspnea.  GASTROINTESTINAL: Negative for abdominal pain, acid reflux symptoms, constipation, diarrhea, nausea and vomiting.  MSK: Negative for arthralgias and myalgias.  INTEGUMENTARY: Negative for rash.  NEUROLOGICAL: Negative for dizziness and headaches.  PSYCHIATRIC: Negative for sleep disturbance and to question depression screen.  Negative for depression, negative for anhedonia.    Current Outpatient Medications:    amphetamine-dextroamphetamine (ADDERALL) 20 MG tablet, Take 1 tablet (20 mg total) by mouth daily., Disp: 30 tablet, Rfl: 0   citalopram (CELEXA) 10 MG tablet, Take 1 tablet (10 mg total) by mouth daily. Take with Citalopram 20  mg to equal 30 mg., Disp: 90 tablet, Rfl: 0   citalopram (CELEXA) 20 MG tablet, Take 1 tablet (20 mg total) by mouth daily., Disp: 90 tablet, Rfl: 1   Iron, Ferrous Sulfate, 325 (65 Fe) MG TABS, Take 325 mg by mouth daily., Disp: 90 tablet, Rfl: 1   metFORMIN (GLUCOPHAGE-XR) 500 MG 24 hr tablet, TAKE 2 TABLETS BY MOUTH EVERY DAY WITH BREAKFAST, Disp: 180 tablet, Rfl: 2   ondansetron (ZOFRAN-ODT) 4 MG disintegrating tablet, TAKE 1 TABLET BY MOUTH EVERY 8 HOURS AS NEEDED FOR NAUSEA AND VOMITING, Disp: 30 tablet, Rfl: 0   rosuvastatin (CRESTOR) 10 MG tablet, Take 1 tablet (10 mg total) by mouth daily., Disp: 90 tablet, Rfl: 0   Semaglutide, 1 MG/DOSE, (OZEMPIC, 1 MG/DOSE,) 4 MG/3ML SOPN, INJECT 1 MG ONCE A WEEK AS DIRECTED, Disp: 3 mL, Rfl: 1   Vitamin D, Ergocalciferol, (DRISDOL) 1.25 MG (50000 UNIT) CAPS capsule, Take 1 capsule (50,000 Units total) by mouth 2 (two) times a week., Disp: 24 capsule, Rfl: 0  Past Medical History:  Diagnosis Date   Allergy    Anxiety    Depression    Diabetes mellitus without complication (HCC)    Migraine headache    Nephrolithiasis    Objective:  PHYSICAL EXAM:   BP 128/74 (BP Location: Left Arm, Patient Position: Sitting, Cuff Size: Large)   Pulse 72   Temp (!) 97 F (36.1 C) (Temporal)   Ht 5\' 8"  (1.727 m)   Wt 220 lb 3.2 oz (99.9 kg)   SpO2 99%   BMI 33.48 kg/m    GEN: Well nourished, well developed, in no acute distress  Cardiac: RRR; no murmurs, rubs, or gallops,no edema -  Respiratory:  normal respiratory rate and pattern with no distress - normal breath sounds with no rales, rhonchi, wheezes or rubs MS: no deformity or atrophy  Skin: warm and dry, no rash  Psych: euthymic mood, appropriate affect and demeanor  Assessment & Plan:    Type 2 diabetes mellitus with hyperglycemia, without long-term current use of insulin (HCC) -     CBC with Differential/Platelet -     Comprehensive metabolic panel -     TSH -     Hemoglobin A1c -      Microalbumin / creatinine urine ratio Continue current meds Vitamin D deficiency -     VITAMIN D 25 Hydroxy (Vit-D Deficiency, Fractures) Continue supplement Generalized anxiety disorder Continue citalopram Adult ADHD -     Amphetamine-Dextroamphetamine; Take 1 tablet (20 mg total) by mouth daily.  Dispense: 30 tablet; Refill: 0 Stop metadate Mixed hyperlipidemia -  Comprehensive metabolic panel -     Lipid panel Watch diet Continue crestor Iron deficiency anemia, unspecified iron deficiency anemia type Hemoccult cards given Follow up with Dr Melvyn Neth as scheduled    Follow-up: Return in about 3 months (around 10/11/2022) for chronic fasting follow-up.  An After Visit Summary was printed and given to the patient.  Jettie Pagan [facility] [phone]

## 2022-07-12 ENCOUNTER — Other Ambulatory Visit: Payer: Self-pay | Admitting: Physician Assistant

## 2022-07-12 LAB — CBC WITH DIFFERENTIAL/PLATELET
Basophils Absolute: 0 10*3/uL (ref 0.0–0.2)
Basos: 0 %
EOS (ABSOLUTE): 0.2 10*3/uL (ref 0.0–0.4)
Eos: 2 %
Hematocrit: 41.5 % (ref 34.0–46.6)
Hemoglobin: 13.7 g/dL (ref 11.1–15.9)
Immature Grans (Abs): 0 10*3/uL (ref 0.0–0.1)
Immature Granulocytes: 0 %
Lymphocytes Absolute: 2.9 10*3/uL (ref 0.7–3.1)
Lymphs: 32 %
MCH: 28.7 pg (ref 26.6–33.0)
MCHC: 33 g/dL (ref 31.5–35.7)
MCV: 87 fL (ref 79–97)
Monocytes Absolute: 0.5 10*3/uL (ref 0.1–0.9)
Monocytes: 6 %
Neutrophils Absolute: 5.3 10*3/uL (ref 1.4–7.0)
Neutrophils: 60 %
Platelets: 392 10*3/uL (ref 150–450)
RBC: 4.78 x10E6/uL (ref 3.77–5.28)
RDW: 15.5 % — ABNORMAL HIGH (ref 11.7–15.4)
WBC: 8.8 10*3/uL (ref 3.4–10.8)

## 2022-07-12 LAB — MICROALBUMIN / CREATININE URINE RATIO
Creatinine, Urine: 190.1 mg/dL
Microalb/Creat Ratio: 8 mg/g creat (ref 0–29)
Microalbumin, Urine: 14.7 ug/mL

## 2022-07-12 LAB — COMPREHENSIVE METABOLIC PANEL
ALT: 13 IU/L (ref 0–32)
AST: 18 IU/L (ref 0–40)
Albumin: 4.3 g/dL (ref 3.9–4.9)
Alkaline Phosphatase: 86 IU/L (ref 44–121)
BUN/Creatinine Ratio: 15 (ref 9–23)
BUN: 10 mg/dL (ref 6–20)
Bilirubin Total: 0.2 mg/dL (ref 0.0–1.2)
CO2: 23 mmol/L (ref 20–29)
Calcium: 9.3 mg/dL (ref 8.7–10.2)
Chloride: 103 mmol/L (ref 96–106)
Creatinine, Ser: 0.68 mg/dL (ref 0.57–1.00)
Globulin, Total: 2.7 g/dL (ref 1.5–4.5)
Glucose: 94 mg/dL (ref 70–99)
Potassium: 5.2 mmol/L (ref 3.5–5.2)
Sodium: 140 mmol/L (ref 134–144)
Total Protein: 7 g/dL (ref 6.0–8.5)
eGFR: 119 mL/min/{1.73_m2} (ref >59)

## 2022-07-12 LAB — LIPID PANEL
Chol/HDL Ratio: 3.1 ratio (ref 0.0–4.4)
Cholesterol, Total: 192 mg/dL (ref 100–199)
HDL: 61 mg/dL (ref >39)
LDL Chol Calc (NIH): 112 mg/dL — ABNORMAL HIGH (ref 0–99)
Triglycerides: 105 mg/dL (ref 0–149)
VLDL Cholesterol Cal: 19 mg/dL (ref 5–40)

## 2022-07-12 LAB — VITAMIN D 25 HYDROXY (VIT D DEFICIENCY, FRACTURES): Vit D, 25-Hydroxy: 67.3 ng/mL (ref 30.0–100.0)

## 2022-07-12 LAB — HEMOGLOBIN A1C
Est. average glucose Bld gHb Est-mCnc: 128 mg/dL
Hgb A1c MFr Bld: 6.1 % — ABNORMAL HIGH (ref 4.8–5.6)

## 2022-07-12 LAB — TSH: TSH: 1.29 u[IU]/mL (ref 0.450–4.500)

## 2022-07-18 ENCOUNTER — Ambulatory Visit (INDEPENDENT_AMBULATORY_CARE_PROVIDER_SITE_OTHER): Payer: BC Managed Care – PPO

## 2022-07-18 DIAGNOSIS — D509 Iron deficiency anemia, unspecified: Secondary | ICD-10-CM | POA: Diagnosis not present

## 2022-07-18 LAB — POC HEMOCCULT BLD/STL (HOME/3-CARD/SCREEN)
Card #2 Fecal Occult Blod, POC: NEGATIVE
Card #3 Fecal Occult Blood, POC: NEGATIVE
Fecal Occult Blood, POC: NEGATIVE

## 2022-07-18 NOTE — Progress Notes (Signed)
Hemoccults cards results  was negative.

## 2022-07-19 ENCOUNTER — Other Ambulatory Visit: Payer: Self-pay | Admitting: Physician Assistant

## 2022-07-25 ENCOUNTER — Other Ambulatory Visit: Payer: Self-pay | Admitting: Physician Assistant

## 2022-07-25 DIAGNOSIS — R110 Nausea: Secondary | ICD-10-CM

## 2022-08-07 ENCOUNTER — Other Ambulatory Visit: Payer: Self-pay | Admitting: Physician Assistant

## 2022-08-07 DIAGNOSIS — F909 Attention-deficit hyperactivity disorder, unspecified type: Secondary | ICD-10-CM

## 2022-08-08 MED ORDER — AMPHETAMINE-DEXTROAMPHETAMINE 20 MG PO TABS
20.0000 mg | ORAL_TABLET | Freq: Every day | ORAL | 0 refills | Status: DC
Start: 2022-08-08 — End: 2022-09-12

## 2022-08-08 MED ORDER — VITAMIN D (ERGOCALCIFEROL) 1.25 MG (50000 UNIT) PO CAPS: 50000.0000 [IU] | ORAL_CAPSULE | ORAL | 0 refills | Status: DC

## 2022-08-15 ENCOUNTER — Other Ambulatory Visit: Payer: Self-pay | Admitting: Physician Assistant

## 2022-08-15 DIAGNOSIS — E1165 Type 2 diabetes mellitus with hyperglycemia: Secondary | ICD-10-CM

## 2022-09-08 ENCOUNTER — Other Ambulatory Visit: Payer: Self-pay | Admitting: Family Medicine

## 2022-09-08 DIAGNOSIS — F418 Other specified anxiety disorders: Secondary | ICD-10-CM

## 2022-09-12 ENCOUNTER — Other Ambulatory Visit: Payer: Self-pay | Admitting: Physician Assistant

## 2022-09-12 DIAGNOSIS — R110 Nausea: Secondary | ICD-10-CM

## 2022-09-12 DIAGNOSIS — F909 Attention-deficit hyperactivity disorder, unspecified type: Secondary | ICD-10-CM

## 2022-09-13 MED ORDER — AMPHETAMINE-DEXTROAMPHETAMINE 20 MG PO TABS
20.0000 mg | ORAL_TABLET | Freq: Every day | ORAL | 0 refills | Status: DC
Start: 2022-09-13 — End: 2022-10-17

## 2022-10-02 ENCOUNTER — Other Ambulatory Visit: Payer: Self-pay | Admitting: Physician Assistant

## 2022-10-06 ENCOUNTER — Other Ambulatory Visit: Payer: Self-pay

## 2022-10-06 DIAGNOSIS — D509 Iron deficiency anemia, unspecified: Secondary | ICD-10-CM

## 2022-10-06 MED ORDER — IRON (FERROUS SULFATE) 325 (65 FE) MG PO TABS
325.0000 mg | ORAL_TABLET | Freq: Every day | ORAL | 0 refills | Status: DC
Start: 2022-10-06 — End: 2023-01-05

## 2022-10-17 ENCOUNTER — Other Ambulatory Visit: Payer: Self-pay | Admitting: Physician Assistant

## 2022-10-17 DIAGNOSIS — F909 Attention-deficit hyperactivity disorder, unspecified type: Secondary | ICD-10-CM

## 2022-10-17 MED ORDER — AMPHETAMINE-DEXTROAMPHETAMINE 20 MG PO TABS
20.0000 mg | ORAL_TABLET | Freq: Every day | ORAL | 0 refills | Status: DC
Start: 2022-10-17 — End: 2022-10-21

## 2022-10-21 ENCOUNTER — Ambulatory Visit: Payer: BC Managed Care – PPO | Admitting: Physician Assistant

## 2022-10-21 VITALS — BP 110/74 | HR 62 | Temp 97.2°F | Ht 68.0 in | Wt 219.4 lb

## 2022-10-21 DIAGNOSIS — E1165 Type 2 diabetes mellitus with hyperglycemia: Secondary | ICD-10-CM | POA: Diagnosis not present

## 2022-10-21 DIAGNOSIS — E782 Mixed hyperlipidemia: Secondary | ICD-10-CM

## 2022-10-21 DIAGNOSIS — E559 Vitamin D deficiency, unspecified: Secondary | ICD-10-CM | POA: Diagnosis not present

## 2022-10-21 DIAGNOSIS — F411 Generalized anxiety disorder: Secondary | ICD-10-CM | POA: Diagnosis not present

## 2022-10-21 DIAGNOSIS — D509 Iron deficiency anemia, unspecified: Secondary | ICD-10-CM

## 2022-10-21 DIAGNOSIS — F909 Attention-deficit hyperactivity disorder, unspecified type: Secondary | ICD-10-CM

## 2022-10-21 DIAGNOSIS — J060 Acute laryngopharyngitis: Secondary | ICD-10-CM

## 2022-10-21 MED ORDER — AZITHROMYCIN 250 MG PO TABS
ORAL_TABLET | ORAL | 0 refills | Status: AC
Start: 2022-10-21 — End: 2022-10-26

## 2022-10-21 MED ORDER — AMPHETAMINE-DEXTROAMPHET ER 30 MG PO CP24
30.0000 mg | ORAL_CAPSULE | ORAL | 0 refills | Status: DC
Start: 2022-10-21 — End: 2022-11-18

## 2022-10-22 LAB — HEMOGLOBIN A1C
Est. average glucose Bld gHb Est-mCnc: 114 mg/dL
Hgb A1c MFr Bld: 5.6 % (ref 4.8–5.6)

## 2022-10-22 LAB — COMPREHENSIVE METABOLIC PANEL
ALT: 14 [IU]/L (ref 0–32)
AST: 15 [IU]/L (ref 0–40)
Albumin: 4.3 g/dL (ref 3.9–4.9)
Alkaline Phosphatase: 73 [IU]/L (ref 44–121)
BUN/Creatinine Ratio: 16 (ref 9–23)
BUN: 10 mg/dL (ref 6–20)
Bilirubin Total: 0.3 mg/dL (ref 0.0–1.2)
CO2: 25 mmol/L (ref 20–29)
Calcium: 8.9 mg/dL (ref 8.7–10.2)
Chloride: 101 mmol/L (ref 96–106)
Creatinine, Ser: 0.61 mg/dL (ref 0.57–1.00)
Globulin, Total: 2.5 g/dL (ref 1.5–4.5)
Glucose: 74 mg/dL (ref 70–99)
Potassium: 4.4 mmol/L (ref 3.5–5.2)
Sodium: 138 mmol/L (ref 134–144)
Total Protein: 6.8 g/dL (ref 6.0–8.5)
eGFR: 122 mL/min/{1.73_m2} (ref >59)

## 2022-10-22 LAB — CBC WITH DIFFERENTIAL/PLATELET
Basophils Absolute: 0 10*3/uL (ref 0.0–0.2)
Basos: 0 %
EOS (ABSOLUTE): 0.1 10*3/uL (ref 0.0–0.4)
Eos: 1 %
Hematocrit: 40.5 % (ref 34.0–46.6)
Hemoglobin: 13.1 g/dL (ref 11.1–15.9)
Immature Grans (Abs): 0.1 10*3/uL (ref 0.0–0.1)
Immature Granulocytes: 1 %
Lymphocytes Absolute: 2.6 10*3/uL (ref 0.7–3.1)
Lymphs: 25 %
MCH: 29.3 pg (ref 26.6–33.0)
MCHC: 32.3 g/dL (ref 31.5–35.7)
MCV: 91 fL (ref 79–97)
Monocytes Absolute: 0.5 10*3/uL (ref 0.1–0.9)
Monocytes: 5 %
Neutrophils Absolute: 7.3 10*3/uL — ABNORMAL HIGH (ref 1.4–7.0)
Neutrophils: 68 %
Platelets: 345 10*3/uL (ref 150–450)
RBC: 4.47 x10E6/uL (ref 3.77–5.28)
RDW: 12.2 % (ref 11.7–15.4)
WBC: 10.5 10*3/uL (ref 3.4–10.8)

## 2022-10-22 LAB — LIPID PANEL
Chol/HDL Ratio: 3.9 {ratio} (ref 0.0–4.4)
Cholesterol, Total: 145 mg/dL (ref 100–199)
HDL: 37 mg/dL — ABNORMAL LOW (ref >39)
LDL Chol Calc (NIH): 85 mg/dL (ref 0–99)
Triglycerides: 126 mg/dL (ref 0–149)
VLDL Cholesterol Cal: 23 mg/dL (ref 5–40)

## 2022-10-22 LAB — VITAMIN D 25 HYDROXY (VIT D DEFICIENCY, FRACTURES): Vit D, 25-Hydroxy: 111 ng/mL — ABNORMAL HIGH (ref 30.0–100.0)

## 2022-10-24 ENCOUNTER — Other Ambulatory Visit: Payer: Self-pay | Admitting: Physician Assistant

## 2022-10-24 NOTE — Progress Notes (Signed)
Subjective:  Patient ID: Angela Powers, female    DOB: 08-25-1990  Age: 32 y.o. MRN: [id number]  Chief Complaint  Patient presents with   Medical Management of Chronic Issues    HPI Mixed hyperlipidemia  Pt presents with hyperlipidemia. Patient was diagnosed in 2022  Compliance with treatment has been good -The patient is compliant with medications, maintains a low cholesterol diet , follows up as directed , and maintains an exercise regimen . The patient denies experiencing any hypercholesterolemia related symptoms.  Is currently taking crestor 10mg  every day  Pt with anxiety with depression.  States her symptoms are well controlled with citalopram and is currently on 30mg  daily dosing (she prefers 2 separate doses)  Pt with NIDDM - was diagnosed about 5-6 years ago.  Currently taking ozempic 1mg  weekly.  She is not taking glucose at home.  Is up to date with eye exam Due for labwork  Pt with ADD and was started on adderall at last visit - she states this medication seems to be working well for her but thinks would benefit from slight increase in dose  Pt with vit d def - is taking weekly supplement -- due for labwork  Pt has been diagnosed with iron def anemia.  She is currently following with Dr Melvyn Neth and follows every 6 months -  She did have iron infusions done few months ago - she is taking daily iron supplement  Pt complains of mild uri symptoms with ears popping and sinus pressure/pnd    10/21/2022   10:56 AM 07/11/2022   10:02 AM 04/05/2022    3:53 PM 03/31/2022    8:13 AM 12/28/2021   11:11 AM  Depression screen PHQ 2/9  Decreased Interest 0 0 0 0 0  Down, Depressed, Hopeless 0 0 0 0 0  PHQ - 2 Score 0 0 0 0 0  Altered sleeping 0 1  0 0  Tired, decreased energy 2 0  0 0  Change in appetite 0 0  0 0  Feeling bad or failure about yourself  0 0  0 0  Trouble concentrating 1 2  0 0  Moving slowly or fidgety/restless 0 2  0 0  Suicidal thoughts 0 0  0 0   PHQ-9 Score 3 5  0 0  Difficult doing work/chores Somewhat difficult Not difficult at all  Not difficult at all Not difficult at all        11/13/2020    7:57 AM 09/27/2021    3:23 PM 12/28/2021   11:11 AM 07/11/2022   10:02 AM 10/21/2022   10:56 AM  Fall Risk  Falls in the past year? 0 0 0 0 0  Was there an injury with Fall? 0 0 0 0 0  Fall Risk Category Calculator 0 0 0 0 0  Fall Risk Category (Retired) Low Low Low    (RETIRED) Patient Fall Risk Level Low fall risk Low fall risk Low fall risk    Patient at Risk for Falls Due to No Fall Risks No Fall Risks No Fall Risks No Fall Risks No Fall Risks  Fall risk Follow up Falls evaluation completed Falls evaluation completed Falls evaluation completed Falls evaluation completed Falls evaluation completed    CONSTITUTIONAL: Negative for chills, fatigue, fever, unintentional weight gain and unintentional weight loss.  E/N/T: see HPI CARDIOVASCULAR: Negative for chest pain, dizziness, palpitations and pedal edema.  RESPIRATORY: Negative for recent cough and dyspnea.  GASTROINTESTINAL: Negative for abdominal pain, acid  reflux symptoms, constipation, diarrhea, nausea and vomiting.  MSK: Negative for arthralgias and myalgias.  INTEGUMENTARY: Negative for rash.  NEUROLOGICAL: Negative for dizziness and headaches.  PSYCHIATRIC: Negative for sleep disturbance and to question depression screen.  Negative for depression, negative for anhedonia.       Current Outpatient Medications:    amphetamine-dextroamphetamine (ADDERALL XR) 30 MG 24 hr capsule, Take 1 capsule (30 mg total) by mouth every morning., Disp: 30 capsule, Rfl: 0   azithromycin (ZITHROMAX) 250 MG tablet, Take 2 tablets on day 1, then 1 tablet daily on days 2 through 5, Disp: 6 tablet, Rfl: 0   citalopram (CELEXA) 10 MG tablet, TAKE 1 TABLET (10 MG TOTAL) BY MOUTH DAILY. TAKE WITH CITALOPRAM 20 MG TO EQUAL 30 MG., Disp: 90 tablet, Rfl: 0   citalopram (CELEXA) 20 MG tablet, Take 1  tablet (20 mg total) by mouth daily., Disp: 90 tablet, Rfl: 1   Iron, Ferrous Sulfate, 325 (65 Fe) MG TABS, Take 325 mg by mouth daily., Disp: 90 tablet, Rfl: 0   ondansetron (ZOFRAN-ODT) 4 MG disintegrating tablet, TAKE 1 TABLET BY MOUTH EVERY 8 HOURS AS NEEDED FOR NAUSEA AND VOMITING, Disp: 30 tablet, Rfl: 0   rosuvastatin (CRESTOR) 10 MG tablet, TAKE 1 TABLET BY MOUTH EVERY DAY, Disp: 90 tablet, Rfl: 0   Semaglutide, 1 MG/DOSE, (OZEMPIC, 1 MG/DOSE,) 4 MG/3ML SOPN, INJECT 1 MG ONCE A WEEK AS DIRECTED, Disp: 3 mL, Rfl: 1  Past Medical History:  Diagnosis Date   Allergy    Anxiety    Depression    Diabetes mellitus without complication (HCC)    Migraine headache    Nephrolithiasis    Objective:  PHYSICAL EXAM:   VS: BP 110/74 (BP Location: Left Arm, Patient Position: Sitting, Cuff Size: Large)   Pulse 62   Temp (!) 97.2 F (36.2 C) (Temporal)   Ht 5\' 8"  (1.727 m)   Wt 219 lb 6.4 oz (99.5 kg)   LMP 10/03/2022 (Approximate)   SpO2 93%   BMI 33.36 kg/m   GEN: Well nourished, well developed, in no acute distress  HEENT: normal external ears and nose - normal external auditory canals and TMS -  - Lips, Teeth and Gums - normal  Oropharynx - normal mucosa, palate, and posterior pharynx Neck: no JVD or masses - no thyromegaly Cardiac: RRR; no murmurs, rubs, or gallops,no edema -  Respiratory:  normal respiratory rate and pattern with no distress - normal breath sounds with no rales, rhonchi, wheezes or rubs MS: no deformity or atrophy  Skin: warm and dry, no rash  Neuro:  Alert and Oriented x 3,  CN II-Xii grossly intact Psych: euthymic mood, appropriate affect and demeanor   Assessment & Plan:    Type 2 diabetes mellitus with hyperglycemia, without long-term current use of insulin (HCC) -     CBC with Differential/Platelet -     Comprehensive metabolic panel -     TSH -     Hemoglobin A1c -  Continue current meds Vitamin D deficiency -     VITAMIN D 25 Hydroxy (Vit-D  Deficiency, Fractures) Continue supplement Generalized anxiety disorder Continue citalopram Adult ADHD -     Amphetamine-Dextroamphetamine; Take 1 tablet (30 mg total) by mouth daily.  Dispense: 30 tablet; Refill: 0  Mixed hyperlipidemia -     Comprehensive metabolic panel -     Lipid panel Watch diet Continue crestor Iron deficiency anemia, unspecified iron deficiency anemia type   URI Rx for zpack  to take as directed  Follow up with Dr Melvyn Neth as scheduled    Follow-up: Return in about 4 months (around 02/21/2023) for fasting physical with pap - 40 min.  An After Visit Summary was printed and given to the patient.  Jettie Pagan [facility] [phone]

## 2022-10-26 ENCOUNTER — Other Ambulatory Visit: Payer: Self-pay | Admitting: Family Medicine

## 2022-10-26 ENCOUNTER — Other Ambulatory Visit: Payer: Self-pay | Admitting: Physician Assistant

## 2022-10-26 DIAGNOSIS — F418 Other specified anxiety disorders: Secondary | ICD-10-CM

## 2022-11-02 ENCOUNTER — Other Ambulatory Visit: Payer: Self-pay | Admitting: Physician Assistant

## 2022-11-02 DIAGNOSIS — E1165 Type 2 diabetes mellitus with hyperglycemia: Secondary | ICD-10-CM

## 2022-11-13 ENCOUNTER — Other Ambulatory Visit: Payer: Self-pay | Admitting: Physician Assistant

## 2022-11-13 DIAGNOSIS — R110 Nausea: Secondary | ICD-10-CM

## 2022-11-14 MED ORDER — ONDANSETRON 4 MG PO TBDP
ORAL_TABLET | ORAL | 0 refills | Status: DC
Start: 2022-11-14 — End: 2023-01-09

## 2022-11-18 ENCOUNTER — Other Ambulatory Visit: Payer: Self-pay | Admitting: Physician Assistant

## 2022-11-18 DIAGNOSIS — F909 Attention-deficit hyperactivity disorder, unspecified type: Secondary | ICD-10-CM

## 2022-11-21 MED ORDER — AMPHETAMINE-DEXTROAMPHET ER 30 MG PO CP24
30.0000 mg | ORAL_CAPSULE | ORAL | 0 refills | Status: DC
Start: 2022-11-21 — End: 2022-12-18

## 2022-12-16 ENCOUNTER — Other Ambulatory Visit: Payer: Self-pay | Admitting: Family Medicine

## 2022-12-16 DIAGNOSIS — F418 Other specified anxiety disorders: Secondary | ICD-10-CM

## 2022-12-17 ENCOUNTER — Encounter: Payer: Self-pay | Admitting: Oncology

## 2022-12-18 ENCOUNTER — Other Ambulatory Visit: Payer: Self-pay | Admitting: Physician Assistant

## 2022-12-18 DIAGNOSIS — F909 Attention-deficit hyperactivity disorder, unspecified type: Secondary | ICD-10-CM

## 2022-12-19 MED ORDER — AMPHETAMINE-DEXTROAMPHET ER 30 MG PO CP24: 30.0000 mg | ORAL_CAPSULE | ORAL | 0 refills | Status: DC

## 2022-12-30 ENCOUNTER — Inpatient Hospital Stay: Payer: BC Managed Care – PPO | Admitting: Oncology

## 2022-12-30 ENCOUNTER — Other Ambulatory Visit: Payer: Self-pay | Admitting: Physician Assistant

## 2022-12-30 ENCOUNTER — Inpatient Hospital Stay: Payer: BC Managed Care – PPO

## 2022-12-30 DIAGNOSIS — E1165 Type 2 diabetes mellitus with hyperglycemia: Secondary | ICD-10-CM

## 2023-01-03 ENCOUNTER — Other Ambulatory Visit: Payer: Self-pay | Admitting: Physician Assistant

## 2023-01-03 DIAGNOSIS — D509 Iron deficiency anemia, unspecified: Secondary | ICD-10-CM

## 2023-01-08 ENCOUNTER — Other Ambulatory Visit: Payer: Self-pay | Admitting: Physician Assistant

## 2023-01-08 DIAGNOSIS — R110 Nausea: Secondary | ICD-10-CM

## 2023-01-26 ENCOUNTER — Other Ambulatory Visit: Payer: Self-pay | Admitting: Physician Assistant

## 2023-01-26 DIAGNOSIS — F909 Attention-deficit hyperactivity disorder, unspecified type: Secondary | ICD-10-CM

## 2023-01-26 MED ORDER — AMPHETAMINE-DEXTROAMPHET ER 30 MG PO CP24: 30.0000 mg | ORAL_CAPSULE | ORAL | 0 refills | Status: DC

## 2023-02-23 ENCOUNTER — Encounter: Payer: Self-pay | Admitting: Physician Assistant

## 2023-02-23 ENCOUNTER — Ambulatory Visit: Payer: BC Managed Care – PPO | Admitting: Physician Assistant

## 2023-02-23 VITALS — BP 110/80 | HR 84 | Temp 97.3°F | Resp 14 | Ht 68.0 in | Wt 210.0 lb

## 2023-02-23 DIAGNOSIS — Z0000 Encounter for general adult medical examination without abnormal findings: Secondary | ICD-10-CM

## 2023-02-23 DIAGNOSIS — E1165 Type 2 diabetes mellitus with hyperglycemia: Secondary | ICD-10-CM | POA: Diagnosis not present

## 2023-02-23 DIAGNOSIS — D509 Iron deficiency anemia, unspecified: Secondary | ICD-10-CM

## 2023-02-23 DIAGNOSIS — E559 Vitamin D deficiency, unspecified: Secondary | ICD-10-CM

## 2023-02-23 DIAGNOSIS — N3000 Acute cystitis without hematuria: Secondary | ICD-10-CM | POA: Diagnosis not present

## 2023-02-23 DIAGNOSIS — F418 Other specified anxiety disorders: Secondary | ICD-10-CM

## 2023-02-23 LAB — POCT URINALYSIS DIP (CLINITEK)
Blood, UA: NEGATIVE
Glucose, UA: NEGATIVE mg/dL
Nitrite, UA: NEGATIVE
POC PROTEIN,UA: 30 — AB
Spec Grav, UA: >=1.03 — AB (ref 1.010–1.025)
Urobilinogen, UA: 1 U/dL
pH, UA: 6 (ref 5.0–8.0)

## 2023-02-23 MED ORDER — CITALOPRAM HYDROBROMIDE 20 MG PO TABS: 20.0000 mg | ORAL_TABLET | Freq: Every day | ORAL | 1 refills | Status: DC

## 2023-02-23 MED ORDER — DOXYCYCLINE HYCLATE 100 MG PO TABS: 100.0000 mg | ORAL_TABLET | Freq: Two times a day (BID) | ORAL | 0 refills | Status: DC

## 2023-02-23 MED ORDER — CITALOPRAM HYDROBROMIDE 10 MG PO TABS: 10.0000 mg | ORAL_TABLET | Freq: Every day | ORAL | 1 refills | Status: DC

## 2023-02-23 NOTE — Progress Notes (Signed)
Subjective:  Patient ID: Angela Powers, female    DOB: 09-05-1990  Age: 33 y.o. MRN: [id number]  Chief Complaint  Patient presents with   Annual Exam    HPI Well Adult Physical: Patient here for a comprehensive physical exam.The patient reports no problems Do you take any herbs or supplements that were not prescribed by a doctor? no Are you taking calcium supplements? no Are you taking aspirin daily? no  Encounter for general adult medical examination without abnormal findings  Physical ("At Risk" items are starred): Patient's last physical exam was 1 year ago .  Patient is not afflicted from Stress Incontinence and Urge Incontinence  Patient wears a seat belts Patient has smoke detectors and has carbon monoxide detectors. Patient wears sunscreen with extended sun exposure. Dental Care: brushes and flosses daily. Last dental visit: up to date Vision impairments: wears contacts Ophthalmology/Optometry: Annual visit.  Hearing loss: none  Patient's last menstrual period was 02/05/2023 (approximate). Pregnancy history: G2P2 Safe at home: Yes Self breast exams: Yes Last pap: due       02/23/2023    9:04 AM 10/21/2022   10:56 AM 07/11/2022   10:02 AM 04/05/2022    3:53 PM 03/31/2022    8:13 AM  Depression screen PHQ 2/9  Decreased Interest 0 0 0 0 0  Down, Depressed, Hopeless 0 0 0 0 0  PHQ - 2 Score 0 0 0 0 0  Altered sleeping 0 0 1  0  Tired, decreased energy 0 2 0  0  Change in appetite 0 0 0  0  Feeling bad or failure about yourself  0 0 0  0  Trouble concentrating 0 1 2  0  Moving slowly or fidgety/restless 0 0 2  0  Suicidal thoughts 0 0 0  0  PHQ-9 Score 0 3 5  0  Difficult doing work/chores Not difficult at all Somewhat difficult Not difficult at all  Not difficult at all         09/27/2021    3:23 PM 12/28/2021   11:11 AM 07/11/2022   10:02 AM 10/21/2022   10:56 AM 02/23/2023    9:04 AM  Fall Risk  Falls in the past year? 0 0 0 0 0  Was there an  injury with Fall? 0 0 0 0 0  Fall Risk Category Calculator 0 0 0 0 0  Fall Risk Category (Retired) Low Low     (RETIRED) Patient Fall Risk Level Low fall risk Low fall risk     Patient at Risk for Falls Due to No Fall Risks No Fall Risks No Fall Risks No Fall Risks No Fall Risks  Fall risk Follow up Falls evaluation completed Falls evaluation completed Falls evaluation completed Falls evaluation completed Falls evaluation completed             Social Hx   Social History   Socioeconomic History   Marital status: Married    Spouse name: ALEX   Number of children: 2   Years of education: 12 + 4   Highest education level: Bachelor's degree (e.g., BA, AB, BS)  Occupational History   Occupation: 1rst grade teacher  Tobacco Use   Smoking status: Never   Smokeless tobacco: Never  Vaping Use   Vaping status: Never Used  Substance and Sexual Activity   Alcohol use: No   Drug use: No   Sexual activity: Yes    Birth control/protection: Condom, None  Other Topics Concern  Not on file  Social History Narrative   Not on file   Social Drivers of Health   Financial Resource Strain: Low Risk  (02/22/2023)   Overall Financial Resource Strain (CARDIA)    Difficulty of Paying Living Expenses: Not hard at all  Food Insecurity: No Food Insecurity (02/22/2023)   Hunger Vital Sign    Worried About Running Out of Food in the Last Year: Never true    Ran Out of Food in the Last Year: Never true  Transportation Needs: No Transportation Needs (02/22/2023)   PRAPARE - Administrator, Civil Service (Medical): No    Lack of Transportation (Non-Medical): No  Physical Activity: Insufficiently Active (02/22/2023)   Exercise Vital Sign    Days of Exercise per Week: 2 days    Minutes of Exercise per Session: 10 min  Stress: No Stress Concern Present (02/22/2023)   [employer] of Occupational Health - Occupational Stress Questionnaire    Feeling of Stress : Only a little  Social  Connections: Socially Isolated (02/22/2023)   Social Connection and Isolation Panel [NHANES]    Frequency of Communication with Friends and Family: Once a week    Frequency of Social Gatherings with Friends and Family: Once a week    Attends Religious Services: Never    Database administrator or Organizations: No    Attends Engineer, structural: Never    Marital Status: Married   Past Medical History:  Diagnosis Date   Allergy    Anemia    Anxiety    Depression    Diabetes mellitus without complication (HCC)    Migraine headache    Nephrolithiasis    Past Surgical History:  Procedure Laterality Date   CESAREAN SECTION     CESAREAN SECTION MULTI-GESTATIONAL N/A 02/26/2018   Procedure: CESAREAN SECTION MULTI-GESTATIONAL;  Surgeon: Mitchel Honour, DO;  Location: [facility];  Service: Obstetrics;  Laterality: N/A;    Family History  Problem Relation Age of Onset   Mental illness Mother    Anxiety disorder Mother    Depression Mother    Hypertension Maternal Grandmother    Breast cancer Maternal Grandmother    Anxiety disorder Sister    Depression Sister     ROS CONSTITUTIONAL: Negative for chills, fatigue, fever, unintentional weight gain and unintentional weight loss.  E/N/T: Negative for ear pain, nasal congestion and sore throat.  CARDIOVASCULAR: Negative for chest pain, dizziness, palpitations and pedal edema.  RESPIRATORY: Negative for recent cough and dyspnea.  GASTROINTESTINAL: Negative for abdominal pain, acid reflux symptoms, constipation, diarrhea, nausea and vomiting.  MSK: Negative for arthralgias and myalgias.  INTEGUMENTARY: Negative for rash.  NEUROLOGICAL: Negative for dizziness and headaches.  PSYCHIATRIC: Negative for sleep disturbance and to question depression screen.  Negative for depression, negative for anhedonia.   Objective:  PHYSICAL EXAM:   BP 110/80   Pulse 84   Temp (!) 97.3 F (36.3 C)   Resp 14   Ht 5\' 8"  (1.727 m)   Wt  210 lb (95.3 kg)   LMP 02/05/2023 (Approximate)   SpO2 99%   BMI 31.93 kg/m   Vision Screening   Right eye Left eye Both eyes  Without correction     With correction 20/20 20/25 20/20      GEN: Well nourished, well developed, in no acute distress  HEENT: normal external ears and nose - normal external auditory canals and TMS - - Lips, Teeth and Gums - normal  Oropharynx - normal  mucosa, palate, and posterior pharynx Cardiac: RRR; no murmurs, rubs, or gallops,no edema -  Respiratory:  normal respiratory rate and pattern with no distress - normal breath sounds with no rales, rhonchi, wheezes or rubs GI: normal bowel sounds, no masses or tenderness MS: no deformity or atrophy  Skin: warm and dry, no rash  Neuro:  Alert and Oriented x 3, Strength and sensation are intact - CN II-Xii grossly intact Psych: euthymic mood, appropriate affect and demeanor Diabetic Foot Exam - Simple   Simple Foot Form Diabetic Foot exam was performed with the following findings: Yes 02/23/2023  9:05 AM  Visual Inspection No deformities, no ulcerations, no other skin breakdown bilaterally: Yes Sensation Testing Intact to touch and monofilament testing bilaterally: Yes Pulse Check Posterior Tibialis and Dorsalis pulse intact bilaterally: Yes Comments    Office Visit on 02/23/2023  Component Date Value Ref Range Status   Color, UA 02/23/2023 yellow  yellow Final   Clarity, UA 02/23/2023 clear  clear Final   Glucose, UA 02/23/2023 negative  negative mg/dL Final   Bilirubin, UA 13/24/4010 small (A)  negative Final   Ketones, POC UA 02/23/2023 trace (5) (A)  negative mg/dL Final   Spec Grav, UA 27/25/3664 >=1.030 (A)  1.010 - 1.025 Final   Blood, UA 02/23/2023 negative  negative Final   pH, UA 02/23/2023 6.0  5.0 - 8.0 Final   POC PROTEIN,UA 02/23/2023 =30 (A)  negative, trace Final   Urobilinogen, UA 02/23/2023 1.0  0.2 or 1.0 E.U./dL Final   Nitrite, UA 40/34/7425 Negative  Negative Final    Leukocytes, UA 02/23/2023 Small (1+) (A)  Negative Final     Assessment & Plan:  Annual physical exam -     POCT URINALYSIS DIP (CLINITEK) -     IGP, Aptima HPV, rfx 16/18,45 -     CBC with Differential/Platelet -     Comprehensive metabolic panel -     TSH -     Lipid panel -     Hemoglobin A1c -     VITAMIN D 25 Hydroxy (Vit-D Deficiency, Fractures)  Acute cystitis without hematuria -     POCT URINALYSIS DIP (CLINITEK) -     Urine Culture -     Doxycycline Hyclate; Take 1 tablet (100 mg total) by mouth 2 (two) times daily.  Dispense: 14 tablet; Refill: 0  Type 2 diabetes mellitus with hyperglycemia, without long-term current use of insulin (HCC) -     Hemoglobin A1c  Vitamin D deficiency -     VITAMIN D 25 Hydroxy (Vit-D Deficiency, Fractures)  Iron deficiency anemia, unspecified iron deficiency anemia type -     Iron, TIBC and Ferritin Panel  Anxiety with depression -     Citalopram Hydrobromide; Take 1 tablet (20 mg total) by mouth daily.  Dispense: 90 tablet; Refill: 1 -     Citalopram Hydrobromide; Take 1 tablet (10 mg total) by mouth daily. Take with Citalopram 20 mg to equal 30 mg.  Dispense: 90 tablet; Refill: 1    This is a list of the screening recommended for you and due dates:  Health Maintenance  Topic Date Due   Pap with HPV screening  Never done   Flu Shot  04/10/2023*   Hemoglobin A1C  04/21/2023   Eye exam for diabetics  05/19/2023   Yearly kidney health urinalysis for diabetes  07/11/2023   Yearly kidney function blood test for diabetes  10/21/2023   Complete foot exam   02/23/2024  DTaP/Tdap/Td vaccine (2 - Td or Tdap) 11/25/2027   Pneumococcal Vaccination  Completed   HPV Vaccine  Aged Out   COVID-19 Vaccine  Discontinued   Hepatitis C Screening  Discontinued   HIV Screening  Discontinued  *Topic was postponed. The date shown is not the original due date.     Follow-up: Return in about 4 months (around 06/23/2023) for chronic fasting  follow-up.  An After Visit Summary was printed and given to the patient.  Jettie Pagan [facility] [phone]

## 2023-02-24 LAB — COMPREHENSIVE METABOLIC PANEL
ALT: 11 [IU]/L (ref 0–32)
AST: 15 [IU]/L (ref 0–40)
Albumin: 4.7 g/dL (ref 3.9–4.9)
Alkaline Phosphatase: 79 [IU]/L (ref 44–121)
BUN/Creatinine Ratio: 22 (ref 9–23)
BUN: 14 mg/dL (ref 6–20)
Bilirubin Total: 0.3 mg/dL (ref 0.0–1.2)
CO2: 24 mmol/L (ref 20–29)
Calcium: 9.7 mg/dL (ref 8.7–10.2)
Chloride: 100 mmol/L (ref 96–106)
Creatinine, Ser: 0.64 mg/dL (ref 0.57–1.00)
Globulin, Total: 2.5 g/dL (ref 1.5–4.5)
Glucose: 102 mg/dL — ABNORMAL HIGH (ref 70–99)
Potassium: 4.5 mmol/L (ref 3.5–5.2)
Sodium: 140 mmol/L (ref 134–144)
Total Protein: 7.2 g/dL (ref 6.0–8.5)
eGFR: 120 mL/min/{1.73_m2} (ref >59)

## 2023-02-24 LAB — CBC WITH DIFFERENTIAL/PLATELET
Basophils Absolute: 0 10*3/uL (ref 0.0–0.2)
Basos: 0 %
EOS (ABSOLUTE): 0.1 10*3/uL (ref 0.0–0.4)
Eos: 1 %
Hematocrit: 43.5 % (ref 34.0–46.6)
Hemoglobin: 13.8 g/dL (ref 11.1–15.9)
Immature Grans (Abs): 0 10*3/uL (ref 0.0–0.1)
Immature Granulocytes: 0 %
Lymphocytes Absolute: 2.6 10*3/uL (ref 0.7–3.1)
Lymphs: 22 %
MCH: 28.3 pg (ref 26.6–33.0)
MCHC: 31.7 g/dL (ref 31.5–35.7)
MCV: 89 fL (ref 79–97)
Monocytes Absolute: 0.6 10*3/uL (ref 0.1–0.9)
Monocytes: 5 %
Neutrophils Absolute: 8.6 10*3/uL — ABNORMAL HIGH (ref 1.4–7.0)
Neutrophils: 72 %
Platelets: 415 10*3/uL (ref 150–450)
RBC: 4.87 x10E6/uL (ref 3.77–5.28)
RDW: 13.1 % (ref 11.7–15.4)
WBC: 12 10*3/uL — ABNORMAL HIGH (ref 3.4–10.8)

## 2023-02-24 LAB — LIPID PANEL
Chol/HDL Ratio: 3.2 {ratio} (ref 0.0–4.4)
Cholesterol, Total: 168 mg/dL (ref 100–199)
HDL: 52 mg/dL (ref >39)
LDL Chol Calc (NIH): 93 mg/dL (ref 0–99)
Triglycerides: 131 mg/dL (ref 0–149)
VLDL Cholesterol Cal: 23 mg/dL (ref 5–40)

## 2023-02-24 LAB — IRON,TIBC AND FERRITIN PANEL
Ferritin: 76 ng/mL (ref 15–150)
Iron Saturation: 26 % (ref 15–55)
Iron: 94 ug/dL (ref 27–159)
Total Iron Binding Capacity: 362 ug/dL (ref 250–450)
UIBC: 268 ug/dL (ref 131–425)

## 2023-02-24 LAB — TSH: TSH: 1.42 u[IU]/mL (ref 0.450–4.500)

## 2023-02-24 LAB — HEMOGLOBIN A1C
Est. average glucose Bld gHb Est-mCnc: 120 mg/dL
Hgb A1c MFr Bld: 5.8 % — ABNORMAL HIGH (ref 4.8–5.6)

## 2023-02-24 LAB — VITAMIN D 25 HYDROXY (VIT D DEFICIENCY, FRACTURES): Vit D, 25-Hydroxy: 31.2 ng/mL (ref 30.0–100.0)

## 2023-02-27 ENCOUNTER — Other Ambulatory Visit: Payer: Self-pay | Admitting: Physician Assistant

## 2023-02-27 ENCOUNTER — Encounter: Payer: Self-pay | Admitting: Physician Assistant

## 2023-02-27 DIAGNOSIS — F909 Attention-deficit hyperactivity disorder, unspecified type: Secondary | ICD-10-CM

## 2023-02-27 LAB — URINE CULTURE

## 2023-02-27 MED ORDER — AMPHETAMINE-DEXTROAMPHET ER 30 MG PO CP24: 30.0000 mg | ORAL_CAPSULE | ORAL | 0 refills | Status: DC

## 2023-03-01 LAB — IGP, APTIMA HPV, RFX 16/18,45
HPV Aptima: NEGATIVE
PAP Smear Comment: 0

## 2023-03-10 ENCOUNTER — Other Ambulatory Visit: Payer: Self-pay | Admitting: Physician Assistant

## 2023-03-10 DIAGNOSIS — E1165 Type 2 diabetes mellitus with hyperglycemia: Secondary | ICD-10-CM

## 2023-04-01 ENCOUNTER — Other Ambulatory Visit: Payer: Self-pay | Admitting: Physician Assistant

## 2023-04-01 DIAGNOSIS — F909 Attention-deficit hyperactivity disorder, unspecified type: Secondary | ICD-10-CM

## 2023-04-03 MED ORDER — AMPHETAMINE-DEXTROAMPHET ER 30 MG PO CP24
30.0000 mg | ORAL_CAPSULE | ORAL | 0 refills | Status: DC
Start: 2023-04-03 — End: 2023-04-30

## 2023-04-06 ENCOUNTER — Other Ambulatory Visit: Payer: Self-pay | Admitting: Family Medicine

## 2023-04-06 DIAGNOSIS — D509 Iron deficiency anemia, unspecified: Secondary | ICD-10-CM

## 2023-04-20 ENCOUNTER — Encounter

## 2023-04-30 ENCOUNTER — Other Ambulatory Visit: Payer: Self-pay | Admitting: Physician Assistant

## 2023-04-30 DIAGNOSIS — F909 Attention-deficit hyperactivity disorder, unspecified type: Secondary | ICD-10-CM

## 2023-05-01 MED ORDER — AMPHETAMINE-DEXTROAMPHET ER 30 MG PO CP24: 30.0000 mg | ORAL_CAPSULE | ORAL | 0 refills | Status: DC

## 2023-05-04 ENCOUNTER — Other Ambulatory Visit: Payer: Self-pay | Admitting: Physician Assistant

## 2023-05-04 DIAGNOSIS — E1165 Type 2 diabetes mellitus with hyperglycemia: Secondary | ICD-10-CM

## 2023-06-05 ENCOUNTER — Other Ambulatory Visit: Payer: Self-pay | Admitting: Family Medicine

## 2023-06-06 ENCOUNTER — Other Ambulatory Visit: Payer: Self-pay

## 2023-06-06 DIAGNOSIS — F909 Attention-deficit hyperactivity disorder, unspecified type: Secondary | ICD-10-CM

## 2023-06-06 MED ORDER — AMPHETAMINE-DEXTROAMPHET ER 30 MG PO CP24: 30.0000 mg | ORAL_CAPSULE | ORAL | 0 refills | Status: DC

## 2023-06-22 DIAGNOSIS — F4322 Adjustment disorder with anxiety: Secondary | ICD-10-CM | POA: Diagnosis not present

## 2023-06-28 ENCOUNTER — Ambulatory Visit: Payer: BC Managed Care – PPO | Admitting: Physician Assistant

## 2023-06-28 ENCOUNTER — Encounter: Payer: Self-pay | Admitting: Physician Assistant

## 2023-06-28 VITALS — BP 116/80 | HR 70 | Temp 97.8°F | Resp 18 | Ht 68.0 in | Wt 214.4 lb

## 2023-06-28 DIAGNOSIS — E782 Mixed hyperlipidemia: Secondary | ICD-10-CM | POA: Diagnosis not present

## 2023-06-28 DIAGNOSIS — D509 Iron deficiency anemia, unspecified: Secondary | ICD-10-CM

## 2023-06-28 DIAGNOSIS — F909 Attention-deficit hyperactivity disorder, unspecified type: Secondary | ICD-10-CM

## 2023-06-28 DIAGNOSIS — R110 Nausea: Secondary | ICD-10-CM

## 2023-06-28 DIAGNOSIS — E1165 Type 2 diabetes mellitus with hyperglycemia: Secondary | ICD-10-CM

## 2023-06-28 DIAGNOSIS — F418 Other specified anxiety disorders: Secondary | ICD-10-CM

## 2023-06-28 DIAGNOSIS — R5383 Other fatigue: Secondary | ICD-10-CM

## 2023-06-28 LAB — CBC WITH DIFFERENTIAL/PLATELET
Basophils Absolute: 0 10*3/uL (ref 0.0–0.2)
Basos: 0 %
EOS (ABSOLUTE): 0.2 10*3/uL (ref 0.0–0.4)
Eos: 2 %
Hematocrit: 43 % (ref 34.0–46.6)
Hemoglobin: 13.9 g/dL (ref 11.1–15.9)
Immature Grans (Abs): 0 10*3/uL (ref 0.0–0.1)
Immature Granulocytes: 0 %
Lymphocytes Absolute: 2.9 10*3/uL (ref 0.7–3.1)
Lymphs: 26 %
MCH: 29.4 pg (ref 26.6–33.0)
MCHC: 32.3 g/dL (ref 31.5–35.7)
MCV: 91 fL (ref 79–97)
Monocytes Absolute: 0.6 10*3/uL (ref 0.1–0.9)
Monocytes: 5 %
Neutrophils Absolute: 7.3 10*3/uL — ABNORMAL HIGH (ref 1.4–7.0)
Neutrophils: 67 %
Platelets: 405 10*3/uL (ref 150–450)
RBC: 4.73 x10E6/uL (ref 3.77–5.28)
RDW: 12.5 % (ref 11.7–15.4)
WBC: 11 10*3/uL — ABNORMAL HIGH (ref 3.4–10.8)

## 2023-06-28 LAB — COMPREHENSIVE METABOLIC PANEL WITH GFR
ALT: 21 IU/L (ref 0–32)
AST: 22 IU/L (ref 0–40)
Albumin: 4.4 g/dL (ref 3.9–4.9)
Alkaline Phosphatase: 96 IU/L (ref 44–121)
BUN/Creatinine Ratio: 15 (ref 9–23)
BUN: 10 mg/dL (ref 6–20)
Bilirubin Total: 0.4 mg/dL (ref 0.0–1.2)
CO2: 24 mmol/L (ref 20–29)
Calcium: 9.5 mg/dL (ref 8.7–10.2)
Chloride: 100 mmol/L (ref 96–106)
Creatinine, Ser: 0.66 mg/dL (ref 0.57–1.00)
Globulin, Total: 2.6 g/dL (ref 1.5–4.5)
Glucose: 97 mg/dL (ref 70–99)
Potassium: 4.7 mmol/L (ref 3.5–5.2)
Sodium: 139 mmol/L (ref 134–144)
Total Protein: 7 g/dL (ref 6.0–8.5)
eGFR: 119 mL/min/{1.73_m2} (ref >59)

## 2023-06-28 LAB — LIPID PANEL
Chol/HDL Ratio: 2.9 ratio (ref 0.0–4.4)
Cholesterol, Total: 164 mg/dL (ref 100–199)
HDL: 57 mg/dL (ref >39)
LDL Chol Calc (NIH): 86 mg/dL (ref 0–99)
Triglycerides: 117 mg/dL (ref 0–149)
VLDL Cholesterol Cal: 21 mg/dL (ref 5–40)

## 2023-06-28 LAB — TSH: TSH: 1.29 u[IU]/mL (ref 0.450–4.500)

## 2023-06-28 LAB — HEMOGLOBIN A1C
Est. average glucose Bld gHb Est-mCnc: 123 mg/dL
Hgb A1c MFr Bld: 5.9 % — ABNORMAL HIGH (ref 4.8–5.6)

## 2023-06-28 MED ORDER — AMPHETAMINE-DEXTROAMPHET ER 30 MG PO CP24
30.0000 mg | ORAL_CAPSULE | ORAL | 0 refills | Status: DC
Start: 2023-06-28 — End: 2023-08-07

## 2023-06-28 MED ORDER — ONDANSETRON 4 MG PO TBDP: ORAL_TABLET | ORAL | 0 refills | Status: DC

## 2023-06-28 NOTE — Progress Notes (Signed)
 Subjective:  Patient ID: Angela Powers, female    DOB: 22-Oct-1990  Age: 33 y.o. MRN: [id number]  Chief Complaint  Patient presents with   Medical Management of Chronic Issues    HPI Mixed hyperlipidemia  Pt presents with hyperlipidemia. Patient was diagnosed in 2022  Compliance with treatment has been good -The patient is compliant with medications, maintains a low cholesterol diet , follows up as directed , and maintains an exercise regimen . The patient denies experiencing any hypercholesterolemia related symptoms.  Is currently taking crestor  10mg  every day  Pt with anxiety with depression.  States her symptoms are well controlled with citalopram  and is currently on 30mg  daily dosing (she prefers 2 separate doses)  Pt with NIDDM - was diagnosed about 5-6 years ago.  Currently taking ozempic  1mg  weekly.  She is not taking glucose at home.  Is up to date with eye exam Due for labwork  Pt with ADD and is currently taking Adderall XR 30mg  - states medication is working well for her and voices no concerns  Pt with vit d def - is not taking a supplement at this time-- due for labwork  Pt has been diagnosed with iron  def anemia.  She had seen Dr Harles Lied in the past and received iron  infusions but now is just taking daily iron .  She has not noted any melena or hematochezia but states she has been more fatigued than usual     06/28/2023    8:57 AM 02/23/2023    9:04 AM 10/21/2022   10:56 AM 07/11/2022   10:02 AM 04/05/2022    3:53 PM  Depression screen PHQ 2/9  Decreased Interest 0 0 0 0 0  Down, Depressed, Hopeless 0 0 0 0 0  PHQ - 2 Score 0 0 0 0 0  Altered sleeping 1 0 0 1   Tired, decreased energy 0 0 2 0   Change in appetite 0 0 0 0   Feeling bad or failure about yourself  0 0 0 0   Trouble concentrating 0 0 1 2   Moving slowly or fidgety/restless 0 0 0 2   Suicidal thoughts  0 0 0   PHQ-9 Score 1 0 3 5   Difficult doing work/chores Not difficult at all Not  difficult at all Somewhat difficult Not difficult at all         12/28/2021   11:11 AM 07/11/2022   10:02 AM 10/21/2022   10:56 AM 02/23/2023    9:04 AM 06/28/2023    8:56 AM  Fall Risk  Falls in the past year? 0 0 0 0 0  Was there an injury with Fall? 0 0 0 0 0  Fall Risk Category Calculator 0 0 0 0 0  Fall Risk Category (Retired) Low       (RETIRED) Patient Fall Risk Level Low fall risk       Patient at Risk for Falls Due to No Fall Risks No Fall Risks No Fall Risks No Fall Risks No Fall Risks  Fall risk Follow up Falls evaluation completed  Falls evaluation completed Falls evaluation completed Falls evaluation completed Falls evaluation completed     Data saved with a previous flowsheet row definition    CONSTITUTIONAL: see HPI E/N/T: Negative for ear pain, nasal congestion and sore throat.  CARDIOVASCULAR: Negative for chest pain, dizziness, palpitations and pedal edema.  RESPIRATORY: Negative for recent cough and dyspnea.  GASTROINTESTINAL: Negative for abdominal pain, acid reflux symptoms, constipation,  diarrhea, nausea and vomiting.  MSK: Negative for arthralgias and myalgias.  INTEGUMENTARY: Negative for rash.  NEUROLOGICAL: Negative for dizziness and headaches.  PSYCHIATRIC: Negative for sleep disturbance and to question depression screen.  Negative for depression, negative for anhedonia.       Current Outpatient Medications:    citalopram  (CELEXA ) 10 MG tablet, Take 1 tablet (10 mg total) by mouth daily. Take with Citalopram  20 mg to equal 30 mg., Disp: 90 tablet, Rfl: 1   citalopram  (CELEXA ) 20 MG tablet, Take 1 tablet (20 mg total) by mouth daily., Disp: 90 tablet, Rfl: 1   ferrous sulfate  325 (65 FE) MG tablet, TAKE 1 TABLET BY MOUTH DAILY, Disp: 90 tablet, Rfl: 0   rosuvastatin  (CRESTOR ) 10 MG tablet, TAKE 1 TABLET BY MOUTH EVERY DAY, Disp: 90 tablet, Rfl: 1   Semaglutide , 1 MG/DOSE, (OZEMPIC , 1 MG/DOSE,) 4 MG/3ML SOPN, INJECT 1 MG ONCE A WEEK AS DIRECTED, Disp: 3 mL,  Rfl: 1   amphetamine -dextroamphetamine  (ADDERALL XR) 30 MG 24 hr capsule, Take 1 capsule (30 mg total) by mouth every morning., Disp: 30 capsule, Rfl: 0   ondansetron  (ZOFRAN -ODT) 4 MG disintegrating tablet, TAKE 1 TABLET BY MOUTH EVERY 8 HOURS AS NEEDED FOR NAUSEA AND VOMITING, Disp: 30 tablet, Rfl: 0  Past Medical History:  Diagnosis Date   Allergy    Anemia    Anxiety    Depression    Diabetes mellitus without complication (HCC)    Migraine headache    Nephrolithiasis    Objective:  PHYSICAL EXAM:   VS: BP 116/80   Pulse 70   Temp 97.8 F (36.6 C) (Temporal)   Resp 18   Ht 5' 8 (1.727 m)   Wt 214 lb 6.4 oz (97.3 kg)   LMP 06/14/2023 (Exact Date)   SpO2 97%   BMI 32.60 kg/m   GEN: Well nourished, well developed, in no acute distress  Cardiac: RRR; no murmurs, rubs, or gallops,no edema - Respiratory:  normal respiratory rate and pattern with no distress - normal breath sounds with no rales, rhonchi, wheezes or rubs MS: no deformity or atrophy  Skin: warm and dry, no rash  Neuro:  Alert and Oriented x 3, - CN II-Xii grossly intact Psych: euthymic mood, appropriate affect and demeanor  Assessment & Plan:    Type 2 diabetes mellitus with hyperglycemia, without long-term current use of insulin  (HCC) -     CBC with Differential/Platelet -     Comprehensive metabolic panel -     TSH -     Hemoglobin A1c -  Continue current meds Vitamin D  deficiency -     VITAMIN D  25 Hydroxy (Vit-D Deficiency, Fractures)  Generalized anxiety disorder Continue citalopram  30mg  qd Adult ADHD -     Amphetamine -Dextroamphetamine ; Take 1 tablet (30 mg total) by mouth daily.  Dispense: 30 tablet; Refill: 0  Mixed hyperlipidemia -     Comprehensive metabolic panel -     Lipid panel Watch diet Continue crestor  Iron  deficiency anemia, unspecified iron  deficiency anemia type Continue supplement  Hemoccult cards given Iron  studies pending    Follow-up: Return in about 4 months (around  10/28/2023) for chronic fasting follow-up.  An After Visit Summary was printed and given to the patient.  Anthonette Bastos [facility] [phone]

## 2023-06-29 ENCOUNTER — Ambulatory Visit: Payer: Self-pay | Admitting: Physician Assistant

## 2023-06-29 LAB — MICROALBUMIN / CREATININE URINE RATIO
Creatinine, Urine: 424 mg/dL
Microalb/Creat Ratio: 9 mg/g{creat} (ref 0–29)
Microalbumin, Urine: 39.6 ug/mL

## 2023-06-29 LAB — IRON,TIBC AND FERRITIN PANEL
Ferritin: 70 ng/mL (ref 15–150)
Iron Saturation: 38 % (ref 15–55)
Iron: 123 ug/dL (ref 27–159)
Total Iron Binding Capacity: 323 ug/dL (ref 250–450)
UIBC: 200 ug/dL (ref 131–425)

## 2023-06-30 ENCOUNTER — Other Ambulatory Visit: Payer: Self-pay | Admitting: Physician Assistant

## 2023-06-30 DIAGNOSIS — E1165 Type 2 diabetes mellitus with hyperglycemia: Secondary | ICD-10-CM

## 2023-07-05 ENCOUNTER — Other Ambulatory Visit: Payer: Self-pay | Admitting: Physician Assistant

## 2023-07-05 DIAGNOSIS — D509 Iron deficiency anemia, unspecified: Secondary | ICD-10-CM

## 2023-07-05 DIAGNOSIS — F4322 Adjustment disorder with anxiety: Secondary | ICD-10-CM | POA: Diagnosis not present

## 2023-07-06 DIAGNOSIS — F4322 Adjustment disorder with anxiety: Secondary | ICD-10-CM | POA: Diagnosis not present

## 2023-07-07 ENCOUNTER — Ambulatory Visit

## 2023-07-07 ENCOUNTER — Telehealth: Payer: Self-pay

## 2023-07-07 VITALS — BP 100/80 | HR 72 | Temp 97.6°F | Ht 68.0 in | Wt 215.2 lb

## 2023-07-07 DIAGNOSIS — Z30017 Encounter for initial prescription of implantable subdermal contraceptive: Secondary | ICD-10-CM | POA: Diagnosis not present

## 2023-07-07 LAB — POCT URINE PREGNANCY: Preg Test, Ur: NEGATIVE

## 2023-07-07 NOTE — Telephone Encounter (Signed)
 Hemo cult result as follows:  07/01/2023 - positive 07/02/2023 - negative 07/05/2023 - negative.

## 2023-07-07 NOTE — Progress Notes (Signed)
 Subjective:  Patient ID: Angela Powers, female    DOB: 28-Apr-1990  Age: 33 y.o. MRN: [id number]  Chief Complaint  Patient presents with   nexplanon insertion    HPI:  Discussed the use of AI scribe software for clinical note transcription with the patient, who gave verbal consent to proceed.  History of Present Illness   Angela Powers is a 33 year old female who presents for Nexplanon insertion.  Contraceptive management - Requesting Nexplanon insertion for contraception - No prior Nexplanon implant - Negative pregnancy test prior to procedure      07/07/2023    9:56 AM 06/28/2023    8:57 AM 02/23/2023    9:04 AM 10/21/2022   10:56 AM 07/11/2022   10:02 AM  Depression screen PHQ 2/9  Decreased Interest 0 0 0 0 0  Down, Depressed, Hopeless 0 0 0 0 0  PHQ - 2 Score 0 0 0 0 0  Altered sleeping  1 0 0 1  Tired, decreased energy  0 0 2 0  Change in appetite  0 0 0 0  Feeling bad or failure about yourself   0 0 0 0  Trouble concentrating  0 0 1 2  Moving slowly or fidgety/restless  0 0 0 2  Suicidal thoughts   0 0 0  PHQ-9 Score  1 0 3 5  Difficult doing work/chores  Not difficult at all Not difficult at all Somewhat difficult Not difficult at all        07/07/2023    9:56 AM  Fall Risk   Falls in the past year? 0  Number falls in past yr: 0  Injury with Fall? 0  Risk for fall due to : No Fall Risks    Patient Care Team: Nicholaus Credit, PA-C as PCP - General (Physician Assistant)   Review of Systems  Constitutional:  Negative for chills, fatigue and fever.  HENT:  Negative for congestion, ear pain and sore throat.   Respiratory:  Negative for cough and shortness of breath.   Cardiovascular:  Negative for chest pain.  Gastrointestinal:  Negative for abdominal pain, constipation, diarrhea, nausea and vomiting.  Genitourinary:  Negative for dysuria and frequency.  Musculoskeletal:  Negative for arthralgias and myalgias.  Neurological:   Negative for dizziness and headaches.  Psychiatric/Behavioral:  Negative for dysphoric mood. The patient is not nervous/anxious.     Current Outpatient Medications on File Prior to Visit  Medication Sig Dispense Refill   amphetamine -dextroamphetamine  (ADDERALL XR) 30 MG 24 hr capsule Take 1 capsule (30 mg total) by mouth every morning. 30 capsule 0   citalopram  (CELEXA ) 10 MG tablet Take 1 tablet (10 mg total) by mouth daily. Take with Citalopram  20 mg to equal 30 mg. 90 tablet 1   citalopram  (CELEXA ) 20 MG tablet Take 1 tablet (20 mg total) by mouth daily. 90 tablet 1   etonogestrel (NEXPLANON) 68 MG IMPL implant 1 each by Subdermal route once. Inserted 06.27.2025 SN [id number] EXP: 06.2027 LOT 1: [id number]     ferrous sulfate  325 (65 FE) MG tablet TAKE 1 TABLET BY MOUTH EVERY DAY 90 tablet 0   ondansetron  (ZOFRAN -ODT) 4 MG disintegrating tablet TAKE 1 TABLET BY MOUTH EVERY 8 HOURS AS NEEDED FOR NAUSEA AND VOMITING 30 tablet 0   rosuvastatin  (CRESTOR ) 10 MG tablet TAKE 1 TABLET BY MOUTH EVERY DAY 90 tablet 1   Semaglutide , 1 MG/DOSE, (OZEMPIC , 1 MG/DOSE,) 4 MG/3ML SOPN INJECT 1 MG ONCE A WEEK  AS DIRECTED 3 mL 1   No current facility-administered medications on file prior to visit.   Past Medical History:  Diagnosis Date   Allergy    Anemia    Anxiety    Depression    Diabetes mellitus without complication (HCC)    Migraine headache    Nephrolithiasis    Past Surgical History:  Procedure Laterality Date   CESAREAN SECTION     CESAREAN SECTION MULTI-GESTATIONAL N/A 02/26/2018   Procedure: CESAREAN SECTION MULTI-GESTATIONAL;  Surgeon: Dannielle Bouchard, DO;  Location: [facility];  Service: Obstetrics;  Laterality: N/A;    Family History  Problem Relation Age of Onset   Mental illness Mother    Anxiety disorder Mother    Depression Mother    Hypertension Maternal Grandmother    Breast cancer Maternal Grandmother    Anxiety disorder Sister    Depression Sister     Social History   Socioeconomic History   Marital status: Married    Spouse name: ALEX   Number of children: 2   Years of education: 12 + 4   Highest education level: Bachelor's degree (e.g., BA, AB, BS)  Occupational History   Occupation: 1rst grade teacher  Tobacco Use   Smoking status: Never   Smokeless tobacco: Never  Vaping Use   Vaping status: Never Used  Substance and Sexual Activity   Alcohol use: No   Drug use: No   Sexual activity: Yes    Birth control/protection: Condom, None  Other Topics Concern   Not on file  Social History Narrative   Not on file   Social Drivers of Health   Financial Resource Strain: Low Risk  (06/27/2023)   Overall Financial Resource Strain (CARDIA)    Difficulty of Paying Living Expenses: Not hard at all  Food Insecurity: No Food Insecurity (06/27/2023)   Hunger Vital Sign    Worried About Running Out of Food in the Last Year: Never true    Ran Out of Food in the Last Year: Never true  Transportation Needs: No Transportation Needs (06/27/2023)   PRAPARE - Administrator, Civil Service (Medical): No    Lack of Transportation (Non-Medical): No  Physical Activity: Insufficiently Active (06/27/2023)   Exercise Vital Sign    Days of Exercise per Week: 2 days    Minutes of Exercise per Session: 20 min  Stress: Stress Concern Present (06/27/2023)   [employer] of Occupational Health - Occupational Stress Questionnaire    Feeling of Stress: To some extent  Social Connections: Moderately Isolated (06/27/2023)   Social Connection and Isolation Panel    Frequency of Communication with Friends and Family: More than three times a week    Frequency of Social Gatherings with Friends and Family: Once a week    Attends Religious Services: Never    Diplomatic Services operational officer: No    Attends Engineer, structural: Not on file    Marital Status: Married    Objective:  BP 100/80   Pulse 72   Temp 97.6 F (36.4  C)   Ht 5' 8 (1.727 m)   Wt 215 lb 3.2 oz (97.6 kg)   LMP 06/14/2023 (Exact Date)   SpO2 98%   BMI 32.72 kg/m      07/07/2023    9:51 AM 06/28/2023    8:56 AM 02/23/2023    8:43 AM  BP/Weight  Systolic BP 100 116 110  Diastolic BP 80 80 80  Wt. (Lbs)  215.2 214.4 210  BMI 32.72 kg/m2 32.6 kg/m2 31.93 kg/m2    Physical Exam Vitals and nursing note reviewed.  HENT:     Head: Normocephalic and atraumatic.   Neurological:     General: No focal deficit present.     Mental Status: She is oriented to person, place, and time.         Lab Results  Component Value Date   WBC 11.0 (H) 06/28/2023   HGB 13.9 06/28/2023   HCT 43.0 06/28/2023   PLT 405 06/28/2023   GLUCOSE 97 06/28/2023   CHOL 164 06/28/2023   TRIG 117 06/28/2023   HDL 57 06/28/2023   LDLCALC 86 06/28/2023   ALT 21 06/28/2023   AST 22 06/28/2023   NA 139 06/28/2023   K 4.7 06/28/2023   CL 100 06/28/2023   CREATININE 0.66 06/28/2023   BUN 10 06/28/2023   CO2 24 06/28/2023   TSH 1.290 06/28/2023   HGBA1C 5.9 (H) 06/28/2023   MICROALBUR 30 04/07/2020      Assessment & Plan:  Nexplanon insertion Assessment & Plan: A 33 year old female is scheduled for Nexplanon contraceptive implant insertion in the left arm, approximately four fingers from the elbow. She is enthusiastic about the procedure. The process involves cleaning the area, administering lidocaine for anesthesia, and inserting the implant. She was informed about potential minor complications such as bruising and spotting, the implant's three-year effectiveness, low risk of infection, and the ability to locate the implant via x-ray if necessary. The procedure is quick, taking about 30 seconds, and does not require sutures. - Insert Nexplanon in left arm after thorough cleaning with alcohol swabs, 2 mL of lidocaine with epinephrine used to make a bleb at the insertion site for anesthesia. Nexplanon inserted subcutaneously without any complications.     - Provide post-procedure care instructions, including maintaining pressure on the site for at least a day and using ice if needed - Provide a card with the date of insertion and relevant information       No orders of the defined types were placed in this encounter.   No orders of the defined types were placed in this encounter.  Assessment and Plan            Follow-up: No follow-ups on file.   Rokia Bosket, MD [facility] [phone]

## 2023-07-07 NOTE — Assessment & Plan Note (Signed)
 A 33 year old female is scheduled for Nexplanon contraceptive implant insertion in the left arm, approximately four fingers from the elbow. She is enthusiastic about the procedure. The process involves cleaning the area, administering lidocaine for anesthesia, and inserting the implant. She was informed about potential minor complications such as bruising and spotting, the implant's three-year effectiveness, low risk of infection, and the ability to locate the implant via x-ray if necessary. The procedure is quick, taking about 30 seconds, and does not require sutures. - Insert Nexplanon in left arm after thorough cleaning with alcohol swabs, 2 mL of lidocaine with epinephrine used to make a bleb at the insertion site for anesthesia. Nexplanon inserted subcutaneously without any complications.    - Provide post-procedure care instructions, including maintaining pressure on the site for at least a day and using ice if needed - Provide a card with the date of insertion and relevant information

## 2023-07-10 NOTE — Telephone Encounter (Signed)
 Left message for patient to return call.

## 2023-07-11 DIAGNOSIS — F4322 Adjustment disorder with anxiety: Secondary | ICD-10-CM | POA: Diagnosis not present

## 2023-07-11 NOTE — Telephone Encounter (Signed)
 Left message for patient to return call.

## 2023-07-12 ENCOUNTER — Other Ambulatory Visit: Payer: Self-pay | Admitting: Physician Assistant

## 2023-07-12 DIAGNOSIS — D509 Iron deficiency anemia, unspecified: Secondary | ICD-10-CM

## 2023-07-12 DIAGNOSIS — R195 Other fecal abnormalities: Secondary | ICD-10-CM

## 2023-07-12 NOTE — Telephone Encounter (Signed)
 Referral made to [facility]

## 2023-07-21 DIAGNOSIS — F4322 Adjustment disorder with anxiety: Secondary | ICD-10-CM | POA: Diagnosis not present

## 2023-07-25 DIAGNOSIS — F4322 Adjustment disorder with anxiety: Secondary | ICD-10-CM | POA: Diagnosis not present

## 2023-07-28 DIAGNOSIS — F4322 Adjustment disorder with anxiety: Secondary | ICD-10-CM | POA: Diagnosis not present

## 2023-08-07 ENCOUNTER — Other Ambulatory Visit: Payer: Self-pay | Admitting: Physician Assistant

## 2023-08-07 DIAGNOSIS — F909 Attention-deficit hyperactivity disorder, unspecified type: Secondary | ICD-10-CM

## 2023-08-07 MED ORDER — AMPHETAMINE-DEXTROAMPHET ER 30 MG PO CP24: 30.0000 mg | ORAL_CAPSULE | ORAL | 0 refills | Status: DC

## 2023-08-07 NOTE — Telephone Encounter (Signed)
 Copied from CRM [id number]. Topic: Clinical - Medication Refill >> Aug 07, 2023  9:35 AM Everette C wrote: Medication: amphetamine -dextroamphetamine  (ADDERALL XR) 30 MG 24 hr capsule [[id number]]  Has the patient contacted their pharmacy? Yes (Agent: If no, request that the patient contact the pharmacy for the refill. If patient does not wish to contact the pharmacy document the reason why and proceed with request.) (Agent: If yes, when and what did the pharmacy advise?)  This is the patient's preferred pharmacy:  [id number] [place], [place] - [address] [place] [address] Phone: [phone] Fax: [phone]  Is this the correct pharmacy for this prescription? Yes If no, delete pharmacy and type the correct one.   Has the prescription been filled recently? Yes  Is the patient out of the medication? Yes  Has the patient been seen for an appointment in the last year OR does the patient have an upcoming appointment? Yes  Can we respond through MyChart? No  Agent: Please be advised that Rx refills may take up to 3 business days. We ask that you follow-up with your pharmacy.

## 2023-08-10 DIAGNOSIS — F4322 Adjustment disorder with anxiety: Secondary | ICD-10-CM | POA: Diagnosis not present

## 2023-08-12 ENCOUNTER — Encounter: Payer: Self-pay | Admitting: Physician Assistant

## 2023-08-14 DIAGNOSIS — F4322 Adjustment disorder with anxiety: Secondary | ICD-10-CM | POA: Diagnosis not present

## 2023-08-24 DIAGNOSIS — F4322 Adjustment disorder with anxiety: Secondary | ICD-10-CM | POA: Diagnosis not present

## 2023-08-25 ENCOUNTER — Other Ambulatory Visit: Payer: Self-pay | Admitting: Physician Assistant

## 2023-08-25 DIAGNOSIS — E1165 Type 2 diabetes mellitus with hyperglycemia: Secondary | ICD-10-CM

## 2023-08-28 DIAGNOSIS — F4322 Adjustment disorder with anxiety: Secondary | ICD-10-CM | POA: Diagnosis not present

## 2023-08-31 ENCOUNTER — Other Ambulatory Visit: Payer: Self-pay | Admitting: Physician Assistant

## 2023-08-31 DIAGNOSIS — F418 Other specified anxiety disorders: Secondary | ICD-10-CM

## 2023-09-08 ENCOUNTER — Other Ambulatory Visit: Payer: Self-pay | Admitting: Physician Assistant

## 2023-09-08 DIAGNOSIS — F909 Attention-deficit hyperactivity disorder, unspecified type: Secondary | ICD-10-CM

## 2023-09-08 MED ORDER — AMPHETAMINE-DEXTROAMPHET ER 30 MG PO CP24: 30.0000 mg | ORAL_CAPSULE | ORAL | 0 refills | Status: DC

## 2023-09-08 NOTE — Telephone Encounter (Signed)
 Copied from CRM #[id number]. Topic: Clinical - Medication Refill >> Sep 08, 2023  8:04 AM Donna E wrote:  Medication: amphetamine -dextroamphetamine  (ADDERALL XR) 30 MG 24 hr capsule  Has the patient contacted their pharmacy? Yes Patient sent a request to pharmacy 09/08/23 early morning  This is the patient's preferred pharmacy:   [id number] [place] [place], [place] - [address] [place] [address] Phone: [phone] Fax: [phone]   Is this the correct pharmacy for this prescription? Yes If no, delete pharmacy and type the correct one.   Has the prescription been filled recently? Yes  Is the patient out of the medication? Yes  Has the patient been seen for an appointment in the last year OR does the patient have an upcoming appointment? Yes  Can we respond through MyChart? Yes  Agent: Please be advised that Rx refills may take up to 3 business days. We ask that you follow-up with your pharmacy.

## 2023-09-12 DIAGNOSIS — F4322 Adjustment disorder with anxiety: Secondary | ICD-10-CM | POA: Diagnosis not present

## 2023-09-15 DIAGNOSIS — F4322 Adjustment disorder with anxiety: Secondary | ICD-10-CM | POA: Diagnosis not present

## 2023-09-17 ENCOUNTER — Other Ambulatory Visit: Payer: Self-pay | Admitting: Physician Assistant

## 2023-09-17 DIAGNOSIS — R110 Nausea: Secondary | ICD-10-CM

## 2023-09-25 DIAGNOSIS — F4322 Adjustment disorder with anxiety: Secondary | ICD-10-CM | POA: Diagnosis not present

## 2023-09-28 DIAGNOSIS — F4322 Adjustment disorder with anxiety: Secondary | ICD-10-CM | POA: Diagnosis not present

## 2023-09-30 ENCOUNTER — Other Ambulatory Visit: Payer: Self-pay | Admitting: Physician Assistant

## 2023-09-30 DIAGNOSIS — D509 Iron deficiency anemia, unspecified: Secondary | ICD-10-CM

## 2023-10-05 ENCOUNTER — Other Ambulatory Visit: Payer: Self-pay | Admitting: Physician Assistant

## 2023-10-05 DIAGNOSIS — F909 Attention-deficit hyperactivity disorder, unspecified type: Secondary | ICD-10-CM

## 2023-10-05 NOTE — Telephone Encounter (Signed)
 Copied from CRM [id number]. Topic: Clinical - Medication Refill >> Oct 05, 2023 11:30 AM Zebedee SAUNDERS wrote: Medication: amphetamine -dextroamphetamine  (ADDERALL XR) 30 MG 24 hr capsule  Has the patient contacted their pharmacy? Yes (Agent: If no, request that the patient contact the pharmacy for the refill. If patient does not wish to contact the pharmacy document the reason why and proceed with request.) (Agent: If yes, when and what did the pharmacy advise?)  This is the patient's preferred pharmacy:  [id number] [place], [place] - [address] [place] [address] Phone: [phone] Fax: [phone]  Is this the correct pharmacy for this prescription? Yes If no, delete pharmacy and type the correct one.   Has the prescription been filled recently? Yes  Is the patient out of the medication? Yes  Has the patient been seen for an appointment in the last year OR does the patient have an upcoming appointment? Yes  Can we respond through MyChart? Yes  Agent: Please be advised that Rx refills may take up to 3 business days. We ask that you follow-up with your pharmacy.

## 2023-10-08 MED ORDER — AMPHETAMINE-DEXTROAMPHET ER 30 MG PO CP24: 30.0000 mg | ORAL_CAPSULE | ORAL | 0 refills | Status: DC

## 2023-10-09 ENCOUNTER — Encounter: Payer: Self-pay | Admitting: Physician Assistant

## 2023-10-12 DIAGNOSIS — F4322 Adjustment disorder with anxiety: Secondary | ICD-10-CM | POA: Diagnosis not present

## 2023-10-16 DIAGNOSIS — F4322 Adjustment disorder with anxiety: Secondary | ICD-10-CM | POA: Diagnosis not present

## 2023-11-06 DIAGNOSIS — F4322 Adjustment disorder with anxiety: Secondary | ICD-10-CM | POA: Diagnosis not present

## 2023-11-07 ENCOUNTER — Ambulatory Visit: Admitting: Physician Assistant

## 2023-11-07 ENCOUNTER — Encounter: Payer: Self-pay | Admitting: Physician Assistant

## 2023-11-07 VITALS — BP 124/82 | HR 71 | Temp 97.8°F | Resp 18 | Ht 68.0 in | Wt 228.6 lb

## 2023-11-07 DIAGNOSIS — D509 Iron deficiency anemia, unspecified: Secondary | ICD-10-CM

## 2023-11-07 DIAGNOSIS — F418 Other specified anxiety disorders: Secondary | ICD-10-CM | POA: Diagnosis not present

## 2023-11-07 DIAGNOSIS — Z7985 Long-term (current) use of injectable non-insulin antidiabetic drugs: Secondary | ICD-10-CM

## 2023-11-07 DIAGNOSIS — R110 Nausea: Secondary | ICD-10-CM

## 2023-11-07 DIAGNOSIS — E559 Vitamin D deficiency, unspecified: Secondary | ICD-10-CM

## 2023-11-07 DIAGNOSIS — E1165 Type 2 diabetes mellitus with hyperglycemia: Secondary | ICD-10-CM | POA: Diagnosis not present

## 2023-11-07 DIAGNOSIS — F909 Attention-deficit hyperactivity disorder, unspecified type: Secondary | ICD-10-CM

## 2023-11-07 DIAGNOSIS — E782 Mixed hyperlipidemia: Secondary | ICD-10-CM

## 2023-11-07 MED ORDER — ONDANSETRON 4 MG PO TBDP: ORAL_TABLET | ORAL | 1 refills | Status: AC

## 2023-11-07 MED ORDER — AMPHETAMINE-DEXTROAMPHET ER 30 MG PO CP24: 30.0000 mg | ORAL_CAPSULE | ORAL | 0 refills | Status: DC

## 2023-11-07 MED ORDER — ROSUVASTATIN CALCIUM 10 MG PO TABS: 10.0000 mg | ORAL_TABLET | Freq: Every day | ORAL | 1 refills | Status: AC

## 2023-11-07 NOTE — Progress Notes (Signed)
 Subjective:  Patient ID: Angela Powers, female    DOB: Jan 07, 1991  Age: 33 y.o. MRN: [id number]  Chief Complaint  Patient presents with   Medical Management of Chronic Issues    HPI Mixed hyperlipidemia  Pt presents with hyperlipidemia. Patient was diagnosed in 2022  Compliance with treatment has been good -The patient is compliant with medications, maintains a low cholesterol diet , follows up as directed , and maintains an exercise regimen . The patient denies experiencing any hypercholesterolemia related symptoms.  Is currently taking crestor  10mg  every day  Pt with anxiety with depression.  States her symptoms are well controlled with citalopram  and is currently on 30mg  daily dosing (she prefers 2 separate doses)  Pt with NIDDM - was diagnosed about 5-6 years ago.  Currently taking ozempic  1mg  weekly.  She is not taking glucose at home.  Is up to date with eye exam Due for labwork  Pt with ADD and is currently taking Adderall XR 30mg  - states medication is working well for her and voices no concerns except that it does wear off in early afternoon on some days   Pt with vit d def - is not taking a supplement at this time-- due for labwork  Pt has been diagnosed with iron  def anemia.  She had seen Dr Ezzard in the past and received iron  infusions but now is just taking daily iron .  She has not noted any melena or hematochezia - has some mild fatigue     11/07/2023    9:07 AM 07/07/2023    9:56 AM 06/28/2023    8:57 AM 02/23/2023    9:04 AM 10/21/2022   10:56 AM  Depression screen PHQ 2/9  Decreased Interest 0 0 0 0 0  Down, Depressed, Hopeless 0 0 0 0 0  PHQ - 2 Score 0 0 0 0 0  Altered sleeping 0  1 0 0  Tired, decreased energy 0  0 0 2  Change in appetite 0  0 0 0  Feeling bad or failure about yourself  0  0 0 0  Trouble concentrating 0  0 0 1  Moving slowly or fidgety/restless 0  0 0 0  Suicidal thoughts 0   0 0  PHQ-9 Score 0  1 0 3  Difficult doing  work/chores Not difficult at all  Not difficult at all Not difficult at all Somewhat difficult        10/21/2022   10:56 AM 02/23/2023    9:04 AM 06/28/2023    8:56 AM 07/07/2023    9:56 AM 11/07/2023    9:07 AM  Fall Risk  Falls in the past year? 0 0 0 0 0  Was there an injury with Fall? 0 0 0 0 0  Fall Risk Category Calculator 0 0 0 0 0  Patient at Risk for Falls Due to No Fall Risks No Fall Risks No Fall Risks No Fall Risks No Fall Risks  Fall risk Follow up Falls evaluation completed Falls evaluation completed Falls evaluation completed  Falls evaluation completed    CONSTITUTIONAL: mild fatigue  E/N/T: Negative for ear pain, nasal congestion and sore throat.  CARDIOVASCULAR: Negative for chest pain, dizziness, palpitations and pedal edema.  RESPIRATORY: Negative for recent cough and dyspnea.  GASTROINTESTINAL: Negative for abdominal pain, acid reflux symptoms, constipation, diarrhea, nausea and vomiting.  MSK: Negative for arthralgias and myalgias.  INTEGUMENTARY: Negative for rash.  NEUROLOGICAL: Negative for dizziness and headaches.  PSYCHIATRIC: Negative for  sleep disturbance and to question depression screen.  Negative for depression, negative for anhedonia.        Current Outpatient Medications:    citalopram  (CELEXA ) 10 MG tablet, TAKE 1 TABLET (10 MG TOTAL) BY MOUTH DAILY. TAKE WITH CITALOPRAM  20 MG TO EQUAL 30 MG., Disp: 90 tablet, Rfl: 1   citalopram  (CELEXA ) 20 MG tablet, Take 1 tablet (20 mg total) by mouth daily., Disp: 90 tablet, Rfl: 1   etonogestrel (NEXPLANON) 68 MG IMPL implant, 1 each by Subdermal route once. Inserted 06.27.2025 SN [id number] EXP: 06.2027 LOT 1: [id number], Disp: , Rfl:    ferrous sulfate  325 (65 FE) MG tablet, TAKE 1 TABLET BY MOUTH EVERY DAY, Disp: 90 tablet, Rfl: 0   Semaglutide , 1 MG/DOSE, (OZEMPIC , 1 MG/DOSE,) 4 MG/3ML SOPN, INJECT 1 MG ONCE A WEEK AS DIRECTED, Disp: 9 mL, Rfl: 0   amphetamine -dextroamphetamine  (ADDERALL XR) 30  MG 24 hr capsule, Take 1 capsule (30 mg total) by mouth every morning., Disp: 30 capsule, Rfl: 0   ondansetron  (ZOFRAN -ODT) 4 MG disintegrating tablet, TAKE 1 TABLET BY MOUTH EVERY 8 HOURS AS NEEDED FOR NAUSEA AND VOMITING, Disp: 30 tablet, Rfl: 1   rosuvastatin  (CRESTOR ) 10 MG tablet, Take 1 tablet (10 mg total) by mouth daily., Disp: 90 tablet, Rfl: 1  Past Medical History:  Diagnosis Date   Allergy    Anemia    Anxiety    Depression    Diabetes mellitus without complication (HCC)    Migraine headache    Nephrolithiasis    Objective:  PHYSICAL EXAM:   VS: BP 124/82   Pulse 71   Temp 97.8 F (36.6 C) (Temporal)   Resp 18   Ht 5' 8 (1.727 m)   Wt 228 lb 9.6 oz (103.7 kg)   SpO2 100%   BMI 34.76 kg/m   GEN: Well nourished, well developed, in no acute distress  Cardiac: RRR; no murmurs, rubs, or gallops,no edema -  Respiratory:  normal respiratory rate and pattern with no distress - normal breath sounds with no rales, rhonchi, wheezes or rubs MS: no deformity or atrophy  Skin: warm and dry, no rash  Neuro:  Alert and Oriented x 3,- CN II-Xii grossly intact Psych: euthymic mood, appropriate affect and demeanor   Assessment & Plan:    Type 2 diabetes mellitus with hyperglycemia, without long-term current use of insulin  (HCC) -     CBC with Differential/Platelet -     Comprehensive metabolic panel -     TSH -     Hemoglobin A1c - continue to watch diet Continue current meds Vitamin D  deficiency -     VITAMIN D  25 Hydroxy (Vit-D Deficiency, Fractures)  Anxiety with depression Continue citalopram  30mg  qd Adult ADHD -     Amphetamine -Dextroamphetamine ; Take 1 tablet (30 mg total) by mouth daily.  Dispense: 30 tablet; Refill: 0  Mixed hyperlipidemia -     Comprehensive metabolic panel -     Lipid panel Watch diet Continue crestor  Iron  deficiency anemia, unspecified iron  deficiency anemia type Continue supplement  Iron  studies pending    Follow-up: Return in  about 4 months (around 03/09/2024) for chronic fasting follow-up.  An After Visit Summary was printed and given to the patient.  Angela Powers [facility] [phone]

## 2023-11-16 DIAGNOSIS — F4322 Adjustment disorder with anxiety: Secondary | ICD-10-CM | POA: Diagnosis not present

## 2023-11-17 DIAGNOSIS — E1165 Type 2 diabetes mellitus with hyperglycemia: Secondary | ICD-10-CM | POA: Diagnosis not present

## 2023-11-17 DIAGNOSIS — E782 Mixed hyperlipidemia: Secondary | ICD-10-CM | POA: Diagnosis not present

## 2023-11-17 DIAGNOSIS — E559 Vitamin D deficiency, unspecified: Secondary | ICD-10-CM | POA: Diagnosis not present

## 2023-11-17 DIAGNOSIS — D509 Iron deficiency anemia, unspecified: Secondary | ICD-10-CM | POA: Diagnosis not present

## 2023-11-18 LAB — COMPREHENSIVE METABOLIC PANEL WITH GFR
ALT: 16 IU/L (ref 0–32)
AST: 21 IU/L (ref 0–40)
Albumin: 4.4 g/dL (ref 3.9–4.9)
Alkaline Phosphatase: 80 IU/L (ref 41–116)
BUN/Creatinine Ratio: 19 (ref 9–23)
BUN: 13 mg/dL (ref 6–20)
Bilirubin Total: 0.3 mg/dL (ref 0.0–1.2)
CO2: 25 mmol/L (ref 20–29)
Calcium: 9.7 mg/dL (ref 8.7–10.2)
Chloride: 99 mmol/L (ref 96–106)
Creatinine, Ser: 0.68 mg/dL (ref 0.57–1.00)
Globulin, Total: 2.8 g/dL (ref 1.5–4.5)
Glucose: 85 mg/dL (ref 70–99)
Potassium: 4.7 mmol/L (ref 3.5–5.2)
Sodium: 137 mmol/L (ref 134–144)
Total Protein: 7.2 g/dL (ref 6.0–8.5)
eGFR: 118 mL/min/1.73 (ref >59)

## 2023-11-18 LAB — FE+CBC/D/PLT+TIBC+FER+RETIC
Basophils Absolute: 0 x10E3/uL (ref 0.0–0.2)
Basos: 0 %
EOS (ABSOLUTE): 0.2 x10E3/uL (ref 0.0–0.4)
Eos: 1 %
Ferritin: 69 ng/mL (ref 15–150)
Hematocrit: 40.7 % (ref 34.0–46.6)
Hemoglobin: 13 g/dL (ref 11.1–15.9)
Immature Grans (Abs): 0 x10E3/uL (ref 0.0–0.1)
Immature Granulocytes: 0 %
Iron Saturation: 14 % — ABNORMAL LOW (ref 15–55)
Iron: 51 ug/dL (ref 27–159)
Lymphocytes Absolute: 3.6 x10E3/uL — ABNORMAL HIGH (ref 0.7–3.1)
Lymphs: 29 %
MCH: 28.6 pg (ref 26.6–33.0)
MCHC: 31.9 g/dL (ref 31.5–35.7)
MCV: 90 fL (ref 79–97)
Monocytes Absolute: 0.7 x10E3/uL (ref 0.1–0.9)
Monocytes: 6 %
Neutrophils Absolute: 7.6 x10E3/uL — ABNORMAL HIGH (ref 1.4–7.0)
Neutrophils: 64 %
Platelets: 423 x10E3/uL (ref 150–450)
RBC: 4.54 x10E6/uL (ref 3.77–5.28)
RDW: 12.4 % (ref 11.7–15.4)
Retic Ct Pct: 1.8 % (ref 0.6–2.6)
Total Iron Binding Capacity: 362 ug/dL (ref 250–450)
UIBC: 311 ug/dL (ref 131–425)
WBC: 12.1 x10E3/uL — ABNORMAL HIGH (ref 3.4–10.8)

## 2023-11-18 LAB — LIPID PANEL
Chol/HDL Ratio: 3.4 ratio (ref 0.0–4.4)
Cholesterol, Total: 169 mg/dL (ref 100–199)
HDL: 50 mg/dL (ref >39)
LDL Chol Calc (NIH): 101 mg/dL — ABNORMAL HIGH (ref 0–99)
Triglycerides: 96 mg/dL (ref 0–149)
VLDL Cholesterol Cal: 18 mg/dL (ref 5–40)

## 2023-11-18 LAB — VITAMIN D 25 HYDROXY (VIT D DEFICIENCY, FRACTURES): Vit D, 25-Hydroxy: 22.4 ng/mL — ABNORMAL LOW (ref 30.0–100.0)

## 2023-11-18 LAB — HEMOGLOBIN A1C
Est. average glucose Bld gHb Est-mCnc: 131 mg/dL
Hgb A1c MFr Bld: 6.2 % — ABNORMAL HIGH (ref 4.8–5.6)

## 2023-11-20 ENCOUNTER — Other Ambulatory Visit: Payer: Self-pay | Admitting: Physician Assistant

## 2023-11-20 ENCOUNTER — Ambulatory Visit: Payer: Self-pay | Admitting: Physician Assistant

## 2023-11-20 DIAGNOSIS — E559 Vitamin D deficiency, unspecified: Secondary | ICD-10-CM

## 2023-11-20 DIAGNOSIS — E1165 Type 2 diabetes mellitus with hyperglycemia: Secondary | ICD-10-CM

## 2023-11-20 MED ORDER — VITAMIN D (ERGOCALCIFEROL) 1.25 MG (50000 UNIT) PO CAPS: 50000.0000 [IU] | ORAL_CAPSULE | ORAL | 5 refills | Status: AC

## 2023-11-25 ENCOUNTER — Other Ambulatory Visit: Payer: Self-pay | Admitting: Physician Assistant

## 2023-11-25 DIAGNOSIS — F418 Other specified anxiety disorders: Secondary | ICD-10-CM

## 2023-12-04 DIAGNOSIS — F4322 Adjustment disorder with anxiety: Secondary | ICD-10-CM | POA: Diagnosis not present

## 2023-12-11 ENCOUNTER — Telehealth: Payer: Self-pay | Admitting: Physician Assistant

## 2023-12-11 DIAGNOSIS — F909 Attention-deficit hyperactivity disorder, unspecified type: Secondary | ICD-10-CM

## 2023-12-11 NOTE — Telephone Encounter (Unsigned)
 Copied from CRM [id number]. Topic: Clinical - Medication Refill >> Dec 11, 2023  9:53 AM Angela Powers wrote: Medication: amphetamine -dextroamphetamine  (ADDERALL XR) 30 MG 24 hr capsule  Has the patient contacted their pharmacy? Yes (Agent: If no, request that the patient contact the pharmacy for the refill. If patient does not wish to contact the pharmacy document the reason why and proceed with request.) (Agent: If yes, when and what did the pharmacy advise?) said to call  This is the patient's preferred pharmacy:  [id number] [place], [place] - [address] Angela [place] [address] Phone: [phone] Fax: [phone]  Is this the correct pharmacy for this prescription? Yes If no, delete pharmacy and type the correct one.   Has the prescription been filled recently? No  Is the patient out of the medication? Yes  Has the patient been seen for an appointment in the last year OR does the patient have an upcoming appointment? Yes  Can we respond through MyChart? Yes  Agent: Please be advised that Rx refills may take up to 3 business days. We ask that you follow-up with your pharmacy.

## 2023-12-12 MED ORDER — AMPHETAMINE-DEXTROAMPHET ER 30 MG PO CP24: 30.0000 mg | ORAL_CAPSULE | ORAL | 0 refills | Status: DC

## 2024-01-04 ENCOUNTER — Other Ambulatory Visit: Payer: Self-pay | Admitting: Physician Assistant

## 2024-01-04 DIAGNOSIS — D509 Iron deficiency anemia, unspecified: Secondary | ICD-10-CM

## 2024-01-15 ENCOUNTER — Other Ambulatory Visit: Payer: Self-pay | Admitting: Physician Assistant

## 2024-01-15 DIAGNOSIS — F909 Attention-deficit hyperactivity disorder, unspecified type: Secondary | ICD-10-CM

## 2024-01-15 MED ORDER — AMPHETAMINE-DEXTROAMPHET ER 30 MG PO CP24: 30.0000 mg | ORAL_CAPSULE | ORAL | 0 refills | Status: DC

## 2024-01-15 NOTE — Telephone Encounter (Signed)
 Copied from CRM [id number]. Topic: Clinical - Medication Refill >> Jan 15, 2024  8:32 AM Deleta RAMAN wrote: Medication: amphetamine -dextroamphetamine  (ADDERALL XR) 30 MG 24 hr capsule  Has the patient contacted their pharmacy?  (AgeNont: If no, request that the patient contact the pharmacy for the refill. If patient does not wish to contact the pharmacy document the reason why and proceed with request.) (Agent: If yes, when and what did the pharmacy advise?)  This is the patient's preferred pharmacy:  [id number] [place], [place] - [address] [place] [address] Phone: [phone] Fax: [phone]  Is this the correct pharmacy for this prescription? Yes If no, delete pharmacy and type the correct one.   Has the prescription been filled recently? Yes  Is the patient out of the medication? Yes  Has the patient been seen for an appointment in the last year OR does the patient have an upcoming appointment? Yes  Can we respond through MyChart? Yes  Agent: Please be advised that Rx refills may take up to 3 business days. We ask that you follow-up with your pharmacy.

## 2024-01-21 ENCOUNTER — Other Ambulatory Visit: Payer: Self-pay | Admitting: Physician Assistant

## 2024-01-21 DIAGNOSIS — E1165 Type 2 diabetes mellitus with hyperglycemia: Secondary | ICD-10-CM

## 2024-02-16 ENCOUNTER — Other Ambulatory Visit: Payer: Self-pay

## 2024-02-16 DIAGNOSIS — F909 Attention-deficit hyperactivity disorder, unspecified type: Secondary | ICD-10-CM

## 2024-02-16 MED ORDER — AMPHETAMINE-DEXTROAMPHET ER 30 MG PO CP24: 30.0000 mg | ORAL_CAPSULE | ORAL | 0 refills | Status: AC

## 2024-02-16 NOTE — Telephone Encounter (Signed)
 Copied from CRM [id number]. Topic: Clinical - Medication Refill >> Feb 16, 2024  9:59 AM Sophia H wrote: Medication: amphetamine -dextroamphetamine  (ADDERALL XR) 30 MG 24 hr capsule   Has the patient contacted their pharmacy? Yes, has to contact office every fill.   This is the patient's preferred pharmacy:  [id number] [place], [place] - [address] [place] [address] Phone: [phone] Fax: [phone]  Is this the correct pharmacy for this prescription? Yes If no, delete pharmacy and type the correct one.   Has the prescription been filled recently? Yes  Is the patient out of the medication? Yes, took last one today   Has the patient been seen for an appointment in the last year OR does the patient have an upcoming appointment? Yes, appt March 2   Can we respond through MyChart? Yes  Agent: Please be advised that Rx refills may take up to 3 business days. We ask that you follow-up with your pharmacy.

## 2024-02-26 ENCOUNTER — Encounter: Payer: BC Managed Care – PPO | Admitting: Physician Assistant

## 2024-03-11 ENCOUNTER — Encounter: Admitting: Physician Assistant
